# Patient Record
Sex: Female | Born: 1955
Health system: Southern US, Community
[De-identification: ages and names within clinical notes are randomized; demographics above are authoritative.]

## PROBLEM LIST (undated history)

## (undated) DIAGNOSIS — I10 Essential (primary) hypertension: Secondary | ICD-10-CM

## (undated) DIAGNOSIS — R112 Nausea with vomiting, unspecified: Secondary | ICD-10-CM

## (undated) DIAGNOSIS — E785 Hyperlipidemia, unspecified: Secondary | ICD-10-CM

## (undated) DIAGNOSIS — K635 Polyp of colon: Secondary | ICD-10-CM

## (undated) DIAGNOSIS — Z9889 Other specified postprocedural states: Secondary | ICD-10-CM

## (undated) DIAGNOSIS — M1990 Unspecified osteoarthritis, unspecified site: Secondary | ICD-10-CM

## (undated) HISTORY — DX: Essential (primary) hypertension: I10

## (undated) HISTORY — PX: LESION REMOVAL: SHX5196

## (undated) HISTORY — PX: CATARACT EXTRACTION, BILATERAL: SHX1313

## (undated) HISTORY — DX: Polyp of colon: K63.5

## (undated) HISTORY — DX: Hyperlipidemia, unspecified: E78.5

---

## 1993-12-08 HISTORY — PX: ABDOMINOPLASTY: SUR9

## 2006-06-16 ENCOUNTER — Ambulatory Visit: Payer: Self-pay | Admitting: Gastroenterology

## 2009-03-08 ENCOUNTER — Ambulatory Visit: Payer: Self-pay | Admitting: Gynecologic Oncology

## 2009-03-27 ENCOUNTER — Ambulatory Visit: Payer: Self-pay | Admitting: Gynecologic Oncology

## 2009-10-08 ENCOUNTER — Ambulatory Visit: Payer: Self-pay | Admitting: Gynecologic Oncology

## 2009-10-09 ENCOUNTER — Ambulatory Visit: Payer: Self-pay | Admitting: Gynecologic Oncology

## 2009-11-07 ENCOUNTER — Ambulatory Visit: Payer: Self-pay | Admitting: Gynecologic Oncology

## 2010-04-07 ENCOUNTER — Ambulatory Visit: Payer: Self-pay | Admitting: Gynecologic Oncology

## 2010-04-09 ENCOUNTER — Ambulatory Visit: Payer: Self-pay | Admitting: Gynecologic Oncology

## 2010-05-08 ENCOUNTER — Ambulatory Visit: Payer: Self-pay | Admitting: Gynecologic Oncology

## 2010-10-22 ENCOUNTER — Ambulatory Visit: Payer: Self-pay | Admitting: Gynecologic Oncology

## 2010-11-07 ENCOUNTER — Ambulatory Visit: Payer: Self-pay | Admitting: Gynecologic Oncology

## 2011-03-10 ENCOUNTER — Ambulatory Visit: Payer: Self-pay | Admitting: Surgery

## 2012-03-09 ENCOUNTER — Ambulatory Visit: Payer: Self-pay | Admitting: Gynecologic Oncology

## 2012-04-07 ENCOUNTER — Ambulatory Visit: Payer: Self-pay | Admitting: Gynecologic Oncology

## 2012-09-07 ENCOUNTER — Ambulatory Visit: Payer: Self-pay | Admitting: Gynecologic Oncology

## 2012-10-08 ENCOUNTER — Ambulatory Visit: Payer: Self-pay | Admitting: Gynecologic Oncology

## 2012-11-03 ENCOUNTER — Ambulatory Visit: Payer: Self-pay | Admitting: Unknown Physician Specialty

## 2012-11-03 LAB — HM COLONOSCOPY: HM Colonoscopy: NORMAL

## 2013-03-08 ENCOUNTER — Ambulatory Visit: Payer: Self-pay | Admitting: Gynecologic Oncology

## 2013-04-07 ENCOUNTER — Ambulatory Visit: Payer: Self-pay | Admitting: Gynecologic Oncology

## 2013-09-05 ENCOUNTER — Ambulatory Visit: Payer: Self-pay | Admitting: Surgery

## 2013-09-13 ENCOUNTER — Ambulatory Visit: Payer: Self-pay | Admitting: Gynecologic Oncology

## 2013-10-08 ENCOUNTER — Ambulatory Visit: Payer: Self-pay | Admitting: Gynecologic Oncology

## 2013-12-04 LAB — HM MAMMOGRAPHY: HM Mammogram: NORMAL

## 2014-01-12 ENCOUNTER — Ambulatory Visit: Payer: Self-pay | Admitting: Specialist

## 2014-04-04 LAB — HM PAP SMEAR

## 2014-04-24 ENCOUNTER — Ambulatory Visit: Payer: Self-pay | Admitting: Gynecologic Oncology

## 2014-05-08 ENCOUNTER — Ambulatory Visit: Payer: Self-pay | Admitting: Gynecologic Oncology

## 2014-09-04 ENCOUNTER — Ambulatory Visit (INDEPENDENT_AMBULATORY_CARE_PROVIDER_SITE_OTHER): Payer: BC Managed Care – PPO | Admitting: Internal Medicine

## 2014-09-04 ENCOUNTER — Encounter: Payer: Self-pay | Admitting: Internal Medicine

## 2014-09-04 VITALS — BP 140/94 | HR 65 | Temp 98.7°F | Resp 16 | Ht 62.75 in | Wt 139.5 lb

## 2014-09-04 DIAGNOSIS — R5381 Other malaise: Secondary | ICD-10-CM

## 2014-09-04 DIAGNOSIS — Z79899 Other long term (current) drug therapy: Secondary | ICD-10-CM

## 2014-09-04 DIAGNOSIS — Z83719 Family history of colon polyps, unspecified: Secondary | ICD-10-CM

## 2014-09-04 DIAGNOSIS — R5383 Other fatigue: Secondary | ICD-10-CM

## 2014-09-04 DIAGNOSIS — Z8371 Family history of colonic polyps: Secondary | ICD-10-CM

## 2014-09-04 DIAGNOSIS — Z111 Encounter for screening for respiratory tuberculosis: Secondary | ICD-10-CM

## 2014-09-04 DIAGNOSIS — L309 Dermatitis, unspecified: Secondary | ICD-10-CM

## 2014-09-04 DIAGNOSIS — E785 Hyperlipidemia, unspecified: Secondary | ICD-10-CM

## 2014-09-04 DIAGNOSIS — I10 Essential (primary) hypertension: Secondary | ICD-10-CM

## 2014-09-04 DIAGNOSIS — L259 Unspecified contact dermatitis, unspecified cause: Secondary | ICD-10-CM

## 2014-09-04 DIAGNOSIS — E559 Vitamin D deficiency, unspecified: Secondary | ICD-10-CM

## 2014-09-04 DIAGNOSIS — N870 Mild cervical dysplasia: Secondary | ICD-10-CM

## 2014-09-04 DIAGNOSIS — Z1159 Encounter for screening for other viral diseases: Secondary | ICD-10-CM

## 2014-09-04 DIAGNOSIS — Z8739 Personal history of other diseases of the musculoskeletal system and connective tissue: Secondary | ICD-10-CM

## 2014-09-04 DIAGNOSIS — L439 Lichen planus, unspecified: Secondary | ICD-10-CM | POA: Insufficient documentation

## 2014-09-04 MED ORDER — MONTELUKAST SODIUM 10 MG PO TABS: 10.0000 mg | ORAL_TABLET | Freq: Every day | ORAL | Status: DC

## 2014-09-04 NOTE — Progress Notes (Signed)
Patient ID: Maria Kemp, female   DOB: Dec 21, 1955, 58 y.o.   MRN: [id number] Patient Active Problem List   Diagnosis Date Noted  . Dermatitis 09/04/2014  . Essential hypertension, benign 09/04/2014  . Mild cervical dysplasia 09/04/2014  . Family history of colonic polyps 09/04/2014  . H/O rotator cuff tear 09/04/2014    Subjective:  CC:   Chief Complaint  Patient presents with  . Establish Care    HPI:   Maria Lemaster Crispis a 58 y.o. female who presents with Recurrent dermatitis of unknown etiology.    History:  Her symptoms have been present for years.   Initially thought her symptoms were due to medications, namely her BP medication. Thought the the Caduet generic caused itching , which failed to resolve with resuming of  name brand.  Currently taking two solo meds for hypertension and still itching.  Has had two prior dermatology evaluations by community based specialists,  First evaluation was at [facility] with Dr. Diona Browner, 2nd  opinion from Dr.  Tinnie Gens Scales who did the first  Biopsy of a papule on her abdominal wall . Path report was  hive.  She disagrees and thinks  she may have psoriasis,. Has been using topical  Halog in large amounts due to mutlipel paules covering arms legs and abdomen.     Has been noting hair loss on her legs and arms over the past year.  Scalp hair is also thinning   Was diagnosed with lichen planus in dentistry school , Has also had recurrent excisional surgeries to remove the calcified sebaceous cysts.,  5 total which have left considerable scarring .  The cysts do exude a cheesy exudate.  She itches constantly.  Welts become hyperpigmented and raised bu never resolve. .   Has tried taking allegra for the itching but doesn't want to do that daily .   No FH of skin cancers.  Maternal aunt had psoriasis ,  And asthma.       Past Medical History  Diagnosis Date  . Hypertension   . Hyperlipidemia   . Colon polyps    Allergies   Allergen Reactions  . Codeine Anaphylaxis  . Shrimp [Shellfish Allergy] Anaphylaxis    All shell fish     Past Surgical History  Procedure Laterality Date  . Abdominoplasty  1995  . Cesarean section  1989    History   Social History  . Marital Status: Single    Spouse Name: N/A    Number of Children: N/A  . Years of Education: N/A   Occupational History  . Not on file.   Social History Main Topics  . Smoking status: Never Smoker   . Smokeless tobacco: Never Used  . Alcohol Use: Yes     Comment: rarely  . Drug Use: No  . Sexual Activity: Not on file   Other Topics Concern  . Not on file   Social History Narrative  . No narrative on file   Outpatient Encounter Prescriptions as of 09/04/2014  Medication Sig  . atorvastatin (LIPITOR) 20 MG tablet Take 20 mg by mouth daily.  . Halcinonide (HALOG) 0.1 % CREA Apply 1 application topically 2 (two) times daily.  Marland Kitchen losartan (COZAAR) 25 MG tablet Take 25 mg by mouth daily.  . montelukast (SINGULAIR) 10 MG tablet Take 1 tablet (10 mg total) by mouth at bedtime.  . Triamcinolone Acetonide (NASACORT ALLERGY 24HR NA) Place 1 spray into the nose daily.  Review of Systems:   The rest of the review of systems was negative except those addressed in the HPI.      Objective:  BP 140/94  Pulse 65  Temp(Src) 98.7 F (37.1 C) (Oral)  Resp 16  Ht 5' 2.75" (1.594 m)  Wt 139 lb 8 oz (63.277 kg)  BMI 24.90 kg/m2  SpO2 97%  General appearance: alert, cooperative and appears stated age Ears: normal TM's and external ear canals both ears Throat: lips, mucosa, and tongue normal; teeth and gums normal Neck: no adenopathy, no carotid bruit, supple, symmetrical, trachea midline and thyroid not enlarged, symmetric, no tenderness/mass/nodules Back: symmetric, no curvature. ROM normal. No CVA tenderness. Lungs: clear to auscultation bilaterally Heart: regular rate and rhythm, S1, S2 normal, no murmur, click, rub or  gallop Abdomen: soft, non-tender; bowel sounds normal; no masses,  no organomegaly Pulses: 2+ and symmetric Skin: Skin color, texture, turgor normal. No rashes or lesions Lymph nodes: Cervical, supraclavicular, and axillary nodes normal.  Assessment and Plan:  Dermatitis Patient has had multiple diagnoses in the past including lichen planus, hives, and calcified sebaceous cysts which have  Become quite large and problematic requiring excisional surgeries and scarring.  Referral to academic center for dermatology recommended and accepted,  Given her history of hair loss,  Will check thyroid, CBC and Vitamin D along with screening for hyperlipidemia and diabetes.   Essential hypertension, benign Currently managed with amlodipine and losartan.  Patient wants to resume caduet and will call with the prior dose   Mild cervical dysplasia She has been receiving semi annual PAP smears by Wendy Poet and is due next month.  She may return here if she has trouble obtaining appt   Family history of colonic polyps She is up to date on colonoscopy.   H/O rotator cuff tear Managed with PT, no surgery required.     Updated Medication List Outpatient Encounter Prescriptions as of 09/04/2014  Medication Sig  . atorvastatin (LIPITOR) 20 MG tablet Take 20 mg by mouth daily.  . Halcinonide (HALOG) 0.1 % CREA Apply 1 application topically 2 (two) times daily.  Marland Kitchen losartan (COZAAR) 25 MG tablet Take 25 mg by mouth daily.  . montelukast (SINGULAIR) 10 MG tablet Take 1 tablet (10 mg total) by mouth at bedtime.  . Triamcinolone Acetonide (NASACORT ALLERGY 24HR NA) Place 1 spray into the nose daily.     Orders Placed This Encounter  Procedures  . HM MAMMOGRAPHY  . HM PAP SMEAR  . CBC with Differential  . Comprehensive metabolic panel  . TSH  . Lipid panel  . Vit D  25 hydroxy (rtn osteoporosis monitoring)  . Hepatitis C antibody  . B12  . Quantiferon tb gold assay  . Ambulatory referral to  Dermatology  . HM COLONOSCOPY    No Follow-up on file.

## 2014-09-05 ENCOUNTER — Encounter: Payer: Self-pay | Admitting: Internal Medicine

## 2014-09-05 NOTE — Assessment & Plan Note (Signed)
Patient has had multiple diagnoses in the past including lichen planus, hives, and calcified sebaceous cysts which have  Become quite large and problematic requiring excisional surgeries and scarring.  Referral to academic center for dermatology recommended and accepted,  Given her history of hair loss,  Will check thyroid, CBC and Vitamin D along with screening for hyperlipidemia and diabetes.

## 2014-09-05 NOTE — Assessment & Plan Note (Signed)
She has been receiving semi annual PAP smears by Wendy PoetBrigitte Miller and is due next month.  She may return here if she has trouble obtaining appt

## 2014-09-05 NOTE — Assessment & Plan Note (Signed)
Managed with PT, no surgery required.

## 2014-09-05 NOTE — Assessment & Plan Note (Signed)
Currently managed with amlodipine and losartan.  Patient wants to resume caduet and will call with the prior dose

## 2014-09-05 NOTE — Assessment & Plan Note (Signed)
She is up to date on colonoscopy.  

## 2014-09-07 ENCOUNTER — Other Ambulatory Visit (INDEPENDENT_AMBULATORY_CARE_PROVIDER_SITE_OTHER): Payer: BC Managed Care – PPO

## 2014-09-07 DIAGNOSIS — Z1159 Encounter for screening for other viral diseases: Secondary | ICD-10-CM

## 2014-09-07 DIAGNOSIS — Z111 Encounter for screening for respiratory tuberculosis: Secondary | ICD-10-CM

## 2014-09-07 DIAGNOSIS — Z79899 Other long term (current) drug therapy: Secondary | ICD-10-CM

## 2014-09-07 DIAGNOSIS — E559 Vitamin D deficiency, unspecified: Secondary | ICD-10-CM

## 2014-09-07 DIAGNOSIS — R5383 Other fatigue: Secondary | ICD-10-CM

## 2014-09-07 DIAGNOSIS — E785 Hyperlipidemia, unspecified: Secondary | ICD-10-CM

## 2014-09-07 DIAGNOSIS — R5381 Other malaise: Secondary | ICD-10-CM

## 2014-09-07 LAB — CBC WITH DIFFERENTIAL/PLATELET
Basophils Absolute: 0 10*3/uL (ref 0.0–0.1)
Basophils Relative: 0.4 % (ref 0.0–3.0)
EOS ABS: 0.1 10*3/uL (ref 0.0–0.7)
EOS PCT: 2 % (ref 0.0–5.0)
HCT: 39 % (ref 36.0–46.0)
Hemoglobin: 12.9 g/dL (ref 12.0–15.0)
Lymphocytes Relative: 36 % (ref 12.0–46.0)
Lymphs Abs: 1 10*3/uL (ref 0.7–4.0)
MCHC: 33.2 g/dL (ref 30.0–36.0)
MCV: 89 fl (ref 78.0–100.0)
MONO ABS: 0.4 10*3/uL (ref 0.1–1.0)
Monocytes Relative: 13.1 % — ABNORMAL HIGH (ref 3.0–12.0)
Neutro Abs: 1.3 10*3/uL — ABNORMAL LOW (ref 1.4–7.7)
Neutrophils Relative %: 48.5 % (ref 43.0–77.0)
Platelets: 263 10*3/uL (ref 150.0–400.0)
RBC: 4.39 Mil/uL (ref 3.87–5.11)
RDW: 15.2 % (ref 11.5–15.5)
WBC: 2.8 10*3/uL — ABNORMAL LOW (ref 4.0–10.5)

## 2014-09-07 LAB — COMPREHENSIVE METABOLIC PANEL
ALBUMIN: 4 g/dL (ref 3.5–5.2)
ALT: 14 U/L (ref 0–35)
AST: 20 U/L (ref 0–37)
Alkaline Phosphatase: 63 U/L (ref 39–117)
BUN: 12 mg/dL (ref 6–23)
CO2: 29 mEq/L (ref 19–32)
Calcium: 9.3 mg/dL (ref 8.4–10.5)
Chloride: 105 mEq/L (ref 96–112)
Creatinine, Ser: 0.8 mg/dL (ref 0.4–1.2)
GFR: 91.74 mL/min (ref >60.00)
GLUCOSE: 103 mg/dL — AB (ref 70–99)
Potassium: 4.2 mEq/L (ref 3.5–5.1)
SODIUM: 140 meq/L (ref 135–145)
TOTAL PROTEIN: 7.2 g/dL (ref 6.0–8.3)
Total Bilirubin: 0.7 mg/dL (ref 0.2–1.2)

## 2014-09-07 LAB — LIPID PANEL
Cholesterol: 157 mg/dL (ref 0–200)
HDL: 48.3 mg/dL (ref >39.00)
LDL CALC: 95 mg/dL (ref 0–99)
NONHDL: 108.7
Total CHOL/HDL Ratio: 3
Triglycerides: 68 mg/dL (ref 0.0–149.0)
VLDL: 13.6 mg/dL (ref 0.0–40.0)

## 2014-09-07 LAB — VITAMIN D 25 HYDROXY (VIT D DEFICIENCY, FRACTURES): VITD: 24.24 ng/mL — AB (ref 30.00–100.00)

## 2014-09-07 LAB — TSH: TSH: 0.69 u[IU]/mL (ref 0.35–4.50)

## 2014-09-07 LAB — VITAMIN B12: Vitamin B-12: 281 pg/mL (ref 211–911)

## 2014-09-08 ENCOUNTER — Telehealth: Payer: Self-pay | Admitting: Internal Medicine

## 2014-09-08 LAB — HEPATITIS C ANTIBODY: HCV Ab: NEGATIVE

## 2014-09-08 MED ORDER — AMLODIPINE-ATORVASTATIN 5-10 MG PO TABS: 1.0000 | ORAL_TABLET | Freq: Every day | ORAL | Status: DC

## 2014-09-08 NOTE — Telephone Encounter (Signed)
Ok to fill the caduet, I willupdate the chart and send it  Once the referral has been made to dermatology, she can call them directly to change the appt time

## 2014-09-08 NOTE — Telephone Encounter (Signed)
Patient stated her Caduet is 5 mg amlodipine and 10 mg lipitor ok to fill? Also need to reschedule patient dermatology appointment due to that being the one day she has no coverage.

## 2014-09-09 LAB — QUANTIFERON TB GOLD ASSAY (BLOOD)
INTERFERON GAMMA RELEASE ASSAY: NEGATIVE
Mitogen value: 9.12 IU/mL
QUANTIFERON NIL VALUE: 0.02 [IU]/mL
QUANTIFERON TB AG MINUS NIL: 0.01 [IU]/mL
TB AG VALUE: 0.03 [IU]/mL

## 2014-09-11 ENCOUNTER — Encounter: Payer: Self-pay | Admitting: *Deleted

## 2014-09-12 ENCOUNTER — Telehealth: Payer: Self-pay | Admitting: Internal Medicine

## 2014-09-12 NOTE — Telephone Encounter (Signed)
Left message for patient to return call to office. 

## 2014-09-12 NOTE — Telephone Encounter (Signed)
Patient stated that since labs were OK what other reason could be for itching patient has become worse and appointment for dermatology is week or two off please advise have any other advice for the itching.

## 2014-09-12 NOTE — Telephone Encounter (Signed)
I do not know , that's why I am sending her to dermatology..  If she would

## 2014-09-12 NOTE — Telephone Encounter (Signed)
If she would like to try hydroxyzine 25 mg every 8 hours you can call in. #90.  Try Eucerin skin cream as well

## 2014-09-13 MED ORDER — AMLODIPINE-ATORVASTATIN 5-10 MG PO TABS: 1.0000 | ORAL_TABLET | Freq: Every day | ORAL | Status: DC

## 2014-09-13 NOTE — Telephone Encounter (Signed)
Spoke with pt, she states she does not want to take Hydroxyzine.  She will use Eucerin Cream.  Pt also wanted to remind you that she mentioned before that the itching started in December when she started Caduet Generic.Marland Kitchen. She wonders if she should take brand medication instead.  Please advise

## 2014-09-13 NOTE — Addendum Note (Signed)
Addended by: Sherlene ShamsULLO, Raini Tiley L on: 09/13/2014 10:18 AM   Modules accepted: Orders

## 2014-09-13 NOTE — Telephone Encounter (Signed)
We can try the brand name , .i will send in

## 2014-09-13 NOTE — Telephone Encounter (Signed)
Spoke with pt, advised of Rx change.  Pt verbalized understanding 

## 2014-09-20 ENCOUNTER — Telehealth: Payer: Self-pay

## 2014-09-20 NOTE — Telephone Encounter (Signed)
Left message for patient to return call to office. 

## 2014-09-20 NOTE — Telephone Encounter (Signed)
The patient called hoping to get a non-generic rx called in for amlodipine   Callback [phone]

## 2014-09-21 NOTE — Telephone Encounter (Signed)
Patient only wanted to confirm that caduet was called to pharmacy.

## 2014-10-04 LAB — HM PAP SMEAR

## 2014-11-01 ENCOUNTER — Ambulatory Visit: Payer: Self-pay

## 2014-11-07 ENCOUNTER — Ambulatory Visit: Payer: Self-pay

## 2014-11-27 ENCOUNTER — Other Ambulatory Visit: Payer: Self-pay | Admitting: Internal Medicine

## 2015-01-15 LAB — HM MAMMOGRAPHY: HM Mammogram: NEGATIVE

## 2015-03-05 ENCOUNTER — Ambulatory Visit (INDEPENDENT_AMBULATORY_CARE_PROVIDER_SITE_OTHER): Payer: BLUE CROSS/BLUE SHIELD | Admitting: Internal Medicine

## 2015-03-05 ENCOUNTER — Encounter: Payer: Self-pay | Admitting: Internal Medicine

## 2015-03-05 VITALS — BP 128/84 | HR 76 | Temp 98.5°F | Resp 14 | Ht 63.0 in | Wt 137.5 lb

## 2015-03-05 DIAGNOSIS — E538 Deficiency of other specified B group vitamins: Secondary | ICD-10-CM | POA: Diagnosis not present

## 2015-03-05 DIAGNOSIS — L439 Lichen planus, unspecified: Secondary | ICD-10-CM

## 2015-03-05 DIAGNOSIS — L299 Pruritus, unspecified: Secondary | ICD-10-CM | POA: Diagnosis not present

## 2015-03-05 DIAGNOSIS — E785 Hyperlipidemia, unspecified: Secondary | ICD-10-CM | POA: Diagnosis not present

## 2015-03-05 DIAGNOSIS — I10 Essential (primary) hypertension: Secondary | ICD-10-CM

## 2015-03-05 DIAGNOSIS — F411 Generalized anxiety disorder: Secondary | ICD-10-CM | POA: Diagnosis not present

## 2015-03-05 DIAGNOSIS — E559 Vitamin D deficiency, unspecified: Secondary | ICD-10-CM | POA: Diagnosis not present

## 2015-03-05 MED ORDER — CADUET 5-10 MG PO TABS: 1.0000 | ORAL_TABLET | Freq: Every day | ORAL | Status: DC

## 2015-03-05 NOTE — Patient Instructions (Addendum)
Fasting labs today   meds refilled with name brand only caduet   Try [employer]'s  Pasta  For low carb (made from [place]ina so tastes great)   I Agree with prenatal vitamins  and probiotics.  Just don't get any with iron.    Call if you need an emergency travel package   We'll do your DEX scan after age 60

## 2015-03-05 NOTE — Assessment & Plan Note (Addendum)
Has been diagnosed with LP and folliculitis and [facility]  She was treated with clindamycin and folliculitis resolved,  Symptoms have all resolved.

## 2015-03-05 NOTE — Progress Notes (Signed)
Patient ID: Maria Kemp, female   DOB: 19-Aug-1956, 59 y.o.   MRN: [id number]   Patient Active Problem List   Diagnosis Date Noted  . Pruritus 03/05/2015  . Lichen planus 09/04/2014  . Essential hypertension, benign 09/04/2014  . Mild cervical dysplasia 09/04/2014  . Family history of colonic polyps 09/04/2014  . H/O rotator cuff tear 09/04/2014    Subjective:  CC:   Chief Complaint  Patient presents with  . Follow-up    general     HPI:   Maria Kemp is a 59 y.o. female who presents for   6 month follow up on chronci issues include persistent dermatitis.  With pruritis, and hypertension.  She was referred to [facility]  And LP was confirmed, complicated by folliculitis.  She was treated nad her pruritus ad dermatitis have resolved,  She continues to develop painful subcutaneous nodules that require surgical excision and has to on the extensor surface of right forearm    [facility] is managing her cervical dysplasia.  She is taking her BP medications without side effects or incident.    She is inquiring about vitamins to improve hair  quality and texture.   Past Medical History  Diagnosis Date  . Hypertension   . Hyperlipidemia   . Colon polyps     Past Surgical History  Procedure Laterality Date  . Abdominoplasty  1995  . Cesarean section  1989       The following portions of the patient's history were reviewed and updated as appropriate: Allergies, current medications, and problem list.    Review of Systems:   Patient denies headache, fevers, malaise, unintentional weight loss, skin rash, eye pain, sinus congestion and sinus pain, sore throat, dysphagia,  hemoptysis , cough, dyspnea, wheezing, chest pain, palpitations, orthopnea, edema, abdominal pain, nausea, melena, diarrhea, constipation, flank pain, dysuria, hematuria, urinary  Frequency, nocturia, numbness, tingling, seizures,  Focal weakness, Loss of consciousness,  Tremor,  insomnia, depression, anxiety, and suicidal ideation.     History   Social History  . Marital Status: Divorced    Spouse Name: N/A  . Number of Children: N/A  . Years of Education: N/A   Occupational History  . Not on file.   Social History Main Topics  . Smoking status: Never Smoker   . Smokeless tobacco: Never Used  . Alcohol Use: Yes     Comment: rarely  . Drug Use: No  . Sexual Activity: Not on file   Other Topics Concern  . Not on file   Social History Narrative    Objective:  Filed Vitals:   03/05/15 1606  BP: 128/84  Pulse: 76  Temp: 98.5 F (36.9 C)  Resp: 14     General appearance: alert, cooperative and appears stated age Ears: normal TM's and external ear canals both ears Throat: lips, mucosa, and tongue normal; teeth and gums normal Neck: no adenopathy, no carotid bruit, supple, symmetrical, trachea midline and thyroid not enlarged, symmetric, no tenderness/mass/nodules Back: symmetric, no curvature. ROM normal. No CVA tenderness. Lungs: clear to auscultation bilaterally Heart: regular rate and rhythm, S1, S2 normal, no murmur, click, rub or gallop Abdomen: soft, non-tender; bowel sounds normal; no masses,  no organomegaly Pulses: 2+ and symmetric Skin: Skin color, texture, turgor normal. No rashes or lesions Lymph nodes: Cervical, supraclavicular, and axillary nodes normal.  Assessment and Plan:  Lichen planus Has been diagnosed with LP and folliculitis and treated by [facility].  She was treated  with clindamycin and folliculitis resolved,  Symptoms have all resolved.    Essential hypertension, benign Well controlled on current regimen. Renal function stable, no changes today.  Lab Results  Component Value Date   CREATININE 0.85 03/05/2015   Lab Results  Component Value Date   NA 139 03/05/2015   K 4.1 03/05/2015   CL 102 03/05/2015   CO2 28 03/05/2015      A total of 25 minutes of face to face time was spent with patient  more than half of which was spent in counselling about the above mentioned conditions  and coordination of care   Updated Medication List Outpatient Encounter Prescriptions as of 03/05/2015  Medication Sig  . CADUET 5-10 MG per tablet Take 1 tablet by mouth daily.  . fexofenadine (ALLEGRA) 180 MG tablet Take 180 mg by mouth daily.  . Triamcinolone Acetonide (NASACORT ALLERGY 24HR NA) Place 1 spray into the nose daily.  . [DISCONTINUED] CADUET 5-10 MG per tablet TAKE 1 TABLET BY MOUTH DAILY.  . [DISCONTINUED] atorvastatin (LIPITOR) 20 MG tablet Take 20 mg by mouth daily.  . [DISCONTINUED] Halcinonide (HALOG) 0.1 % CREA Apply 1 application topically 2 (two) times daily.  . [DISCONTINUED] losartan (COZAAR) 25 MG tablet Take 25 mg by mouth daily.  . [DISCONTINUED] montelukast (SINGULAIR) 10 MG tablet Take 1 tablet (10 mg total) by mouth at bedtime. (Patient not taking: Reported on 03/05/2015)     Orders Placed This Encounter  Procedures  . HM PAP SMEAR  . Comprehensive metabolic panel  . Lipid panel  . B12  . Methylmalonic Acid  . Vit D  25 hydroxy (rtn osteoporosis monitoring)  . Ambulatory referral to Psychology    No Follow-up on file.

## 2015-03-05 NOTE — Progress Notes (Signed)
Pre-visit discussion using our clinic review tool. No additional management support is needed unless otherwise documented below in the visit note.  

## 2015-03-06 LAB — COMPREHENSIVE METABOLIC PANEL
ALT: 20 U/L (ref 0–35)
AST: 22 U/L (ref 0–37)
Albumin: 4.2 g/dL (ref 3.5–5.2)
Alkaline Phosphatase: 67 U/L (ref 39–117)
BILIRUBIN TOTAL: 0.3 mg/dL (ref 0.2–1.2)
BUN: 13 mg/dL (ref 6–23)
CO2: 28 mEq/L (ref 19–32)
CREATININE: 0.85 mg/dL (ref 0.40–1.20)
Calcium: 9.7 mg/dL (ref 8.4–10.5)
Chloride: 102 mEq/L (ref 96–112)
GFR: 87.86 mL/min (ref >60.00)
GLUCOSE: 87 mg/dL (ref 70–99)
Potassium: 4.1 mEq/L (ref 3.5–5.1)
SODIUM: 139 meq/L (ref 135–145)
TOTAL PROTEIN: 7.5 g/dL (ref 6.0–8.3)

## 2015-03-06 LAB — LIPID PANEL
CHOL/HDL RATIO: 3
Cholesterol: 179 mg/dL (ref 0–200)
HDL: 62 mg/dL (ref >39.00)
LDL Cholesterol: 102 mg/dL — ABNORMAL HIGH (ref 0–99)
NonHDL: 117
Triglycerides: 75 mg/dL (ref 0.0–149.0)
VLDL: 15 mg/dL (ref 0.0–40.0)

## 2015-03-06 LAB — VITAMIN B12: Vitamin B-12: 267 pg/mL (ref 211–911)

## 2015-03-06 LAB — VITAMIN D 25 HYDROXY (VIT D DEFICIENCY, FRACTURES): VITD: 25.88 ng/mL — ABNORMAL LOW (ref 30.00–100.00)

## 2015-03-07 NOTE — Assessment & Plan Note (Signed)
Well controlled on current regimen. Renal function stable, no changes today.  Lab Results  Component Value Date   CREATININE 0.85 03/05/2015   Lab Results  Component Value Date   NA 139 03/05/2015   K 4.1 03/05/2015   CL 102 03/05/2015   CO2 28 03/05/2015

## 2015-03-08 LAB — METHYLMALONIC ACID, SERUM: Methylmalonic Acid, Quant: 126 nmol/L (ref 87–318)

## 2015-03-12 ENCOUNTER — Encounter: Payer: Self-pay | Admitting: *Deleted

## 2015-06-25 ENCOUNTER — Telehealth: Payer: Self-pay | Admitting: Internal Medicine

## 2015-06-25 NOTE — Telephone Encounter (Signed)
Pt dropped off letter. Letter in Dr. Tullo's box/msn °

## 2015-06-26 NOTE — Telephone Encounter (Signed)
IN today's red folder.

## 2015-06-27 ENCOUNTER — Telehealth: Payer: Self-pay | Admitting: Internal Medicine

## 2015-06-27 NOTE — Telephone Encounter (Signed)
Patient notified and voiced understanding.

## 2015-06-27 NOTE — Telephone Encounter (Signed)
Her re accrediation form has been signed and is on its way back to her in the envelope she provided

## 2015-09-05 ENCOUNTER — Encounter: Payer: Self-pay | Admitting: Internal Medicine

## 2015-09-05 ENCOUNTER — Ambulatory Visit (INDEPENDENT_AMBULATORY_CARE_PROVIDER_SITE_OTHER): Payer: BLUE CROSS/BLUE SHIELD | Admitting: Internal Medicine

## 2015-09-05 VITALS — BP 120/78 | HR 76 | Temp 98.4°F | Resp 12 | Ht 63.0 in | Wt 141.0 lb

## 2015-09-05 DIAGNOSIS — I10 Essential (primary) hypertension: Secondary | ICD-10-CM | POA: Diagnosis not present

## 2015-09-05 DIAGNOSIS — Z79899 Other long term (current) drug therapy: Secondary | ICD-10-CM

## 2015-09-05 DIAGNOSIS — Z872 Personal history of diseases of the skin and subcutaneous tissue: Secondary | ICD-10-CM

## 2015-09-05 DIAGNOSIS — E785 Hyperlipidemia, unspecified: Secondary | ICD-10-CM

## 2015-09-05 DIAGNOSIS — Z23 Encounter for immunization: Secondary | ICD-10-CM | POA: Diagnosis not present

## 2015-09-05 DIAGNOSIS — E876 Hypokalemia: Secondary | ICD-10-CM

## 2015-09-05 NOTE — Progress Notes (Signed)
Subjective:  Patient ID: Maria Kemp, female    DOB: 03/28/1956  Age: 59 y.o. MRN: [id number]  CC: The primary encounter diagnosis was Encounter for immunization. Diagnoses of Long-term use of high-risk medication, H/O sebaceous cyst, Essential hypertension, benign, and Hyperlipidemia LDL goal <130 were also pertinent to this visit.  HPI Maria Kemp presents for follow up on hypertension, hyperlipidemia .  Patient is taking her medications as prescribed and notes no adverse effects.  Home BP readings have been done about once per week and are  generally < 130/80 .  She is avoiding added salt in her diet and working out 5 days per week at a [employer]. Her diet is excellent and includes almond milk , greens and keffir milk, She eats red meat rarely,  chicken and fish 2-3 times per week.     Outpatient Prescriptions Prior to Visit  Medication Sig Dispense Refill  . CADUET 5-10 MG per tablet Take 1 tablet by mouth daily. 30 tablet 5  . fexofenadine (ALLEGRA) 180 MG tablet Take 180 mg by mouth daily.    . Triamcinolone Acetonide (NASACORT ALLERGY 24HR NA) Place 1 spray into the nose daily.     No facility-administered medications prior to visit.    Review of Systems;  Patient denies headache, fevers, malaise, unintentional weight loss, skin rash, eye pain, sinus congestion and sinus pain, sore throat, dysphagia,  hemoptysis , cough, dyspnea, wheezing, chest pain, palpitations, orthopnea, edema, abdominal pain, nausea, melena, diarrhea, constipation, flank pain, dysuria, hematuria, urinary  Frequency, nocturia, numbness, tingling, seizures,  Focal weakness, Loss of consciousness,  Tremor, insomnia, depression, anxiety, and suicidal ideation.      Objective:  BP 120/78 mmHg  Pulse 76  Temp(Src) 98.4 F (36.9 C) (Oral)  Resp 12  Ht '5\' 3"'  (1.6 m)  Wt 141 lb (63.957 kg)  BMI 24.98 kg/m2  SpO2 98%  BP Readings from Last 3 Encounters:  09/05/15 120/78  03/05/15 128/84  09/04/14  140/94    Wt Readings from Last 3 Encounters:  09/05/15 141 lb (63.957 kg)  03/05/15 137 lb 8 oz (62.37 kg)  09/04/14 139 lb 8 oz (63.277 kg)    General appearance: alert, cooperative and appears stated age Ears: normal TM's and external ear canals both ears Throat: lips, mucosa, and tongue normal; teeth and gums normal Neck: no adenopathy, no carotid bruit, supple, symmetrical, trachea midline and thyroid not enlarged, symmetric, no tenderness/mass/nodules Back: symmetric, no curvature. ROM normal. No CVA tenderness. Lungs: clear to auscultation bilaterally Heart: regular rate and rhythm, S1, S2 normal, no murmur, click, rub or gallop Abdomen: soft, non-tender; bowel sounds normal; no masses,  no organomegaly Pulses: 2+ and symmetric Skin: Skin color, texture, turgor normal. No rashes or lesions Lymph nodes: Cervical, supraclavicular, and axillary nodes normal.  No results found for: HGBA1C  Lab Results  Component Value Date   CREATININE 0.85 03/05/2015   CREATININE 0.8 09/07/2014    Lab Results  Component Value Date   WBC 2.8* 09/07/2014   HGB 12.9 09/07/2014   HCT 39.0 09/07/2014   PLT 263.0 09/07/2014   GLUCOSE 87 03/05/2015   CHOL 179 03/05/2015   TRIG 75.0 03/05/2015   HDL 62.00 03/05/2015   LDLCALC 102* 03/05/2015   ALT 20 03/05/2015   AST 22 03/05/2015   NA 139 03/05/2015   K 4.1 03/05/2015   CL 102 03/05/2015   CREATININE 0.85 03/05/2015   BUN 13 03/05/2015   CO2 28 03/05/2015  TSH 0.69 09/07/2014    No results found.  Assessment & Plan:   Problem List Items Addressed This Visit    Essential hypertension, benign    Well controlled on current regimen. Renal function stable, no changes today.   Lab Results  Component Value Date   CREATININE 0.85 03/05/2015   Lab Results  Component Value Date   NA 139 03/05/2015   K 4.1 03/05/2015   CL 102 03/05/2015   CO2 28 03/05/2015         H/O sebaceous cyst    Recurrent, calcified ,  Removed  surgically from extensor surface of right forearm resulting in significant scarring       Hyperlipidemia LDL goal <130    LDL and triglycerides are at goal on 10 mg atorvastatin  (Caduet) . She has no side effects and will return for fasting labs and liver enzymes . No changes today.  Lab Results  Component Value Date   CHOL 179 03/05/2015   HDL 62.00 03/05/2015   LDLCALC 102* 03/05/2015   TRIG 75.0 03/05/2015   CHOLHDL 3 03/05/2015   Lab Results  Component Value Date   ALT 20 03/05/2015   AST 22 03/05/2015   ALKPHOS 67 03/05/2015   BILITOT 0.3 03/05/2015           Other Visit Diagnoses    Encounter for immunization    -  Primary    Long-term use of high-risk medication        Relevant Orders    Comp Met (CMET)       I am having Ms. Duggan maintain her Triamcinolone Acetonide (NASACORT ALLERGY 24HR NA), fexofenadine, CADUET, calcium citrate, calcium-vitamin D, vitamin C, Cholecalciferol, and multivitamin.  Meds ordered this encounter  Medications  . calcium citrate (CALCITRATE - DOSED IN MG ELEMENTAL CALCIUM) 950 MG tablet    Sig: Take 200 mg of elemental calcium by mouth daily.  . calcium-vitamin D (OSCAL WITH D) 500-200 MG-UNIT tablet    Sig: Take 1 tablet by mouth daily with breakfast.  . vitamin C (ASCORBIC ACID) 500 MG tablet    Sig: Take 500 mg by mouth daily.  . Cholecalciferol (D3-1000) 1000 UNITS tablet    Sig: Take 1,000 Units by mouth daily.  . Multiple Vitamin (MULTIVITAMIN) tablet    Sig: Take 1 tablet by mouth daily. Hair skin and nails    There are no discontinued medications.  Follow-up: Return in about 6 months (around 03/04/2016).   Crecencio Mc, MD

## 2015-09-05 NOTE — Progress Notes (Signed)
Pre-visit discussion using our clinic review tool. No additional management support is needed unless otherwise documented below in the visit note.  

## 2015-09-06 ENCOUNTER — Other Ambulatory Visit (INDEPENDENT_AMBULATORY_CARE_PROVIDER_SITE_OTHER): Payer: BLUE CROSS/BLUE SHIELD

## 2015-09-06 ENCOUNTER — Encounter: Payer: Self-pay | Admitting: Internal Medicine

## 2015-09-06 DIAGNOSIS — Z872 Personal history of diseases of the skin and subcutaneous tissue: Secondary | ICD-10-CM | POA: Insufficient documentation

## 2015-09-06 DIAGNOSIS — E785 Hyperlipidemia, unspecified: Secondary | ICD-10-CM | POA: Insufficient documentation

## 2015-09-06 DIAGNOSIS — Z79899 Other long term (current) drug therapy: Secondary | ICD-10-CM | POA: Diagnosis not present

## 2015-09-06 LAB — COMPREHENSIVE METABOLIC PANEL
ALBUMIN: 4.4 g/dL (ref 3.5–5.2)
ALK PHOS: 79 U/L (ref 39–117)
ALT: 19 U/L (ref 0–35)
AST: 21 U/L (ref 0–37)
BILIRUBIN TOTAL: 0.4 mg/dL (ref 0.2–1.2)
BUN: 11 mg/dL (ref 6–23)
CALCIUM: 9.7 mg/dL (ref 8.4–10.5)
CO2: 32 mEq/L (ref 19–32)
CREATININE: 0.8 mg/dL (ref 0.40–1.20)
Chloride: 102 mEq/L (ref 96–112)
GFR: 94.07 mL/min (ref >60.00)
Glucose, Bld: 87 mg/dL (ref 70–99)
Potassium: 3.1 mEq/L — ABNORMAL LOW (ref 3.5–5.1)
Sodium: 141 mEq/L (ref 135–145)
TOTAL PROTEIN: 7.5 g/dL (ref 6.0–8.3)

## 2015-09-06 NOTE — Assessment & Plan Note (Signed)
Recurrent, calcified ,  Removed surgically from extensor surface of right forearm resulting in significant scarring

## 2015-09-06 NOTE — Assessment & Plan Note (Signed)
LDL and triglycerides are at goal on 10 mg atorvastatin  (Caduet) . She has no side effects and will return for fasting labs and liver enzymes . No changes today.  Lab Results  Component Value Date   CHOL 179 03/05/2015   HDL 62.00 03/05/2015   LDLCALC 102* 03/05/2015   TRIG 75.0 03/05/2015   CHOLHDL 3 03/05/2015   Lab Results  Component Value Date   ALT 20 03/05/2015   AST 22 03/05/2015   ALKPHOS 67 03/05/2015   BILITOT 0.3 03/05/2015

## 2015-09-06 NOTE — Assessment & Plan Note (Signed)
Well controlled on current regimen. Renal function stable, no changes today.   Lab Results  Component Value Date   CREATININE 0.85 03/05/2015   Lab Results  Component Value Date   NA 139 03/05/2015   K 4.1 03/05/2015   CL 102 03/05/2015   CO2 28 03/05/2015

## 2015-09-09 MED ORDER — POTASSIUM CHLORIDE CRYS ER 20 MEQ PO TBCR: 20.0000 meq | EXTENDED_RELEASE_TABLET | Freq: Every day | ORAL | Status: DC

## 2015-09-09 NOTE — Addendum Note (Signed)
Addended by: Sherlene Shams on: 09/09/2015 06:13 PM   Modules accepted: Orders

## 2015-09-13 ENCOUNTER — Telehealth: Payer: Self-pay | Admitting: Internal Medicine

## 2015-09-13 NOTE — Telephone Encounter (Signed)
Patient ask if she can try raising potassium by diet and come back in a month to check levels. Patient stated she has a choking risk in swallowing the potassium tablets and she believes she can raise potassium by changing diet because she has not been eating potassium rich foods. Please advise.

## 2015-09-13 NOTE — Telephone Encounter (Signed)
Absolutely,  Dr Metta Clines is well informed and can try diet .  Labs ordered

## 2015-09-24 ENCOUNTER — Other Ambulatory Visit: Payer: BLUE CROSS/BLUE SHIELD

## 2015-10-07 ENCOUNTER — Other Ambulatory Visit: Payer: Self-pay | Admitting: Internal Medicine

## 2015-10-31 ENCOUNTER — Encounter (INDEPENDENT_AMBULATORY_CARE_PROVIDER_SITE_OTHER): Payer: Self-pay

## 2015-10-31 ENCOUNTER — Encounter: Payer: Self-pay | Admitting: Obstetrics and Gynecology

## 2015-10-31 ENCOUNTER — Inpatient Hospital Stay: Payer: BLUE CROSS/BLUE SHIELD | Attending: Obstetrics and Gynecology | Admitting: Obstetrics and Gynecology

## 2015-10-31 ENCOUNTER — Other Ambulatory Visit: Payer: Self-pay | Admitting: Obstetrics and Gynecology

## 2015-10-31 VITALS — BP 108/73 | HR 69 | Temp 97.9°F | Resp 20 | Wt 138.0 lb

## 2015-10-31 DIAGNOSIS — L439 Lichen planus, unspecified: Secondary | ICD-10-CM | POA: Insufficient documentation

## 2015-10-31 DIAGNOSIS — R87612 Low grade squamous intraepithelial lesion on cytologic smear of cervix (LGSIL): Secondary | ICD-10-CM

## 2015-10-31 DIAGNOSIS — L299 Pruritus, unspecified: Secondary | ICD-10-CM | POA: Insufficient documentation

## 2015-10-31 DIAGNOSIS — N870 Mild cervical dysplasia: Secondary | ICD-10-CM | POA: Diagnosis not present

## 2015-10-31 DIAGNOSIS — N898 Other specified noninflammatory disorders of vagina: Secondary | ICD-10-CM

## 2015-10-31 DIAGNOSIS — Z79899 Other long term (current) drug therapy: Secondary | ICD-10-CM | POA: Diagnosis not present

## 2015-10-31 DIAGNOSIS — I10 Essential (primary) hypertension: Secondary | ICD-10-CM | POA: Diagnosis not present

## 2015-10-31 DIAGNOSIS — E785 Hyperlipidemia, unspecified: Secondary | ICD-10-CM | POA: Insufficient documentation

## 2015-10-31 DIAGNOSIS — L723 Sebaceous cyst: Secondary | ICD-10-CM | POA: Diagnosis not present

## 2015-10-31 NOTE — Progress Notes (Addendum)
Gynecologic Oncology Visit   Referring Provider: Dr Arvil Chaco  Chief Concern: abnormal Pap smears  Subjective:  Maria Kemp is a 59 y.o. female who has a long history of slightly abnormal Pap smears, mainly LGSIL, often negative biopsies after colposcopy. Dr Arvil Chaco performed LEEP in 1999 and CKC in 2006 for cervical dysplasia.  Subsequent Pap smears vary between normal and LGSIL with high risk HPV.  Since 2013 her q6 month PAPs have shown ASCUS, Normal, LSIL, LSIL.  No HPV testing or colposcopy was done during that time period.  10/2014 she saw Dr. Johnnette Litter. Colposcopy was performed. The transformation zone was not visible, but there were no lesions. Pap NILM and HRHPV negative.   She presents for pelvic exam and Pap today. She has no complaints.    Problem List: Patient Active Problem List   Diagnosis Date Noted  . H/O sebaceous cyst 09/06/2015  . Hyperlipidemia LDL goal <130 09/06/2015  . Pruritus 03/05/2015  . Lichen planus 09/04/2014  . Essential hypertension, benign 09/04/2014  . Mild cervical dysplasia 09/04/2014  . Family history of colonic polyps 09/04/2014  . H/O rotator cuff tear 09/04/2014    Past Medical History: Past Medical History  Diagnosis Date  . Hypertension   . Hyperlipidemia   . Colon polyps     Past Surgical History: Past Surgical History  Procedure Laterality Date  . Abdominoplasty  1995  . Cesarean section  1989    Past Gynecologic History:  Menarche: 11 Menstrual details: postmenopausal  OB History: G3P2  Family History: Family History  Problem Relation Age of Onset  . Arthritis Mother   . Hyperlipidemia Mother   . Hypertension Mother   . Heart disease Mother   . Arthritis Father   . Cancer Father   . Hyperlipidemia Father   . Hypertension Father   . Heart disease Father   . Diabetes Father   . Hyperlipidemia Brother   . Hypertension Brother     Social History: Social History   Social History  . Marital Status:  Divorced    Spouse Name: N/A  . Number of Children: N/A  . Years of Education: N/A   Occupational History  . Not on file.   Social History Main Topics  . Smoking status: Never Smoker   . Smokeless tobacco: Never Used  . Alcohol Use: Yes     Comment: rarely  . Drug Use: No  . Sexual Activity: Not on file   Other Topics Concern  . Not on file   Social History Narrative    Allergies: Allergies  Allergen Reactions  . Codeine Anaphylaxis  . Shrimp [Shellfish Allergy] Anaphylaxis    All shell fish    Current Medications: Current Outpatient Prescriptions  Medication Sig Dispense Refill  . CADUET 5-10 MG per tablet Take 1 tablet by mouth daily. 30 tablet 5  . calcium citrate (CALCITRATE - DOSED IN MG ELEMENTAL CALCIUM) 950 MG tablet Take 200 mg of elemental calcium by mouth daily.    . calcium-vitamin D (OSCAL WITH D) 500-200 MG-UNIT tablet Take 1 tablet by mouth daily with breakfast.    . Cholecalciferol (D3-1000) 1000 UNITS tablet Take 1,000 Units by mouth daily.    . fexofenadine (ALLEGRA) 180 MG tablet Take 180 mg by mouth daily.    Marland Kitchen KLOR-CON M20 20 MEQ tablet TAKE 1 TABLET BY MOUTH EVERY DAY AS NEEDED 30 tablet 11  . Multiple Vitamin (MULTIVITAMIN) tablet Take 1 tablet by mouth daily. Hair skin and nails    .  Triamcinolone Acetonide (NASACORT ALLERGY 24HR NA) Place 1 spray into the nose daily.    . vitamin C (ASCORBIC ACID) 500 MG tablet Take 500 mg by mouth daily.     No current facility-administered medications for this visit.    Review of Systems General: no complaints  HEENT: no complaints  Lungs: no complaints  Cardiac: no complaints  GI: no complaints  GU: no complaints  Musculoskeletal: no complaints  Extremities: no complaints  Skin: no complaints  Neuro: no complaints  Endocrine: no complaints  Psych: no complaints       Objective:  Physical Examination:  BP 108/73 mmHg  Pulse 69  Temp(Src) 97.9 F (36.6 C) (Oral)  Resp 20  Wt 138 lb 0.1 oz  (62.6 kg)   ECOG Performance Status: 0 - Asymptomatic  General appearance: alert, cooperative and appears stated age HEENT:PERRLA, extra ocular movement intact and sclera clear, anicteric Lymph node survey: non-palpable, inguinal Extremities: extremities normal, atraumatic, no cyanosis or edema Neurological exam reveals alert, oriented, normal speech, no focal findings or movement disorder noted.  Pelvic: exam chaperoned by nurse;  Vulva: normal appearing vulva with no masses, tenderness or lesions; Vagina: normal vagina on initial speculum exam, but on BME nodularity was present posterior to the cervix approximately 2 small lesions one measuring approximately 9 mm and the other was smaller. Repeat speculum exam revealed a vaginal cyst approximately 9-10 mm on the posterior superior vaginal wall ; Adnexa: normal adnexa in size, nontender and no masses; Uterus: uterus is normal size, shape, consistency and nontender; Cervix: no lesions, stenotic os; Rectal: confirmatory and revealed lesions in the rectovaginal septum.   Vaginal and cervical Pap smears obtained. Cervical smear included HRHPV testing.     Lab Review Labs on site today: none  Radiologic Imaging: none    Assessment:  Maria Kemp is a 59 y.o. female diagnosed with history of abnormal Pap smears s/p LEEP and CKC with most recent normal Pap and HRHPV negative. Vaginal nodule of uncertain etiology, possible sebaceous/epidermal cyst or endometriosis of the rectovaginal septum.  Plan:   Problem List Items Addressed This Visit    None    Visit Diagnoses    Low grade squamous intraepithelial lesion on cytologic smear of cervix (lgsil)    -  Primary    Relevant Orders    Pap liquid-based and HPV (high risk)    Vaginal lesion        Relevant Orders    Pap LB (liquid-based)       We will follow up on vaginal and cervical Pap smears obtained. Cervical smear included HRHPV testing. If negative then repeat Pap/HPV in 3 years  but repeat pelvic exam annually.  Repeat vaginal exam in approximately 4 weeks to assess vaginal nodule. If stable and her history of multifocal sebaceous/epidermal cysts I don't think she needs a biopsy. If increased in size plan biopsy.   Artelia LarocheSECORD,ANGELES ALVAREZ, MD   ADDENDUM: 11/14/2015  Cervical PAP: LGSIL with HPV effect. HRHPV negative Vaginal Pap: ASCUS  I have recommended colposcopy with cervical biopsies and ECC based on findings. The patient is scheduled for an appointment in January to follow up the vaginal nodule and this assessment can be performed then.  Artelia LarocheSECORD,ANGELES ALVAREZ, MD    CC:  PCP Sherlene Shamseresa L Tullo, MD [address] [place], [place] [address] [phone]

## 2015-10-31 NOTE — Progress Notes (Signed)
Patient here today for ongoing follow up regarding abnormal pap smears in the past. Patient denies any problems or concerns today.

## 2015-10-31 NOTE — Patient Instructions (Addendum)
Epidermal Cyst An epidermal cyst is sometimes called a sebaceous cyst, epidermal inclusion cyst, or infundibular cyst. These cysts usually contain a substance that looks "pasty" or "cheesy" and may have a bad smell. This substance is a protein called keratin. Epidermal cysts are usually found on the face, neck, or trunk. They may also occur in the vaginal area or other parts of the genitalia of both men and women. Epidermal cysts are usually small, painless, slow-growing bumps or lumps that move freely under the skin. It is important not to try to pop them. This may cause an infection and lead to tenderness and swelling. CAUSES  Epidermal cysts may be caused by a deep penetrating injury to the skin or a plugged hair follicle, often associated with acne. SYMPTOMS  Epidermal cysts can become inflamed and cause:  Redness.  Tenderness.  Increased temperature of the skin over the bumps or lumps.  Grayish-white, bad smelling material that drains from the bump or lump. DIAGNOSIS  Epidermal cysts are easily diagnosed by your caregiver during an exam. Rarely, a tissue sample (biopsy) may be taken to rule out other conditions that may resemble epidermal cysts. TREATMENT   Epidermal cysts often get better and disappear on their own. They are rarely ever cancerous.  If a cyst becomes infected, it may become inflamed and tender. This may require opening and draining the cyst. Treatment with antibiotics may be necessary. When the infection is gone, the cyst may be removed with minor surgery.  Small, inflamed cysts can often be treated with antibiotics or by injecting steroid medicines.  Sometimes, epidermal cysts become large and bothersome. If this happens, surgical removal in your caregiver's office may be necessary. HOME CARE INSTRUCTIONS  Only take over-the-counter or prescription medicines as directed by your caregiver.  Take your antibiotics as directed. Finish them even if you start to feel  better. SEEK MEDICAL CARE IF:   Your cyst becomes tender, red, or swollen.  Your condition is not improving or is getting worse.  You have any other questions or concerns. MAKE SURE YOU:  Understand these instructions.  Will watch your condition.  Will get help right away if you are not doing well or get worse.   This information is not intended to replace advice given to you by your health care provider. Make sure you discuss any questions you have with your health care provider.   Document Released: 10/25/2004 Document Revised: 02/16/2012 Document Reviewed: 06/02/2011 Elsevier Interactive Patient Education 2016 Elsevier Inc.  

## 2015-11-07 ENCOUNTER — Telehealth: Payer: Self-pay

## 2015-11-07 NOTE — Telephone Encounter (Signed)
  Oncology Nurse Navigator Documentation    Navigator Encounter Type: Telephone (11/07/15 1000) Patient Visit Type: Follow-up (pap results) (11/07/15 1000)                    Time Spent with Patient: 15 (11/07/15 1000)   [facility] called to get results of 10/31/15 pap smears. They stated they went to pathology this am and we should have the results back today.

## 2015-11-08 LAB — PAP LB (LIQUID-BASED): PAP SMEAR COMMENT: 0

## 2015-11-09 LAB — PAP LB AND HPV HIGH-RISK
HPV, HIGH-RISK: NEGATIVE
PAP Smear Comment: 0

## 2015-11-14 ENCOUNTER — Telehealth: Payer: Self-pay

## 2015-11-14 NOTE — Telephone Encounter (Signed)
  Oncology Nurse Navigator Documentation    Navigator Encounter Type: Telephone (11/14/15 1600)                      Time Spent with Patient: 15 (11/14/15 1600)   Spoke with Dr Metta Clinesrisp on the phone. She does want to accept appt on 11/21/15 at 1515 with Dr Johnnette LitterBerchuck for her colposcopy.

## 2015-11-14 NOTE — Telephone Encounter (Signed)
  Oncology Nurse Navigator Documentation    Navigator Encounter Type: Telephone (11/14/15 1500) Patient Visit Type: Follow-up (pap) (11/14/15 1500)                    Time Spent with Patient: 30 (11/14/15 1500)   Called and notified of pap smear/HPV results.Instructed that she will nee a colposcopy with biopsy at next appt 12-19-15. She would like to have that appt sooner. Attempted x2 to call with appt. 1426 and 1517. Voicemail full and unable to leave my call back number. Will continue to attempt to reach.

## 2015-11-21 ENCOUNTER — Inpatient Hospital Stay: Payer: BLUE CROSS/BLUE SHIELD | Attending: Obstetrics and Gynecology | Admitting: Obstetrics and Gynecology

## 2015-11-21 ENCOUNTER — Other Ambulatory Visit: Payer: Self-pay | Admitting: *Deleted

## 2015-11-21 VITALS — BP 127/76 | HR 76 | Temp 98.0°F | Wt 133.0 lb

## 2015-11-21 DIAGNOSIS — Z8371 Family history of colonic polyps: Secondary | ICD-10-CM | POA: Insufficient documentation

## 2015-11-21 DIAGNOSIS — I10 Essential (primary) hypertension: Secondary | ICD-10-CM | POA: Insufficient documentation

## 2015-11-21 DIAGNOSIS — E785 Hyperlipidemia, unspecified: Secondary | ICD-10-CM | POA: Diagnosis not present

## 2015-11-21 DIAGNOSIS — Z79899 Other long term (current) drug therapy: Secondary | ICD-10-CM | POA: Diagnosis not present

## 2015-11-21 DIAGNOSIS — N870 Mild cervical dysplasia: Secondary | ICD-10-CM | POA: Diagnosis not present

## 2015-11-21 NOTE — Progress Notes (Signed)
Gynecologic Oncology Visit   Referring Provider: Dr. Arvil Chaco  Chief Concern: abnormal Pap smears  Subjective:  Maria Kemp is a 59 y.o. female who has a long history of slightly abnormal Pap smears, mainly LGSIL, often negative biopsies after colposcopy. 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   She was seen by Dr Sonia Side 11/16 and had LSIL cervical PAP, Vaginal Pap: ASCUS, negative HR HPV. She also saw a small cyst in the vagina just below the cervix.  Patient denies vaginal bleeding, discharge, pain.  Gynecologic History Dr Arvil Chaco performed LEEP in 1999 and CKC in 2006 for cervical dysplasia.  Subsequent Pap smears vary between normal and LGSIL with high risk HPV.  Since 2013 her q6 month PAPs have shown ASCUS, Normal, LSIL, LSIL.  No HPV testing or colposcopy was done during that time period.  10/2014 she saw Dr. Johnnette Litter. Colposcopy was performed. The transformation zone was not visible, but there were no lesions. Pap NILM and HRHPV negative.   Problem List: Patient Active Problem List   Diagnosis Date Noted  . H/O sebaceous cyst 09/06/2015  . Hyperlipidemia LDL goal <130 09/06/2015  . Pruritus 03/05/2015  . Lichen planus 09/04/2014  . Essential hypertension, benign 09/04/2014  . Mild cervical dysplasia 09/04/2014  . Family history of colonic polyps 09/04/2014  . H/O rotator cuff tear 09/04/2014    Past Medical History: Past Medical History  Diagnosis Date  . Hypertension   . Hyperlipidemia   . Colon polyps     Past Surgical History: Past Surgical History  Procedure Laterality Date  . Abdominoplasty  1995  . Cesarean section  1989    Past Gynecologic History:  Menarche: 11 Menstrual details: postmenopausal  OB History: G3P2  Family History: Family History  Problem Relation Age of Onset  . Arthritis Mother   . Hyperlipidemia Mother   . Hypertension Mother   . Heart disease Mother   . Arthritis Father   . Cancer Father   . Hyperlipidemia Father   .  Hypertension Father   . Heart disease Father   . Diabetes Father   . Hyperlipidemia Brother   . Hypertension Brother    Social History: Social History   Social History  . Marital Status: Divorced    Spouse Name: N/A  . Number of Children: N/A  . Years of Education: N/A   Occupational History  . Not on file.   Social History Main Topics  . Smoking status: Never Smoker   . Smokeless tobacco: Never Used  . Alcohol Use: Yes     Comment: rarely  . Drug Use: No  . Sexual Activity: Not on file   Other Topics Concern  . Not on file   Social History Narrative    Allergies: Allergies  Allergen Reactions  . Codeine Anaphylaxis  . Shrimp [Shellfish Allergy] Anaphylaxis    All shell fish    Current Medications: Current Outpatient Prescriptions  Medication Sig Dispense Refill  . CADUET 5-10 MG per tablet Take 1 tablet by mouth daily. 30 tablet 5  . calcium citrate (CALCITRATE - DOSED IN MG ELEMENTAL CALCIUM) 950 MG tablet Take 200 mg of elemental calcium by mouth daily.    . calcium-vitamin D (OSCAL WITH D) 500-200 MG-UNIT tablet Take 1 tablet by mouth daily with breakfast.    . Cholecalciferol (D3-1000) 1000 UNITS tablet Take 1,000 Units by mouth daily.    . fexofenadine (ALLEGRA) 180 MG tablet Take 180 mg by mouth daily.    Marland Kitchen  KLOR-CON M20 20 MEQ tablet TAKE 1 TABLET BY MOUTH EVERY DAY AS NEEDED 30 tablet 11  . Multiple Vitamin (MULTIVITAMIN) tablet Take 1 tablet by mouth daily. Hair skin and nails    . Triamcinolone Acetonide (NASACORT ALLERGY 24HR NA) Place 1 spray into the nose daily.    . vitamin C (ASCORBIC ACID) 500 MG tablet Take 500 mg by mouth daily.     No current facility-administered medications for this visit.    Review of Systems General: no complaints  HEENT: no complaints  Lungs: no complaints  Cardiac: no complaints  GI: no complaints  GU: no complaints  Musculoskeletal: no complaints  Extremities: no complaints  Skin: no complaints  Neuro: no  complaints  Endocrine: no complaints  Psych: no complaints     Objective:  Physical Examination:  BP 127/76 mmHg  Pulse 76  Temp(Src) 98 F (36.7 C)  Wt 133 lb 0.8 oz (60.35 kg)   ECOG Performance Status: 0 - Asymptomatic  General appearance: alert, cooperative and appears stated age HEENT:PERRLA, extra ocular movement intact and sclera clear, anicteric Lymph node survey: non-palpable, inguinal Extremities: extremities normal, atraumatic, no cyanosis or edema Neurological exam reveals alert, oriented, normal speech, no focal findings or movement disorder noted.  Pelvic: exam chaperoned by nurse;  Vulva: normal appearing vulva with no masses, tenderness or lesions; Vagina: normal vagina with small cyst just below cervix posteriorly. Adnexa: normal adnexa in size, nontender and no masses; Uterus: uterus is normal size, shape, consistency and nontender; Cervix: no lesions, stenotic os; Rectal: confirmatory and revealed lesions in the rectovaginal septum.   Procedure: Colposcopy was performed today with application of dilute acetic acid. The transformation zone was not visible, but there were no acetowhite lesions seen.  Cervical os is too stenotic for ECC in clinic.  The small cyst below the cervix was biopsied with a forceps. Silver nitrate was applied.    Assessment:  Maria Kemp is a 59 y.o. female with LSIL PAP smear with negative HR HPV s/p LEEP and CKC. Vaginal nodule, possible sebaceous/epidermal cyst or endometriosis of the rectovaginal septum.   Plan:   Problem List Items Addressed This Visit      Genitourinary   Mild cervical dysplasia - Primary   Relevant Orders   Biopsy vaginal     Cervical os too stenotic for ECC in clinic, but PAP only mildly abnormal (LSIL) and HR HPV negative now.    We will follow up on vaginal biopsy obtained today.  This is probably an inclusion cyst.  She has a history of multifocal sebaceous/epidermal cysts.   She will return in 6 months  for follow up.    Leida LauthBerchuck, Evetta Renner, MD  CC:  PCP Sherlene Shamseresa L Tullo, MD [address] [place], [place] [address] [phone]

## 2015-11-21 NOTE — Addendum Note (Signed)
Addended by: Leida LauthBERCHUCK, Daymian Lill on: 11/21/2015 04:03 PM   Modules accepted: Orders

## 2015-11-23 LAB — SURGICAL PATHOLOGY

## 2015-11-27 ENCOUNTER — Telehealth: Payer: Self-pay

## 2015-11-27 NOTE — Telephone Encounter (Signed)
  Oncology Nurse Navigator Documentation    Navigator Encounter Type: Telephone (11/27/15 1400)                      Time Spent with Patient: 15 (11/27/15 1400)   Results of biopsy called to patient. Has 6 month appt already scheduled. Dr Johnnette LitterBerchuck also sent copy of pathology. If any further follow up is needed we will notify patient otherwise she will keep her appt in June.

## 2015-12-07 ENCOUNTER — Other Ambulatory Visit: Payer: Self-pay | Admitting: *Deleted

## 2015-12-19 ENCOUNTER — Ambulatory Visit: Payer: BLUE CROSS/BLUE SHIELD

## 2015-12-25 ENCOUNTER — Telehealth: Payer: Self-pay | Admitting: *Deleted

## 2015-12-25 DIAGNOSIS — I10 Essential (primary) hypertension: Secondary | ICD-10-CM

## 2015-12-25 MED ORDER — CADUET 5-10 MG PO TABS: 1.0000 | ORAL_TABLET | Freq: Every day | ORAL | Status: DC

## 2015-12-25 NOTE — Telephone Encounter (Signed)
Patient has requested a medication refill for Caduet.  She stated ht she would like to [facility] on [address]

## 2016-01-02 LAB — HM MAMMOGRAPHY

## 2016-01-04 ENCOUNTER — Encounter: Payer: Self-pay | Admitting: Internal Medicine

## 2016-01-11 ENCOUNTER — Other Ambulatory Visit: Payer: Self-pay | Admitting: Internal Medicine

## 2016-01-11 NOTE — Telephone Encounter (Signed)
Caudet already filled 12/25/15

## 2016-01-23 ENCOUNTER — Other Ambulatory Visit: Payer: Self-pay | Admitting: Internal Medicine

## 2016-02-26 ENCOUNTER — Other Ambulatory Visit: Payer: Self-pay

## 2016-02-26 MED ORDER — OSELTAMIVIR PHOSPHATE 75 MG PO CAPS: 75.0000 mg | ORAL_CAPSULE | Freq: Every day | ORAL | Status: DC

## 2016-02-26 NOTE — Telephone Encounter (Signed)
Pt states that she has two family members that tested positive as of today for the flu. 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 Please advise, thanks

## 2016-02-26 NOTE — Telephone Encounter (Signed)
rx sent for prophylaxis

## 2016-03-05 ENCOUNTER — Ambulatory Visit (INDEPENDENT_AMBULATORY_CARE_PROVIDER_SITE_OTHER): Payer: BLUE CROSS/BLUE SHIELD | Admitting: Internal Medicine

## 2016-03-05 ENCOUNTER — Encounter: Payer: Self-pay | Admitting: Internal Medicine

## 2016-03-05 VITALS — BP 116/78 | HR 79 | Temp 98.3°F | Resp 12 | Ht 63.0 in | Wt 140.5 lb

## 2016-03-05 DIAGNOSIS — E876 Hypokalemia: Secondary | ICD-10-CM | POA: Diagnosis not present

## 2016-03-05 DIAGNOSIS — Z86010 Personal history of colonic polyps: Secondary | ICD-10-CM | POA: Diagnosis not present

## 2016-03-05 DIAGNOSIS — E785 Hyperlipidemia, unspecified: Secondary | ICD-10-CM | POA: Diagnosis not present

## 2016-03-05 DIAGNOSIS — D708 Other neutropenia: Secondary | ICD-10-CM | POA: Diagnosis not present

## 2016-03-05 MED ORDER — ZOSTER VACCINE LIVE 19400 UNT/0.65ML ~~LOC~~ SOLR: 0.6500 mL | Freq: Once | SUBCUTANEOUS | Status: DC

## 2016-03-05 NOTE — Progress Notes (Signed)
Subjective:  Patient ID: Maria Kemp, female    DOB: [birth year]  Age: 60 y.o. MRN: [id number]  CC: The primary encounter diagnosis was History of colonic polyps. Diagnoses of Hypokalemia, Hyperlipidemia, Other neutropenia ([place]), and Hyperlipidemia LDL goal <130 were also pertinent to this visit.  HPI Maria Kemp presents for follow up on hypertension, hyperlipidemia. Hypertension: patient checks blood pressure twice weekly at home.  Readings have been for the most part < 140/80 at rest . Patient is following a reduced salt diet most days and is taking medications as prescribed.  She is exercising 5 days per work and has a very health Mediterranean style diet.     Outpatient Prescriptions Prior to Visit  Medication Sig Dispense Refill  . CADUET 5-10 MG tablet TAKE ONE TABLET BY MOUTH EVERY DAY 30 tablet 2  . calcium citrate (CALCITRATE - DOSED IN MG ELEMENTAL CALCIUM) 950 MG tablet Take 200 mg of elemental calcium by mouth daily.    . calcium-vitamin D (OSCAL WITH D) 500-200 MG-UNIT tablet Take 1 tablet by mouth daily with breakfast.    . Cholecalciferol (D3-1000) 1000 UNITS tablet Take 1,000 Units by mouth daily.    . fexofenadine (ALLEGRA) 180 MG tablet Take 180 mg by mouth daily.    . Multiple Vitamin (MULTIVITAMIN) tablet Take 1 tablet by mouth daily. Hair skin and nails    . Triamcinolone Acetonide (NASACORT ALLERGY 24HR NA) Place 1 spray into the nose daily.    Marland Kitchen KLOR-CON M20 20 MEQ tablet TAKE 1 TABLET BY MOUTH EVERY DAY AS NEEDED (Patient not taking: Reported on 03/05/2016) 30 tablet 11  . oseltamivir (TAMIFLU) 75 MG capsule Take 1 capsule (75 mg total) by mouth daily. (Patient not taking: Reported on 03/05/2016) 10 capsule 0  . vitamin C (ASCORBIC ACID) 500 MG tablet Take 500 mg by mouth daily. Reported on 03/05/2016     No facility-administered medications prior to visit.    Review of Systems;  Patient denies headache, fevers, malaise, unintentional weight loss, skin rash,  eye pain, sinus congestion and sinus pain, sore throat, dysphagia,  hemoptysis , cough, dyspnea, wheezing, chest pain, palpitations, orthopnea, edema, abdominal pain, nausea, melena, diarrhea, constipation, flank pain, dysuria, hematuria, urinary  Frequency, nocturia, numbness, tingling, seizures,  Focal weakness, Loss of consciousness,  Tremor, insomnia, depression, anxiety, and suicidal ideation.      Objective:  BP 116/78 mmHg  Pulse 79  Temp(Src) 98.3 F (36.8 C) (Oral)  Resp 12  Ht '5\' 3"'  (1.6 m)  Wt 140 lb 8 oz (63.73 kg)  BMI 24.89 kg/m2  SpO2 97%  BP Readings from Last 3 Encounters:  03/05/16 116/78  11/21/15 127/76  10/31/15 108/73    Wt Readings from Last 3 Encounters:  03/05/16 140 lb 8 oz (63.73 kg)  11/21/15 133 lb 0.8 oz (60.35 kg)  10/31/15 138 lb 0.1 oz (62.6 kg)    General appearance: alert, cooperative and appears stated age Ears: normal TM's and external ear canals both ears Throat: lips, mucosa, and tongue normal; teeth and gums normal Neck: no adenopathy, no carotid bruit, supple, symmetrical, trachea midline and thyroid not enlarged, symmetric, no tenderness/mass/nodules Back: symmetric, no curvature. ROM normal. No CVA tenderness. Lungs: clear to auscultation bilaterally Heart: regular rate and rhythm, S1, S2 normal, no murmur, click, rub or gallop Abdomen: soft, non-tender; bowel sounds normal; no masses,  no organomegaly Pulses: 2+ and symmetric Skin: Skin color, texture, turgor normal. No rashes or lesions Lymph nodes: Cervical, supraclavicular, and  axillary nodes normal.  No results found for: HGBA1C  Lab Results  Component Value Date   CREATININE 0.83 03/05/2016   CREATININE 0.80 09/06/2015   CREATININE 0.85 03/05/2015    Lab Results  Component Value Date   WBC 3.9* 03/05/2016   HGB 12.7 03/05/2016   HCT 38.2 03/05/2016   PLT 320.0 03/05/2016   GLUCOSE 73 03/05/2016   CHOL 179 03/05/2015   TRIG 75.0 03/05/2015   HDL 62.00 03/05/2015     LDLDIRECT 90.0 03/05/2016   LDLCALC 102* 03/05/2015   ALT 27 03/05/2016   AST 25 03/05/2016   NA 139 03/05/2016   K 3.8 03/05/2016   CL 102 03/05/2016   CREATININE 0.83 03/05/2016   BUN 15 03/05/2016   CO2 30 03/05/2016   TSH 0.69 09/07/2014    No results found.  Assessment & Plan:   Problem List Items Addressed This Visit    Hyperlipidemia LDL goal <130    Managed with 10 mg atorvastatin  (Caduet) . She has no side effects and will return for fasting labs and liver enzymes . No changes today. Nonfasting LDl is 90  Lab Results  Component Value Date   CHOL 179 03/05/2015   HDL 62.00 03/05/2015   LDLCALC 102* 03/05/2015   LDLDIRECT 90.0 03/05/2016   TRIG 75.0 03/05/2015   CHOLHDL 3 03/05/2015   Lab Results  Component Value Date   ALT 27 03/05/2016   AST 25 03/05/2016   ALKPHOS 61 03/05/2016   BILITOT 0.3 03/05/2016             Other Visit Diagnoses    History of colonic polyps    -  Primary    Relevant Orders    Ambulatory referral to Gastroenterology    Hypokalemia        Relevant Orders    Comp Met (CMET) (Completed)    Magnesium (Completed)    Hyperlipidemia        Relevant Orders    Direct LDL (Completed)    Other neutropenia ([place])        Relevant Orders    CBC with Differential/Platelet (Completed)       I am having [name] maintain her Triamcinolone Acetonide (NASACORT ALLERGY 24HR NA), fexofenadine, calcium citrate, calcium-vitamin D, vitamin C, Cholecalciferol, multivitamin, KLOR-CON M20, CADUET, oseltamivir, and zoster vaccine live (PF).  Meds ordered this encounter  Medications  . DISCONTD: zoster vaccine live, PF, (ZOSTAVAX) 18563 UNT/0.65ML injection    Sig: Inject 19,400 Units into the skin once.    Dispense:  1 each    Refill:  0  . zoster vaccine live, PF, (ZOSTAVAX) 14970 UNT/0.65ML injection    Sig: Inject 19,400 Units into the skin once.    Dispense:  1 each    Refill:  0    Medications Discontinued During This Encounter   Medication Reason  . zoster vaccine live, PF, (ZOSTAVAX) [id number] UNT/0.65ML injection Reorder    Follow-up: No Follow-up on file.   Crecencio Mc, MD

## 2016-03-05 NOTE — Progress Notes (Signed)
Pre-visit discussion using our clinic review tool. No additional management support is needed unless otherwise documented below in the visit note.  

## 2016-03-05 NOTE — Patient Instructions (Addendum)
Try the [employer] Flakes cereal available  From [employer] ([employer], bottom aisle)   Kevita  Sparkling probiotic  Is excellent tasting!! Available at [facility] and [employer]   Premier Protein shakes  30 g protein 160 cal  1 g sugars taste GREAT!! [employer] SELLS 12 PACKS OF THE VANILLA AND CHOCOLATE   To make a low carb chip :  Take the Joseph's Lavash or Pita bread,  Or the Mission Low carb whole wheat tortilla   Place on metal cookie sheet  Brush with olive oil  Sprinkle garlic powder (NOT garlic salt), grated parmesan cheese, mediterranean seasoning , or all of them?  Bake at 225 or 250 for 90 minutes   We have substitutions for your potatoes!!  Try the mashed cauliflower and riced cauliflower dishes instead of rice and mashed potatoes  Mashed turnips are also very low carb!   For dessert:  Try the Dannon Lt n Fit greek yogurt dessert flavors and top with reddi Whip .  8 carbs,  80 calories     Try Oikos Triple Zero AustriaGreek Yogurt in the salted caramel, and the coffee flavors  With Whipped Cream for dessert

## 2016-03-06 LAB — CBC WITH DIFFERENTIAL/PLATELET
Basophils Absolute: 0 10*3/uL (ref 0.0–0.1)
Basophils Relative: 1 % (ref 0.0–3.0)
EOS PCT: 0.8 % (ref 0.0–5.0)
Eosinophils Absolute: 0 10*3/uL (ref 0.0–0.7)
HCT: 38.2 % (ref 36.0–46.0)
Hemoglobin: 12.7 g/dL (ref 12.0–15.0)
LYMPHS ABS: 1.5 10*3/uL (ref 0.7–4.0)
LYMPHS PCT: 37.7 % (ref 12.0–46.0)
MCHC: 33.2 g/dL (ref 30.0–36.0)
MCV: 89.4 fl (ref 78.0–100.0)
MONO ABS: 0.4 10*3/uL (ref 0.1–1.0)
MONOS PCT: 11.4 % (ref 3.0–12.0)
NEUTROS ABS: 1.9 10*3/uL (ref 1.4–7.7)
NEUTROS PCT: 49.1 % (ref 43.0–77.0)
PLATELETS: 320 10*3/uL (ref 150.0–400.0)
RBC: 4.28 Mil/uL (ref 3.87–5.11)
RDW: 14.5 % (ref 11.5–15.5)
WBC: 3.9 10*3/uL — ABNORMAL LOW (ref 4.0–10.5)

## 2016-03-06 LAB — COMPREHENSIVE METABOLIC PANEL
ALK PHOS: 61 U/L (ref 39–117)
ALT: 27 U/L (ref 0–35)
AST: 25 U/L (ref 0–37)
Albumin: 4.4 g/dL (ref 3.5–5.2)
BUN: 15 mg/dL (ref 6–23)
CHLORIDE: 102 meq/L (ref 96–112)
CO2: 30 mEq/L (ref 19–32)
Calcium: 9.7 mg/dL (ref 8.4–10.5)
Creatinine, Ser: 0.83 mg/dL (ref 0.40–1.20)
GFR: 90.01 mL/min (ref >60.00)
GLUCOSE: 73 mg/dL (ref 70–99)
POTASSIUM: 3.8 meq/L (ref 3.5–5.1)
SODIUM: 139 meq/L (ref 135–145)
TOTAL PROTEIN: 7.3 g/dL (ref 6.0–8.3)
Total Bilirubin: 0.3 mg/dL (ref 0.2–1.2)

## 2016-03-06 LAB — LDL CHOLESTEROL, DIRECT: Direct LDL: 90 mg/dL

## 2016-03-06 LAB — MAGNESIUM: Magnesium: 2.4 mg/dL (ref 1.5–2.5)

## 2016-03-07 ENCOUNTER — Encounter: Payer: Self-pay | Admitting: *Deleted

## 2016-03-08 NOTE — Assessment & Plan Note (Signed)
Managed with 10 mg atorvastatin  (Caduet) . She has no side effects and will return for fasting labs and liver enzymes . No changes today. Nonfasting LDl is 90  Lab Results  Component Value Date   CHOL 179 03/05/2015   HDL 62.00 03/05/2015   LDLCALC 102* 03/05/2015   LDLDIRECT 90.0 03/05/2016   TRIG 75.0 03/05/2015   CHOLHDL 3 03/05/2015   Lab Results  Component Value Date   ALT 27 03/05/2016   AST 25 03/05/2016   ALKPHOS 61 03/05/2016   BILITOT 0.3 03/05/2016

## 2016-03-17 ENCOUNTER — Telehealth: Payer: Self-pay | Admitting: Internal Medicine

## 2016-03-17 DIAGNOSIS — Z86010 Personal history of colonic polyps: Secondary | ICD-10-CM

## 2016-04-29 ENCOUNTER — Other Ambulatory Visit: Payer: Self-pay | Admitting: Internal Medicine

## 2016-05-21 ENCOUNTER — Encounter: Payer: Self-pay | Admitting: Obstetrics and Gynecology

## 2016-05-21 ENCOUNTER — Inpatient Hospital Stay: Payer: BLUE CROSS/BLUE SHIELD | Attending: Obstetrics and Gynecology | Admitting: Obstetrics and Gynecology

## 2016-05-21 VITALS — BP 106/73 | HR 85 | Temp 98.3°F | Wt 140.4 lb

## 2016-05-21 DIAGNOSIS — N893 Dysplasia of vagina, unspecified: Secondary | ICD-10-CM

## 2016-05-21 DIAGNOSIS — R87620 Atypical squamous cells of undetermined significance on cytologic smear of vagina (ASC-US): Secondary | ICD-10-CM

## 2016-05-21 DIAGNOSIS — R87612 Low grade squamous intraepithelial lesion on cytologic smear of cervix (LGSIL): Secondary | ICD-10-CM | POA: Insufficient documentation

## 2016-05-21 NOTE — Progress Notes (Signed)
Gynecologic Oncology Visit   Referring Provider: Dr. Arvil ChacoVan Dalen  Chief Concern: abnormal Pap smears and VAIN1  Subjective:  Maria Kemp is a 60 y.o. female who has a long history of slightly abnormal Pap smears, mainly LGSIL, often negative biopsies after colposcopy. 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   She was seen by Dr Johnnette LitterBerchuck 12/16 for colposcopy given LSIL cervical PAP, Vaginal Pap: ASCUS, negative HR HPV on 11/16. Biopsy of small cyst in the vagina just below the cervix was performed.  DIAGNOSIS:  A. VAGINA; BIOPSY:  - INFLAMED VAGINAL MUCOSA WITH A LOW GRADE SQUAMOUS INTRAEPITHELIAL  LESION (VAIN 1) AND PARTIAL BENIGN CYST.   She has no complaints specifically no vaginal bleeding, discharge, pain.  Gynecologic History Dr Arvil ChacoVan Dalen performed LEEP in 1999 and CKC in 2006 for cervical dysplasia.  Subsequent Pap smears vary between normal and LGSIL with high risk HPV.  Since 2013 her q6 month PAPs have shown ASCUS, Normal, LSIL, LSIL.  No HPV testing or colposcopy was done during that time period.  10/2014 she saw Dr. Johnnette LitterBerchuck. Colposcopy was performed. The transformation zone was not visible, but there were no lesions. Pap NILM and HRHPV negative.   Problem List: Patient Active Problem List   Diagnosis Date Noted  . Vaginal dysplasia 05/21/2016  . Low grade squamous intraepithelial lesion on cytologic smear of cervix (lgsil) 05/21/2016  . History of colonic polyps 03/17/2016  . H/O sebaceous cyst 09/06/2015  . Hyperlipidemia LDL goal <130 09/06/2015  . Pruritus 03/05/2015  . Lichen planus 09/04/2014  . Essential hypertension, benign 09/04/2014  . Mild cervical dysplasia 09/04/2014  . Family history of colonic polyps 09/04/2014  . H/O rotator cuff tear 09/04/2014    Past Medical History: Past Medical History  Diagnosis Date  . Hypertension   . Hyperlipidemia   . Colon polyps     Past Surgical History: Past Surgical History  Procedure Laterality Date  .  Abdominoplasty  1995  . Cesarean section  1989    Past Gynecologic History:  Menarche: 11 Menstrual details: postmenopausal  OB History: G3P2  Family History: Family History  Problem Relation Age of Onset  . Arthritis Mother   . Hyperlipidemia Mother   . Hypertension Mother   . Heart disease Mother   . Arthritis Father   . Cancer Father   . Hyperlipidemia Father   . Hypertension Father   . Heart disease Father   . Diabetes Father   . Hyperlipidemia Brother   . Hypertension Brother    Social History: Social History   Social History  . Marital Status: Divorced    Spouse Name: N/A  . Number of Children: N/A  . Years of Education: N/A   Occupational History  . Not on file.   Social History Main Topics  . Smoking status: Never Smoker   . Smokeless tobacco: Never Used  . Alcohol Use: Yes     Comment: rarely  . Drug Use: No  . Sexual Activity: Not on file   Other Topics Concern  . Not on file   Social History Narrative    Allergies: Allergies  Allergen Reactions  . Codeine Anaphylaxis  . Shrimp [Shellfish Allergy] Anaphylaxis    All shell fish    Current Medications: Current Outpatient Prescriptions  Medication Sig Dispense Refill  . amLODipine-atorvastatin (CADUET) 5-10 MG tablet Take by mouth.    Marland Kitchen. CADUET 5-10 MG tablet TAKE ONE TABLET EVERY DAY 30 tablet 6  . Calcium Carbonate-Vitamin  D (CALCIUM-VITAMIN D) 500-200 MG-UNIT tablet Take by mouth.    . Calcium Citrate 200 MG TABS Take by mouth.    . Cholecalciferol (D3-1000) 1000 UNITS tablet Take 1,000 Units by mouth daily.    . fexofenadine (ALLEGRA) 180 MG tablet Take 180 mg by mouth daily.    . Multiple Vitamin (MULTIVITAMIN) tablet Take by mouth.    . Triamcinolone Acetonide (NASACORT ALLERGY 24HR NA) Place 1 spray into the nose daily.     No current facility-administered medications for this visit.    Review of Systems General: no complaints  HEENT: no complaints  Lungs: no complaints   Cardiac: no complaints  GI: no complaints  GU: no complaints  Musculoskeletal: no complaints  Extremities: no complaints  Skin: no complaints  Neuro: no complaints  Endocrine: no complaints  Psych: no complaints     Objective:  Physical Examination:  BP 106/73 mmHg  Pulse 85  Temp(Src) 98.3 F (36.8 C) (Oral)  Wt 140 lb 6.9 oz (63.7 kg)   ECOG Performance Status: 0 - Asymptomatic  General appearance: alert, cooperative and appears stated age HEENT:PERRLA, extra ocular movement intact and sclera clear, anicteric Lymph node survey: non-palpable, inguinal Extremities: extremities normal, atraumatic, no cyanosis or edema Neurological exam reveals alert, oriented, normal speech, no focal findings or movement disorder noted.  Pelvic: exam chaperoned by nurse;  Vulva: normal appearing vulva with no masses, tenderness or lesions; Vagina: normal vagina and the previously seen small cyst just below cervix posteriorly is no longer visible. The rest of the exam was deferred from 11/2015 Adnexa: normal adnexa in size, nontender and no masses; Uterus: uterus is normal size, shape, consistency and nontender; Cervix: no lesions, stenotic os; Rectal: confirmatory and revealed lesions in the rectovaginal septum.     Assessment:  Maria Kemp is a 60 y.o. 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 Cervical os stenosis. Benign vaginal cyst.   Plan:   Problem List Items Addressed This Visit      Genitourinary   Vaginal dysplasia - Primary     Other   Low grade squamous intraepithelial lesion on cytologic smear of cervix (lgsil)    Other Visit Diagnoses    Atypical squamous cell changes of undetermined significance (ASCUS) on vaginal cytology          Reassurance was provided. She now has documented VAIN1. We discussed conservative vs surgical management. I reviewed ASCCP guidelines for cervical dysplasia that we can use for  vaginal dysplasia. Surgical treatment if considered if low grade dysplasia persistent for 2 years. I recommended continued surveillance with close follow up.  She will return in 6 months for follow up and repeat PAP/HPV.    Artelia Laroche, MD  CC:  PCP Sherlene Shams, MD [address] [place], [place] [address] [phone]

## 2016-05-21 NOTE — Patient Instructions (Signed)
Vaginal biopsy  Surgical Pathology  CASE: [id number]  PATIENT: Maria Kemp  Surgical Pathology Report      SPECIMEN SUBMITTED:  A. Vagina, biopsy   CLINICAL HISTORY:  None provided   PRE-OPERATIVE DIAGNOSIS:  Possible inclusion cyst   POST-OPERATIVE DIAGNOSIS:  None provided      DIAGNOSIS:  A. VAGINA; BIOPSY:  - INFLAMED VAGINAL MUCOSA WITH A LOW GRADE SQUAMOUS INTRAEPITHELIAL  LESION (VAIN 1) AND PARTIAL BENIGN CYST.            DIAGNOSIS:  Comment (A) Comment (A)CM    Comments: EPITHELIAL CELL ABNORMALITY.  LOW-GRADE SQUAMOUS INTRAEPITHELIAL LESION (LGSIL); HUMAN PAPILLOMAVIRUS  EFFECT IS PRESENT.      Specimen adequacy:  Comment CommentCM   Comments: Satisfactory for evaluation. Endocervical and/or squamous metaplastic  cells (endocervical component) are present.      CLINICIAN PROVIDED ICD10:  Comment CommentCM   Comments: R87.612    Performed by:  Comment CommentCM   Comments: Neil CrouchAngela Robertson, Cytotechnologist (ASCP)    Electronically signed by:  Comment CommentCM   Comments: Kermit Baloebecca J Varley, MD, Pathologist    PAP SMEAR COMMENT  . Marland Kitchen.    PATHOLOGIST PROVIDED ICD10:  Comment CommentCM   Comments: R87.612    Note:  Comment CommentCM   Comments: The Pap smear is a screening test designed to aid in the detection of  premalignant and malignant conditions of the uterine cervix. It is not a  diagnostic procedure and should not be used as the sole means of detecting  cervical cancer. Both false-positive and false-negative reports do occur.      HPV, high-risk Negative  Negative    Comments: This high-risk HPV test detects thirteen high-risk types  (16/18/31/33/35/39/45/51/52/56/58/59/68) without differentiation

## 2016-08-01 ENCOUNTER — Telehealth: Payer: Self-pay | Admitting: Internal Medicine

## 2016-08-01 NOTE — Telephone Encounter (Signed)
Ms. Maria Kemp called saying she thought Dr. Darrick Huntsmanullo wanted to see her in September or shortly after. I didn't see an upcoming appointment for her. She scheduled herself for October 11th, 2017 at 4:00pm but not having any issues. In addition, she's questioning if she'll have enough med refills to last her until next year. Please give her a call to let her know if she really needs the October appointment.   Pt's ph# [phone] Thank you.

## 2016-08-01 NOTE — Telephone Encounter (Signed)
Patient will be keeping appointment so she can receive medication refills.

## 2016-09-16 ENCOUNTER — Telehealth: Payer: Self-pay | Admitting: Internal Medicine

## 2016-09-16 NOTE — Telephone Encounter (Signed)
Pt called and had to reschedule her appt that was for 10/11 because of a schedule conflict. Pt has rescheduled for 11/22. Pt needs a refill on her CADUET 5-10 MG tablet.  Pharm[place] - [facility] [place] [id number]  Call pt @ [phone]

## 2016-09-17 ENCOUNTER — Ambulatory Visit: Payer: BLUE CROSS/BLUE SHIELD | Admitting: Internal Medicine

## 2016-09-17 MED ORDER — CADUET 5-10 MG PO TABS: 1.0000 | ORAL_TABLET | Freq: Every day | ORAL | 6 refills | Status: DC

## 2016-09-17 NOTE — Telephone Encounter (Signed)
Script sent  

## 2016-10-29 ENCOUNTER — Ambulatory Visit (INDEPENDENT_AMBULATORY_CARE_PROVIDER_SITE_OTHER): Payer: BLUE CROSS/BLUE SHIELD | Admitting: Internal Medicine

## 2016-10-29 ENCOUNTER — Encounter: Payer: Self-pay | Admitting: Internal Medicine

## 2016-10-29 VITALS — BP 132/86 | HR 68 | Temp 97.8°F | Resp 12 | Ht 63.0 in | Wt 134.5 lb

## 2016-10-29 DIAGNOSIS — Z8371 Family history of colonic polyps: Secondary | ICD-10-CM

## 2016-10-29 DIAGNOSIS — R944 Abnormal results of kidney function studies: Secondary | ICD-10-CM | POA: Diagnosis not present

## 2016-10-29 DIAGNOSIS — I10 Essential (primary) hypertension: Secondary | ICD-10-CM

## 2016-10-29 MED ORDER — LEVOFLOXACIN 500 MG PO TABS: 500.0000 mg | ORAL_TABLET | Freq: Every day | ORAL | 0 refills | Status: DC

## 2016-10-29 NOTE — Progress Notes (Signed)
Subjective:  Patient ID: Maria Kemp, female    DOB: 1956/01/27  Age: 60 y.o. MRN: [id number]  CC: The primary encounter diagnosis was Essential hypertension, benign. Diagnoses of Decreased GFR and Family history of colonic polyps were also pertinent to this visit.  Maria Kemp presents for follow up on hypertension and hyperlipidemia.. Hypertension: patient checks blood pressure twice weekly at home.  Readings have been for the most part <130/80 at rest . Patient is following a reduced salt diet most days and is taking medications as prescribed.  She is exercising regularly and has lost weight   LGSIL last PAP 6 months ago.   Mammogram Jan 2018 due  Due for 5 yr colonoscopy in  2018  Maria Kemp . Needs referral      Lab Results  Component Value Date   CREATININE 1.03 (H) 10/29/2016   Lab Results  Component Value Date   NA 141 10/29/2016   K 4.4 10/29/2016   CL 103 10/29/2016   CO2 31 10/29/2016   Lab Results  Component Value Date   CHOL 179 03/05/2015   HDL 62.00 03/05/2015   LDLCALC 102 (H) 03/05/2015   LDLDIRECT 90.0 03/05/2016   TRIG 75.0 03/05/2015   CHOLHDL 3 03/05/2015   ' Lab Results  Component Value Date   ALT 21 10/29/2016   AST 24 10/29/2016   ALKPHOS 64 10/29/2016   BILITOT 0.3 10/29/2016     Outpatient Medications Prior to Visit  Medication Sig Dispense Refill  . amLODipine-atorvastatin (CADUET) 5-10 MG tablet Take by mouth.    Marland Kitchen. CADUET 5-10 MG tablet Take 1 tablet by mouth daily. 30 tablet 6  . Calcium Carbonate-Vitamin D (CALCIUM-VITAMIN D) 500-200 MG-UNIT tablet Take by mouth.    . Calcium Citrate 200 MG TABS Take by mouth.    . Cholecalciferol (D3-1000) 1000 UNITS tablet Take 1,000 Units by mouth daily.    . fexofenadine (ALLEGRA) 180 MG tablet Take 180 mg by mouth daily.    . Multiple Vitamin (MULTIVITAMIN) tablet Take by mouth.    . Triamcinolone Acetonide (NASACORT ALLERGY 24HR NA) Place 1 spray into the nose daily.     No  facility-administered medications prior to visit.     Review of Systems;  Patient denies headache, fevers, malaise, unintentional weight loss, skin rash, eye pain, sinus congestion and sinus pain, sore throat, dysphagia,  hemoptysis , cough, dyspnea, wheezing, chest pain, palpitations, orthopnea, edema, abdominal pain, nausea, melena, diarrhea, constipation, flank pain, dysuria, hematuria, urinary  Frequency, nocturia, numbness, tingling, seizures,  Focal weakness, Loss of consciousness,  Tremor, insomnia, depression, anxiety, and suicidal ideation.      Objective:  BP 132/86   Pulse 68   Temp 97.8 F (36.6 C) (Oral)   Resp 12   Ht 5\' 3"  (1.6 m)   Wt 134 lb 8 oz (61 kg)   SpO2 100%   BMI 23.83 kg/m   BP Readings from Last 3 Encounters:  10/29/16 132/86  05/21/16 106/73  03/05/16 116/78    Wt Readings from Last 3 Encounters:  10/29/16 134 lb 8 oz (61 kg)  05/21/16 140 lb 6.9 oz (63.7 kg)  03/05/16 140 lb 8 oz (63.7 kg)    General appearance: alert, cooperative and appears stated age Ears: normal TM's and external ear canals both ears Throat: lips, mucosa, and tongue normal; teeth and gums normal Neck: no adenopathy, no carotid bruit, supple, symmetrical, trachea midline and thyroid not enlarged, symmetric, no tenderness/mass/nodules Back: symmetric, no  curvature. ROM normal. No CVA tenderness. Lungs: clear to auscultation bilaterally Heart: regular rate and rhythm, S1, S2 normal, no murmur, click, rub or gallop Abdomen: soft, non-tender; bowel sounds normal; no masses,  no organomegaly Pulses: 2+ and symmetric Skin: Skin color, texture, turgor normal. No rashes or lesions Lymph nodes: Cervical, supraclavicular, and axillary nodes normal.  No results found for: HGBA1C  Lab Results  Component Value Date   CREATININE 1.03 (H) 10/29/2016   CREATININE 0.83 03/05/2016   CREATININE 0.80 09/06/2015    Lab Results  Component Value Date   WBC 3.9 (L) 03/05/2016   HGB  12.7 03/05/2016   HCT 38.2 03/05/2016   PLT 320.0 03/05/2016   GLUCOSE 89 10/29/2016   CHOL 179 03/05/2015   TRIG 75.0 03/05/2015   HDL 62.00 03/05/2015   LDLDIRECT 90.0 03/05/2016   LDLCALC 102 (H) 03/05/2015   ALT 21 10/29/2016   AST 24 10/29/2016   NA 141 10/29/2016   K 4.4 10/29/2016   CL 103 10/29/2016   CREATININE 1.03 (H) 10/29/2016   BUN 16 10/29/2016   CO2 31 10/29/2016   TSH 0.69 09/07/2014    No results found.  Assessment & Plan:   Problem List Items Addressed This Visit    Essential hypertension, benign - Primary    Well controlled on current regimen. Howevere there has been a subtle change in her GFR,  Will need to repeat with urine check for protein in one week.       Relevant Orders   Comprehensive metabolic panel (Completed)   Family history of colonic polyps    She is due for 5 yr follow up colonoscopy with Dr Mechele CollinElliott      Relevant Orders   Ambulatory referral to Gastroenterology    Other Visit Diagnoses    Decreased GFR       Relevant Orders   Basic metabolic panel   Microalbumin / creatinine urine ratio    A total of 25 minutes of face to face time was spent with patient more than half of which was spent in counselling about the above mentioned conditions  and coordination of care   I am having Maria Kemp start on levofloxacin. I am also having her maintain her Triamcinolone Acetonide (NASACORT ALLERGY 24HR NA), fexofenadine, Cholecalciferol, amLODipine-atorvastatin, Calcium Citrate, calcium-vitamin D, multivitamin, and CADUET.  Meds ordered this encounter  Medications  . levofloxacin (LEVAQUIN) 500 MG tablet    Sig: Take 1 tablet (500 mg total) by mouth daily.    Dispense:  7 tablet    Refill:  0    There are no discontinued medications.  Follow-up: Return in about 6 months (around 04/28/2017).   Sherlene ShamsULLO, Tyheim Vanalstyne L, MD

## 2016-10-29 NOTE — Progress Notes (Signed)
Pre-visit discussion using our clinic review tool. No additional management support is needed unless otherwise documented below in the visit note.  

## 2016-10-29 NOTE — Patient Instructions (Signed)
New guidelines recommend 120/70 as your BP goal.  Let me know if you're still high when you recheck it  Levaquin sent to [facility] to take on your trip to [place]

## 2016-10-30 LAB — COMPREHENSIVE METABOLIC PANEL
ALBUMIN: 4.3 g/dL (ref 3.6–5.1)
ALK PHOS: 64 U/L (ref 33–130)
ALT: 21 U/L (ref 6–29)
AST: 24 U/L (ref 10–35)
BILIRUBIN TOTAL: 0.3 mg/dL (ref 0.2–1.2)
BUN: 16 mg/dL (ref 7–25)
CALCIUM: 9.5 mg/dL (ref 8.6–10.4)
CO2: 31 mmol/L (ref 20–31)
Chloride: 103 mmol/L (ref 98–110)
Creat: 1.03 mg/dL — ABNORMAL HIGH (ref 0.50–0.99)
Glucose, Bld: 89 mg/dL (ref 65–99)
Potassium: 4.4 mmol/L (ref 3.5–5.3)
Sodium: 141 mmol/L (ref 135–146)
TOTAL PROTEIN: 7.2 g/dL (ref 6.1–8.1)

## 2016-11-01 ENCOUNTER — Encounter: Payer: Self-pay | Admitting: Internal Medicine

## 2016-11-01 NOTE — Assessment & Plan Note (Signed)
Well controlled on current regimen. Howevere there has been a subtle change in her GFR,  Will need to repeat with urine check for protein in one week.

## 2016-11-01 NOTE — Assessment & Plan Note (Signed)
She is due for 5 yr follow up colonoscopy with Dr Mechele CollinElliott

## 2016-11-03 ENCOUNTER — Telehealth: Payer: Self-pay | Admitting: *Deleted

## 2016-11-03 NOTE — Telephone Encounter (Signed)
Pt requested a call for lab results  Pt contact [phone]

## 2016-11-04 NOTE — Telephone Encounter (Signed)
See result note.  

## 2016-11-11 ENCOUNTER — Other Ambulatory Visit (INDEPENDENT_AMBULATORY_CARE_PROVIDER_SITE_OTHER): Payer: BLUE CROSS/BLUE SHIELD

## 2016-11-11 DIAGNOSIS — R944 Abnormal results of kidney function studies: Secondary | ICD-10-CM

## 2016-11-11 LAB — BASIC METABOLIC PANEL
BUN: 10 mg/dL (ref 6–23)
CALCIUM: 9.5 mg/dL (ref 8.4–10.5)
CHLORIDE: 100 meq/L (ref 96–112)
CO2: 33 mEq/L — ABNORMAL HIGH (ref 19–32)
CREATININE: 0.72 mg/dL (ref 0.40–1.20)
GFR: 105.81 mL/min (ref >60.00)
Glucose, Bld: 94 mg/dL (ref 70–99)
Potassium: 3.3 mEq/L — ABNORMAL LOW (ref 3.5–5.1)
Sodium: 140 mEq/L (ref 135–145)

## 2016-11-11 LAB — MICROALBUMIN / CREATININE URINE RATIO
Creatinine,U: 66.4 mg/dL
MICROALB/CREAT RATIO: 1.1 mg/g (ref 0.0–30.0)
Microalb, Ur: <0.7 mg/dL (ref 0.0–1.9)

## 2016-11-12 ENCOUNTER — Telehealth: Payer: Self-pay | Admitting: Internal Medicine

## 2016-11-12 ENCOUNTER — Other Ambulatory Visit: Payer: BLUE CROSS/BLUE SHIELD

## 2016-11-12 NOTE — Telephone Encounter (Signed)
[facility] will not schedule her for colonoscopy until Nov 03 2017 per letter received!

## 2016-11-15 ENCOUNTER — Other Ambulatory Visit: Payer: Self-pay | Admitting: Internal Medicine

## 2016-11-15 ENCOUNTER — Encounter: Payer: Self-pay | Admitting: Internal Medicine

## 2016-11-15 DIAGNOSIS — E876 Hypokalemia: Secondary | ICD-10-CM

## 2016-11-15 MED ORDER — POTASSIUM CHLORIDE CRYS ER 20 MEQ PO TBCR: 20.0000 meq | EXTENDED_RELEASE_TABLET | Freq: Every day | ORAL | 0 refills | Status: DC

## 2016-11-19 ENCOUNTER — Inpatient Hospital Stay: Payer: BLUE CROSS/BLUE SHIELD | Attending: Obstetrics and Gynecology

## 2016-11-19 DIAGNOSIS — E785 Hyperlipidemia, unspecified: Secondary | ICD-10-CM | POA: Insufficient documentation

## 2016-11-19 DIAGNOSIS — I10 Essential (primary) hypertension: Secondary | ICD-10-CM | POA: Insufficient documentation

## 2016-11-19 DIAGNOSIS — N893 Dysplasia of vagina, unspecified: Secondary | ICD-10-CM | POA: Insufficient documentation

## 2016-11-19 DIAGNOSIS — Z86010 Personal history of colonic polyps: Secondary | ICD-10-CM | POA: Insufficient documentation

## 2016-11-19 DIAGNOSIS — N870 Mild cervical dysplasia: Secondary | ICD-10-CM | POA: Insufficient documentation

## 2016-11-19 DIAGNOSIS — Z79899 Other long term (current) drug therapy: Secondary | ICD-10-CM | POA: Insufficient documentation

## 2016-11-19 DIAGNOSIS — L439 Lichen planus, unspecified: Secondary | ICD-10-CM | POA: Insufficient documentation

## 2016-11-19 NOTE — Progress Notes (Deleted)
Gynecologic Oncology Visit   Referring Provider: Dr. Arvil ChacoVan Dalen  Chief Concern: abnormal Pap smears and VAIN1  Subjective:  Maria Kemp is a 60 y.o. female who has a long history of slightly abnormal Pap smears, mainly LGSIL, with VAIN1. 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   Follow up for biopsy proven VAIN1.   We recommended conservative management with repeat Pap/HPV cotesting.   She has no complaints specifically no vaginal bleeding, discharge, pain.***  Gynecologic History Dr Arvil ChacoVan Dalen performed LEEP in 1999 and CKC in 2006 for cervical dysplasia.  Subsequent Pap smears vary between normal and LGSIL with high risk HPV.  Since 2013 her q6 month PAPs have shown ASCUS, Normal, LSIL, LSIL.  No HPV testing or colposcopy was done during that time period.  10/2014 she saw Dr. Johnnette LitterBerchuck. Colposcopy was performed. The transformation zone was not visible, but there were no lesions. Pap NILM and HRHPV negative.   10/2015  LSIL cervical PAP, Vaginal Pap: ASCUS, negative HR HPV   11/2015  Dr Johnnette LitterBerchuck  performed colposcopy and biopsy. Biopsy of small cyst in the vagina just below the cervix was performed.  DIAGNOSIS:  A. VAGINA; BIOPSY:  - INFLAMED VAGINAL MUCOSA WITH A LOW GRADE SQUAMOUS INTRAEPITHELIAL  LESION (VAIN 1) AND PARTIAL BENIGN CYST.   Opted for expectant management  Problem List: Patient Active Problem List   Diagnosis Date Noted  . Vaginal dysplasia 05/21/2016  . Low grade squamous intraepithelial lesion on cytologic smear of cervix (LGSIL) 05/21/2016  . History of colonic polyps 03/17/2016  . H/O sebaceous cyst 09/06/2015  . Hyperlipidemia LDL goal <130 09/06/2015  . Pruritus 03/05/2015  . Lichen planus 09/04/2014  . Essential hypertension, benign 09/04/2014  . Mild cervical dysplasia 09/04/2014  . Family history of colonic polyps 09/04/2014  . H/O rotator cuff tear 09/04/2014    Past Medical History: Past Medical History:  Diagnosis Date  . Colon polyps   .  Hyperlipidemia   . Hypertension     Past Surgical History: Past Surgical History:  Procedure Laterality Date  . ABDOMINOPLASTY  1995  . CESAREAN SECTION  1989    Past Gynecologic History:  Menarche: 11 Menstrual details: postmenopausal  OB History: G3P2  Family History: Family History  Problem Relation Age of Onset  . Arthritis Mother   . Hyperlipidemia Mother   . Hypertension Mother   . Heart disease Mother   . Arthritis Father   . Cancer Father   . Hyperlipidemia Father   . Hypertension Father   . Heart disease Father   . Diabetes Father   . Hyperlipidemia Brother   . Hypertension Brother    Social History: Social History   Social History  . Marital status: Divorced    Spouse name: N/A  . Number of children: N/A  . Years of education: N/A   Occupational History  . Not on file.   Social History Main Topics  . Smoking status: Never Smoker  . Smokeless tobacco: Never Used  . Alcohol use Yes     Comment: rarely  . Drug use: No  . Sexual activity: Not on file   Other Topics Concern  . Not on file   Social History Narrative  . No narrative on file    Allergies: Allergies  Allergen Reactions  . Codeine Anaphylaxis  . Shrimp [Shellfish Allergy] Anaphylaxis    All shell fish    Current Medications: Current Outpatient Prescriptions  Medication Sig Dispense Refill  . amLODipine-atorvastatin (CADUET)  5-10 MG tablet Take by mouth.    Marland Kitchen. CADUET 5-10 MG tablet Take 1 tablet by mouth daily. 30 tablet 6  . Calcium Carbonate-Vitamin D (CALCIUM-VITAMIN D) 500-200 MG-UNIT tablet Take by mouth.    . Calcium Citrate 200 MG TABS Take by mouth.    . Cholecalciferol (D3-1000) 1000 UNITS tablet Take 1,000 Units by mouth daily.    . fexofenadine (ALLEGRA) 180 MG tablet Take 180 mg by mouth daily.    Marland Kitchen. levofloxacin (LEVAQUIN) 500 MG tablet Take 1 tablet (500 mg total) by mouth daily. 7 tablet 0  . Multiple Vitamin (MULTIVITAMIN) tablet Take by mouth.    . potassium  chloride SA (K-DUR,KLOR-CON) 20 MEQ tablet Take 1 tablet (20 mEq total) by mouth daily. With food 30 tablet 0  . Triamcinolone Acetonide (NASACORT ALLERGY 24HR NA) Place 1 spray into the nose daily.     No current facility-administered medications for this visit.     Review of Systems General: no complaints  HEENT: no complaints  Lungs: no complaints  Cardiac: no complaints  GI: no complaints  GU: no complaints  Musculoskeletal: no complaints  Extremities: no complaints  Skin: no complaints  Neuro: no complaints  Endocrine: no complaints  Psych: no complaints     Objective:  Physical Examination:  There were no vitals taken for this visit.   ECOG Performance Status: 0 - Asymptomatic  General appearance: alert, cooperative and appears stated age HEENT:PERRLA, extra ocular movement intact and sclera clear, anicteric Lymph node survey: non-palpable, inguinal Extremities: extremities normal, atraumatic, no cyanosis or edema Neurological exam reveals alert, oriented, normal speech, no focal findings or movement disorder noted.  Pelvic: exam chaperoned by nurse;  Vulva: normal appearing vulva with no masses, tenderness or lesions; Vagina: normal vagina and the previously seen small cyst just below cervix posteriorly is no longer visible. The rest of the exam was deferred from 11/2015 Adnexa: normal adnexa in size, nontender and no masses; Uterus: uterus is normal size, shape, consistency and nontender; Cervix: no lesions, stenotic os; Rectal: confirmatory and revealed lesions in the rectovaginal septum.     Assessment:  Maria Kemp is a 60 y.o. 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 Cervical os stenosis. Benign vaginal cyst.   Plan:   Problem List Items Addressed This Visit    None     Follow up Repeat PAP/HPV and determine management based on results. We previously reviewed ASCCP guidelines for cervical  dysplasia that we can use for vaginal dysplasia. Surgical treatment will be considered if low grade dysplasia persistent for 2 years.  If negative Pap/HPV then she will return in 12 months for follow up and repeat PAP/HPV.    Artelia LarocheSECORD,Lauralyn Shadowens ALVAREZ, MD  CC:  PCP Sherlene Shamseresa L Tullo, MD [address] [place], [place] [address] [phone]

## 2016-11-26 ENCOUNTER — Other Ambulatory Visit: Payer: Self-pay | Admitting: Obstetrics and Gynecology

## 2016-11-26 ENCOUNTER — Inpatient Hospital Stay (HOSPITAL_BASED_OUTPATIENT_CLINIC_OR_DEPARTMENT_OTHER): Payer: BLUE CROSS/BLUE SHIELD | Admitting: Obstetrics and Gynecology

## 2016-11-26 VITALS — BP 118/74 | HR 79 | Temp 97.8°F | Wt 134.0 lb

## 2016-11-26 DIAGNOSIS — E785 Hyperlipidemia, unspecified: Secondary | ICD-10-CM | POA: Diagnosis not present

## 2016-11-26 DIAGNOSIS — Z86010 Personal history of colonic polyps: Secondary | ICD-10-CM | POA: Diagnosis not present

## 2016-11-26 DIAGNOSIS — Z79899 Other long term (current) drug therapy: Secondary | ICD-10-CM

## 2016-11-26 DIAGNOSIS — N870 Mild cervical dysplasia: Secondary | ICD-10-CM

## 2016-11-26 DIAGNOSIS — L439 Lichen planus, unspecified: Secondary | ICD-10-CM

## 2016-11-26 DIAGNOSIS — I10 Essential (primary) hypertension: Secondary | ICD-10-CM | POA: Diagnosis not present

## 2016-11-26 DIAGNOSIS — N893 Dysplasia of vagina, unspecified: Secondary | ICD-10-CM | POA: Diagnosis not present

## 2016-11-26 NOTE — Progress Notes (Signed)
  Oncology Nurse Navigator Documentation Chaperoned pelvic exam. Pap/Hpv sent to lab Navigator Location: [facility] (11/26/16 1500)   )Navigator Encounter Type: Follow-up Appt (11/26/16 1500)                                                    Time Spent with Patient: 15 (11/26/16 1500)

## 2016-11-26 NOTE — Progress Notes (Signed)
Gynecologic Oncology Visit   Referring Provider: Dr. Arvil ChacoVan Dalen  Chief Concern: abnormal Pap smears and VAIN1  Subjective:  Maria KocherRoslyn M Kemp is a 60 y.o. female who has a long history of slightly abnormal Pap smears, mainly LGSIL, often negative biopsies after colposcopy. 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   Returns today for surveillance exam and PAP/HPV  She was seen by Dr Johnnette LitterBerchuck 12/16 for colposcopy given LSIL cervical PAP, Vaginal Pap: ASCUS, negative HR HPV on 11/16. Biopsy of small cyst in the vagina just below the cervix was performed.  DIAGNOSIS:  A. VAGINA; BIOPSY:  - INFLAMED VAGINAL MUCOSA WITH A LOW GRADE SQUAMOUS INTRAEPITHELIAL  LESION (VAIN 1) AND PARTIAL BENIGN CYST.   She has no complaints specifically no vaginal bleeding, discharge, pain.  Gynecologic History Dr Arvil ChacoVan Dalen performed LEEP in 1999 and CKC in 2006 for cervical dysplasia.  Subsequent Pap smears vary between normal and LGSIL with high risk HPV.  Since 2013 her q6 month PAPs have shown ASCUS, Normal, LSIL, LSIL.  No HPV testing or colposcopy was done during that time period.  10/2014 she saw Dr. Johnnette LitterBerchuck. Colposcopy was performed. The transformation zone was not visible, but there were no lesions. Pap NILM and HRHPV negative.   Problem List: Patient Active Problem List   Diagnosis Date Noted  . Vaginal dysplasia 05/21/2016  . Low grade squamous intraepithelial lesion on cytologic smear of cervix (LGSIL) 05/21/2016  . History of colonic polyps 03/17/2016  . H/O sebaceous cyst 09/06/2015  . Hyperlipidemia LDL goal <130 09/06/2015  . Pruritus 03/05/2015  . Lichen planus 09/04/2014  . Essential hypertension, benign 09/04/2014  . Mild cervical dysplasia 09/04/2014  . Family history of colonic polyps 09/04/2014  . H/O rotator cuff tear 09/04/2014    Past Medical History: Past Medical History:  Diagnosis Date  . Colon polyps   . Hyperlipidemia   . Hypertension     Past Surgical History: Past  Surgical History:  Procedure Laterality Date  . ABDOMINOPLASTY  1995  . CESAREAN SECTION  1989    Past Gynecologic History:  Menarche: 11 Menstrual details: postmenopausal  OB History: G3P2  Family History: Family History  Problem Relation Age of Onset  . Arthritis Mother   . Hyperlipidemia Mother   . Hypertension Mother   . Heart disease Mother   . Arthritis Father   . Cancer Father   . Hyperlipidemia Father   . Hypertension Father   . Heart disease Father   . Diabetes Father   . Hyperlipidemia Brother   . Hypertension Brother    Social History: Social History   Social History  . Marital status: Divorced    Spouse name: N/A  . Number of children: N/A  . Years of education: N/A   Occupational History  . Not on file.   Social History Main Topics  . Smoking status: Never Smoker  . Smokeless tobacco: Never Used  . Alcohol use Yes     Comment: rarely  . Drug use: No  . Sexual activity: Not on file   Other Topics Concern  . Not on file   Social History Narrative  . No narrative on file    Allergies: Allergies  Allergen Reactions  . Codeine Anaphylaxis  . Shrimp [Shellfish Allergy] Anaphylaxis    All shell fish    Current Medications: Current Outpatient Prescriptions  Medication Sig Dispense Refill  . amLODipine-atorvastatin (CADUET) 5-10 MG tablet Take by mouth.    Marland Kitchen. CADUET 5-10 MG  tablet Take 1 tablet by mouth daily. 30 tablet 6  . Calcium Carbonate-Vitamin D (CALCIUM-VITAMIN D) 500-200 MG-UNIT tablet Take by mouth.    . Calcium Citrate 200 MG TABS Take by mouth.    . Cholecalciferol (D3-1000) 1000 UNITS tablet Take 1,000 Units by mouth daily.    . fexofenadine (ALLEGRA) 180 MG tablet Take 180 mg by mouth daily.    Marland Kitchen. levofloxacin (LEVAQUIN) 500 MG tablet Take 1 tablet (500 mg total) by mouth daily. 7 tablet 0  . Multiple Vitamin (MULTIVITAMIN) tablet Take by mouth.    . potassium chloride SA (K-DUR,KLOR-CON) 20 MEQ tablet Take 1 tablet (20 mEq  total) by mouth daily. With food 30 tablet 0  . Triamcinolone Acetonide (NASACORT ALLERGY 24HR NA) Place 1 spray into the nose daily.     No current facility-administered medications for this visit.     Review of Systems General: no complaints  HEENT: no complaints  Lungs: no complaints  Cardiac: no complaints  GI: no complaints  GU: no complaints  Musculoskeletal: no complaints  Extremities: no complaints  Skin: no complaints  Neuro: no complaints  Endocrine: no complaints  Psych: no complaints     Objective:  Physical Examination:  BP 118/74 (BP Location: Left Arm, Patient Position: Sitting)   Pulse 79   Temp 97.8 F (36.6 C) (Tympanic)   Wt 134 lb 0.6 oz (60.8 kg)   BMI 23.74 kg/m    ECOG Performance Status: 0 - Asymptomatic  General appearance: alert, cooperative and appears stated age HEENT:PERRLA, extra ocular movement intact and sclera clear, anicteric Lymph node survey: non-palpable, inguinal Extremities: extremities normal, atraumatic, no cyanosis or edema Neurological exam reveals alert, oriented, normal speech, no focal findings or movement disorder noted.  Pelvic: exam chaperoned by nurse;  Vulva: normal appearing vulva with no masses, tenderness or lesions; Vagina: normal vagina and the previously seen small cyst just below cervix posteriorly is no longer visible. The rest of the exam was deferred from 11/2015 Adnexa: normal adnexa in size, nontender and no masses; Uterus: uterus is normal size, shape, consistency and nontender; Cervix: no lesions, stenotic os;     Assessment:  Maria Kemp is a 60 y.o. 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 Cervical os stenosis. Benign vaginal cyst. Normal exam today.  Plan:   Problem List Items Addressed This Visit      Genitourinary   Mild cervical dysplasia   Vaginal dysplasia - Primary     PAP and HPV co-testing done today.  I recommended continued  surveillance with close follow up in 6 months.  Leida LauthAndrew Travius Crochet, MD  CC:  PCP Sherlene Shamseresa L Tullo, MD [address] [place] [address] [phone]

## 2016-11-26 NOTE — Progress Notes (Signed)
Patient ambulates independently, brought to exam room 14.  Patient denies pain or discomfort at this time.

## 2016-12-02 LAB — PAP LB AND HPV HIGH-RISK
HPV, HIGH-RISK: NEGATIVE
PAP SMEAR COMMENT: 0

## 2017-03-11 LAB — HM MAMMOGRAPHY

## 2017-03-13 ENCOUNTER — Encounter: Payer: Self-pay | Admitting: Internal Medicine

## 2017-03-13 LAB — HM MAMMOGRAPHY

## 2017-03-22 ENCOUNTER — Encounter: Payer: Self-pay | Admitting: Internal Medicine

## 2017-03-22 ENCOUNTER — Telehealth: Payer: Self-pay | Admitting: Internal Medicine

## 2017-03-22 DIAGNOSIS — N63 Unspecified lump in unspecified breast: Secondary | ICD-10-CM | POA: Insufficient documentation

## 2017-03-22 NOTE — Telephone Encounter (Signed)
My chart  message sent re:  Dr Letta Moynahan  mammogram was reported as abnormal on the right,  A 9 mm nodule was noted and [facility] will be contacting her for additional images and ultrasound

## 2017-03-23 NOTE — Telephone Encounter (Signed)
Patient had repeat Mammogram diagnostic and [place] same day both came back normal per patient. FYI Sent patient follow up appointment date and time via e-mail with PCP as patient requested Via MyChart.

## 2017-04-20 ENCOUNTER — Other Ambulatory Visit: Payer: Self-pay | Admitting: Internal Medicine

## 2017-05-11 ENCOUNTER — Encounter: Payer: Self-pay | Admitting: Internal Medicine

## 2017-05-11 ENCOUNTER — Ambulatory Visit (INDEPENDENT_AMBULATORY_CARE_PROVIDER_SITE_OTHER): Payer: BLUE CROSS/BLUE SHIELD | Admitting: Internal Medicine

## 2017-05-11 VITALS — BP 110/72 | HR 75 | Temp 98.0°F | Resp 15 | Ht 63.0 in | Wt 136.8 lb

## 2017-05-11 DIAGNOSIS — E7800 Pure hypercholesterolemia, unspecified: Secondary | ICD-10-CM

## 2017-05-11 DIAGNOSIS — N63 Unspecified lump in unspecified breast: Secondary | ICD-10-CM | POA: Diagnosis not present

## 2017-05-11 DIAGNOSIS — I10 Essential (primary) hypertension: Secondary | ICD-10-CM | POA: Diagnosis not present

## 2017-05-11 DIAGNOSIS — R928 Other abnormal and inconclusive findings on diagnostic imaging of breast: Secondary | ICD-10-CM

## 2017-05-11 MED ORDER — EPINEPHRINE 0.3 MG/0.3ML IJ SOAJ: INTRAMUSCULAR | 0 refills | Status: DC

## 2017-05-11 NOTE — Progress Notes (Signed)
Subjective:  Patient ID: Maria Kemp, female    DOB: 25-Jan-1956  Age: 61 y.o. MRN: [id number]  CC: The primary encounter diagnosis was Essential hypertension. Diagnoses of Pure hypercholesterolemia, Abnormal mammogram of right breast, Essential hypertension, benign, and Breast nodule were also pertinent to this visit.  HPI Maria Kemp presents for follow up on hypertensoin.  She was   Last seen in November. Patient is taking her medications as prescribed and notes no adverse effects.  Home BP readings have been done about once per week and are  generally < 130/80 .  She is avoiding added salt in her diet and wokring out with a trainer  about 3-4 times per week for exercise  . Diet is very healthy.  Some increased stress at home due to increased interaction with aging parents who are in declining health.   mammogram April  Had ultrasound done of right breast,  Told everything was normal.  [facility] .  Needs a late Monday afternoon appt   colonoscopy normal in 2013 PAP smear negative Dec 2017  Outpatient Medications Prior to Visit  Medication Sig Dispense Refill  . CADUET 5-10 MG tablet Take 1 tablet by mouth daily. 30 tablet 6  . Calcium Carbonate-Vitamin D (CALCIUM-VITAMIN D) 500-200 MG-UNIT tablet Take by mouth.    . Calcium Citrate 200 MG TABS Take by mouth.    . Cholecalciferol (D3-1000) 1000 UNITS tablet Take 1,000 Units by mouth daily.    . fexofenadine (ALLEGRA) 180 MG tablet Take 180 mg by mouth daily.    . Multiple Vitamin (MULTIVITAMIN) tablet Take by mouth.    . Triamcinolone Acetonide (NASACORT ALLERGY 24HR NA) Place 1 spray into the nose daily.    Marland Kitchen EPINEPHRINE 0.3 mg/0.3 mL IJ SOAJ injection USE AS DIRECTED 2 Device 0  . amLODipine-atorvastatin (CADUET) 5-10 MG tablet Take by mouth.    . levofloxacin (LEVAQUIN) 500 MG tablet Take 1 tablet (500 mg total) by mouth daily. (Patient not taking: Reported on 05/11/2017) 7 tablet 0  . potassium chloride SA  (K-DUR,KLOR-CON) 20 MEQ tablet Take 1 tablet (20 mEq total) by mouth daily. With food (Patient not taking: Reported on 05/11/2017) 30 tablet 0  . triamcinolone (NASACORT) 55 MCG/ACT AERO nasal inhaler Place 55 sprays into the nose.     No facility-administered medications prior to visit.     Review of Systems;  Patient denies headache, fevers, malaise, unintentional weight loss, skin rash, eye pain, sinus congestion and sinus pain, sore throat, dysphagia,  hemoptysis , cough, dyspnea, wheezing, chest pain, palpitations, orthopnea, edema, abdominal pain, nausea, melena, diarrhea, constipation, flank pain, dysuria, hematuria, urinary  Frequency, nocturia, numbness, tingling, seizures,  Focal weakness, Loss of consciousness,  Tremor, insomnia, depression, anxiety, and suicidal ideation.      Objective:  BP 110/72 (BP Location: Left Arm, Patient Position: Sitting, Cuff Size: Normal)   Pulse 75   Temp 98 F (36.7 C) (Oral)   Resp 15   Ht 5\' 3"  (1.6 m)   Wt 136 lb 12.8 oz (62.1 kg)   SpO2 98%   BMI 24.23 kg/m   BP Readings from Last 3 Encounters:  05/11/17 110/72  11/26/16 118/74  10/29/16 132/86    Wt Readings from Last 3 Encounters:  05/11/17 136 lb 12.8 oz (62.1 kg)  11/26/16 134 lb 0.6 oz (60.8 kg)  10/29/16 134 lb 8 oz (61 kg)    General appearance: alert, cooperative and appears stated age Ears: normal TM's  and external ear canals both ears Throat: lips, mucosa, and tongue normal; teeth and gums normal Neck: no adenopathy, no carotid bruit, supple, symmetrical, trachea midline and thyroid not enlarged, symmetric, no tenderness/mass/nodules Back: symmetric, no curvature. ROM normal. No CVA tenderness. Lungs: clear to auscultation bilaterally Heart: regular rate and rhythm, S1, S2 normal, no murmur, click, rub or gallop Abdomen: soft, non-tender; bowel sounds normal; no masses,  no organomegaly Pulses: 2+ and symmetric Skin: Skin color, texture, turgor normal. No rashes or  lesions Lymph nodes: Cervical, supraclavicular, and axillary nodes normal.  No results found for: HGBA1C  Lab Results  Component Value Date   CREATININE 0.72 11/11/2016   CREATININE 1.03 (H) 10/29/2016   CREATININE 0.83 03/05/2016    Lab Results  Component Value Date   WBC 3.9 (L) 03/05/2016   HGB 12.7 03/05/2016   HCT 38.2 03/05/2016   PLT 320.0 03/05/2016   GLUCOSE 94 11/11/2016   CHOL 179 03/05/2015   TRIG 75.0 03/05/2015   HDL 62.00 03/05/2015   LDLDIRECT 90.0 03/05/2016   LDLCALC 102 (H) 03/05/2015   ALT 21 10/29/2016   AST 24 10/29/2016   NA 140 11/11/2016   K 3.3 (L) 11/11/2016   CL 100 11/11/2016   CREATININE 0.72 11/11/2016   BUN 10 11/11/2016   CO2 33 (H) 11/11/2016   TSH 0.69 09/07/2014   MICROALBUR <0.7 11/11/2016    No results found.  Assessment & Plan:   Problem List Items Addressed This Visit    Essential hypertension, benign    Well controlled on current regimen. Renal function has been stable, no changes today pending repeat evaluation of gfr and lytes.   Lab Results  Component Value Date   CREATININE 0.72 11/11/2016   Lab Results  Component Value Date   NA 140 11/11/2016   K 3.3 (L) 11/11/2016   CL 100 11/11/2016   CO2 33 (H) 11/11/2016         Relevant Medications   EPINEPHrine 0.3 mg/0.3 mL IJ SOAJ injection   Breast nodule    Calcifications were noted during diagnostic ultrasound  of right breast.  She is apprehensive about waiting a year for follow up and would like a second opinion from a breast surgeon,.  [facility] [place] .         Other Visit Diagnoses    Essential hypertension    -  Primary   Relevant Medications   EPINEPHrine 0.3 mg/0.3 mL IJ SOAJ injection   Other Relevant Orders   Microalbumin / creatinine urine ratio   Comprehensive metabolic panel   Pure hypercholesterolemia       Relevant Medications   EPINEPHrine 0.3 mg/0.3 mL IJ SOAJ injection   Other Relevant Orders   Lipid panel   Abnormal  mammogram of right breast       Relevant Orders   Ambulatory referral to General Surgery    A total of 25 minutes of face to face time was spent with patient more than half of which was spent in reviewing prior films, counselling about the above mentioned conditions  and coordination of care   I have discontinued Ms. Strohmeier's amLODipine-atorvastatin, levofloxacin, potassium chloride SA, and triamcinolone. I am also having her maintain her Triamcinolone Acetonide (NASACORT ALLERGY 24HR NA), fexofenadine, Cholecalciferol, Calcium Citrate, calcium-vitamin D, multivitamin, CADUET, and EPINEPHrine.  Meds ordered this encounter  Medications  . EPINEPHrine 0.3 mg/0.3 mL IJ SOAJ injection    Sig: USE AS DIRECTED    Dispense:  2 Device    Refill:  0    Medications Discontinued During This Encounter  Medication Reason  . amLODipine-atorvastatin (CADUET) 5-10 MG tablet Duplicate  . levofloxacin (LEVAQUIN) 500 MG tablet Patient has not taken in last 30 days  . potassium chloride SA (K-DUR,KLOR-CON) 20 MEQ tablet Patient has not taken in last 30 days  . triamcinolone (NASACORT) 55 MCG/ACT AERO nasal inhaler Duplicate  . EPINEPHRINE 0.3 mg/0.3 mL IJ SOAJ injection Reorder    Follow-up: Return for needs fasting labs this thursday 8 am .   Sherlene Shams, MD

## 2017-05-12 LAB — MICROALBUMIN / CREATININE URINE RATIO
Creatinine,U: 287.3 mg/dL
MICROALB/CREAT RATIO: 0.9 mg/g (ref 0.0–30.0)
Microalb, Ur: 2.6 mg/dL — ABNORMAL HIGH (ref 0.0–1.9)

## 2017-05-12 NOTE — Assessment & Plan Note (Signed)
Calcifications were noted during diagnostic ultrasound  of right breast.  She is apprehensive about waiting a year for follow up and would like a second opinion from a breast surgeon,.  [facility] [place] .

## 2017-05-12 NOTE — Assessment & Plan Note (Signed)
Well controlled on current regimen. Renal function has been stable, no changes today pending repeat evaluation of gfr and lytes.   Lab Results  Component Value Date   CREATININE 0.72 11/11/2016   Lab Results  Component Value Date   NA 140 11/11/2016   K 3.3 (L) 11/11/2016   CL 100 11/11/2016   CO2 33 (H) 11/11/2016

## 2017-05-14 ENCOUNTER — Other Ambulatory Visit (INDEPENDENT_AMBULATORY_CARE_PROVIDER_SITE_OTHER): Payer: BLUE CROSS/BLUE SHIELD

## 2017-05-14 DIAGNOSIS — I10 Essential (primary) hypertension: Secondary | ICD-10-CM

## 2017-05-14 DIAGNOSIS — E7800 Pure hypercholesterolemia, unspecified: Secondary | ICD-10-CM

## 2017-05-14 LAB — COMPREHENSIVE METABOLIC PANEL
ALT: 17 U/L (ref 0–35)
AST: 19 U/L (ref 0–37)
Albumin: 4.4 g/dL (ref 3.5–5.2)
Alkaline Phosphatase: 70 U/L (ref 39–117)
BUN: 13 mg/dL (ref 6–23)
CHLORIDE: 101 meq/L (ref 96–112)
CO2: 32 mEq/L (ref 19–32)
CREATININE: 0.87 mg/dL (ref 0.40–1.20)
Calcium: 9.5 mg/dL (ref 8.4–10.5)
GFR: 84.91 mL/min (ref >60.00)
Glucose, Bld: 105 mg/dL — ABNORMAL HIGH (ref 70–99)
Potassium: 3.7 mEq/L (ref 3.5–5.1)
SODIUM: 140 meq/L (ref 135–145)
Total Bilirubin: 0.4 mg/dL (ref 0.2–1.2)
Total Protein: 7.4 g/dL (ref 6.0–8.3)

## 2017-05-14 LAB — LIPID PANEL
CHOL/HDL RATIO: 3
Cholesterol: 169 mg/dL (ref 0–200)
HDL: 58 mg/dL (ref >39.00)
LDL CALC: 99 mg/dL (ref 0–99)
NonHDL: 110.98
TRIGLYCERIDES: 59 mg/dL (ref 0.0–149.0)
VLDL: 11.8 mg/dL (ref 0.0–40.0)

## 2017-05-27 ENCOUNTER — Ambulatory Visit: Payer: BLUE CROSS/BLUE SHIELD

## 2017-06-03 ENCOUNTER — Inpatient Hospital Stay: Payer: BLUE CROSS/BLUE SHIELD

## 2017-06-17 ENCOUNTER — Other Ambulatory Visit: Payer: Self-pay | Admitting: Obstetrics and Gynecology

## 2017-06-17 ENCOUNTER — Inpatient Hospital Stay: Payer: BLUE CROSS/BLUE SHIELD | Attending: Obstetrics and Gynecology | Admitting: Obstetrics and Gynecology

## 2017-06-17 ENCOUNTER — Encounter: Payer: Self-pay | Admitting: Internal Medicine

## 2017-06-17 VITALS — BP 117/74 | HR 78 | Temp 97.8°F | Ht 63.0 in | Wt 138.1 lb

## 2017-06-17 DIAGNOSIS — I10 Essential (primary) hypertension: Secondary | ICD-10-CM | POA: Insufficient documentation

## 2017-06-17 DIAGNOSIS — Z780 Asymptomatic menopausal state: Secondary | ICD-10-CM

## 2017-06-17 DIAGNOSIS — Z86010 Personal history of colonic polyps: Secondary | ICD-10-CM | POA: Insufficient documentation

## 2017-06-17 DIAGNOSIS — N870 Mild cervical dysplasia: Secondary | ICD-10-CM | POA: Diagnosis not present

## 2017-06-17 DIAGNOSIS — E785 Hyperlipidemia, unspecified: Secondary | ICD-10-CM | POA: Diagnosis not present

## 2017-06-17 NOTE — Progress Notes (Signed)
  Oncology Nurse Navigator Documentation Chaperoned exam. Pap sent to lab. Navigator Location: [facility] (06/17/17 1500)   )Navigator Encounter Type: Follow-up Appt (06/17/17 1500)                     Patient Visit Type: GynOnc (06/17/17 1500)                              Time Spent with Patient: 15 (06/17/17 1500)

## 2017-06-17 NOTE — Progress Notes (Signed)
Patient here for follow up. No changes since last appointment.  

## 2017-06-17 NOTE — Progress Notes (Signed)
Gynecologic Oncology Visit   Referring Provider: Dr. Arvil ChacoVan Dalen  Chief Concern: abnormal Pap smears and VAIN1  Subjective:  Maria Kemp is a 61 y.o. female who has a long history of slightly abnormal Pap smears, mainly LGSIL, often negative biopsies after colposcopy. 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   Returns today for surveillance exam.  PAP/HPV normal 12/17.   She has no complaints specifically no vaginal bleeding, discharge, pain.  Gynecologic History Dr Arvil ChacoVan Dalen performed LEEP in 1999 and CKC in 2006 for cervical dysplasia.  Subsequent Pap smears vary between normal and LGSIL with high risk HPV.  Since 2013 her q6 month PAPs have shown ASCUS, Normal, LSIL, LSIL.  No HPV testing or colposcopy was done during that time period.  10/2014 she saw Dr. Johnnette LitterBerchuck. Colposcopy was performed. The transformation zone was not visible, but there were no lesions. Pap NILM and HR-HPV negative.   She was seen by Dr Johnnette LitterBerchuck 12/16 for colposcopy given LSIL cervical PAP, Vaginal Pap: ASCUS, negative HR HPV on 11/16. Biopsy of small cyst in the vagina just below the cervix was performed.  DIAGNOSIS:  A. VAGINA; BIOPSY:  - INFLAMED VAGINAL MUCOSA WITH A LOW GRADE SQUAMOUS INTRAEPITHELIAL  LESION (VAIN 1) AND PARTIAL BENIGN CYST.   Problem List: Patient Active Problem List   Diagnosis Date Noted  . Breast nodule 03/22/2017  . Vaginal dysplasia 05/21/2016  . Low grade squamous intraepithelial lesion on cytologic smear of cervix (LGSIL) 05/21/2016  . History of colonic polyps 03/17/2016  . H/O sebaceous cyst 09/06/2015  . Hyperlipidemia LDL goal <130 09/06/2015  . Pruritus 03/05/2015  . Lichen planus 09/04/2014  . Essential hypertension, benign 09/04/2014  . Mild cervical dysplasia 09/04/2014  . Family history of colonic polyps 09/04/2014  . H/O rotator cuff tear 09/04/2014    Past Medical History: Past Medical History:  Diagnosis Date  . Colon polyps   . Hyperlipidemia   .  Hypertension     Past Surgical History: Past Surgical History:  Procedure Laterality Date  . ABDOMINOPLASTY  1995  . CESAREAN SECTION  1989    Past Gynecologic History:  Menarche: 11 Menstrual details: postmenopausal  OB History: G3P2  Family History: Family History  Problem Relation Age of Onset  . Arthritis Mother   . Hyperlipidemia Mother   . Hypertension Mother   . Heart disease Mother   . Arthritis Father   . Cancer Father   . Hyperlipidemia Father   . Hypertension Father   . Heart disease Father   . Diabetes Father   . Hyperlipidemia Brother   . Hypertension Brother    Social History: Social History   Social History  . Marital status: Divorced    Spouse name: N/A  . Number of children: N/A  . Years of education: N/A   Occupational History  . Not on file.   Social History Main Topics  . Smoking status: Never Smoker  . Smokeless tobacco: Never Used  . Alcohol use Yes     Comment: rarely  . Drug use: No  . Sexual activity: Not on file   Other Topics Concern  . Not on file   Social History Narrative  . No narrative on file    Allergies: Allergies  Allergen Reactions  . Codeine Anaphylaxis  . Shrimp [Shellfish Allergy] Anaphylaxis    All shell fish    Current Medications: Current Outpatient Prescriptions  Medication Sig Dispense Refill  . CADUET 5-10 MG tablet Take 1 tablet  by mouth daily. 30 tablet 6  . Calcium Carbonate-Vitamin D (CALCIUM-VITAMIN D) 500-200 MG-UNIT tablet Take by mouth.    . Calcium Citrate 200 MG TABS Take by mouth.    . Cholecalciferol (D3-1000) 1000 UNITS tablet Take 1,000 Units by mouth daily.    Marland Kitchen EPINEPHrine 0.3 mg/0.3 mL IJ SOAJ injection USE AS DIRECTED 2 Device 0  . loratadine (CLARITIN) 10 MG tablet Take 10 mg by mouth daily.    . Multiple Vitamin (MULTIVITAMIN) tablet Take by mouth.    . Triamcinolone Acetonide (NASACORT ALLERGY 24HR NA) Place 1 spray into the nose daily.     No current facility-administered  medications for this visit.     Review of Systems General: no complaints  HEENT: no complaints  Lungs: no complaints  Cardiac: no complaints  GI: no complaints  GU: no complaints  Musculoskeletal: no complaints  Extremities: no complaints  Skin: no complaints  Neuro: no complaints  Endocrine: no complaints  Psych: no complaints     Objective:  Physical Examination:  BP 117/74 (BP Location: Left Arm, Patient Position: Sitting)   Pulse 78   Temp 97.8 F (36.6 C) (Tympanic)   Ht 5\' 3"  (1.6 m)   Wt 138 lb 1.6 oz (62.6 kg)   BMI 24.46 kg/m    ECOG Performance Status: 0 - Asymptomatic  General appearance: alert, cooperative and appears stated age HEENT:PERRLA, extra ocular movement intact and sclera clear, anicteric Abdomen: soft. No masses Lymph node survey: non-palpable, inguinal Extremities: extremities normal, atraumatic, no cyanosis or edema Neurological exam reveals alert, oriented, normal speech, no focal findings or movement disorder noted.  Pelvic: exam chaperoned by nurse;  Vulva: normal appearing vulva with no masses, tenderness or lesions; Vagina: normal vagina. Cervix flush with vagina, normal to inspection and palpation.  Os stenotic. Adnexa: normal adnexa in size, nontender and no masses; Uterus: uterus is normal size, shape, consistency and nontender;     Assessment:  Maria Kemp is a 61 y.o. 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 Cervical os stenosis.  PAP/HPV negative 12/17. Normal exam today.  Plan:   Problem List Items Addressed This Visit      Genitourinary   Mild cervical dysplasia - Primary     PAP done today.  I recommended continued surveillance with close follow up in 8 months.  Leida Lauth, MD  CC:  PCP Sherlene Shams, MD [address] [place], [place] [address] [phone]

## 2017-06-18 MED ORDER — CADUET 5-10 MG PO TABS: 1.0000 | ORAL_TABLET | Freq: Every day | ORAL | 6 refills | Status: DC

## 2017-06-19 ENCOUNTER — Other Ambulatory Visit: Payer: Self-pay | Admitting: Internal Medicine

## 2017-06-19 ENCOUNTER — Telehealth: Payer: Self-pay | Admitting: Internal Medicine

## 2017-06-20 LAB — PAP LB (LIQUID-BASED): PAP Smear Comment: 0

## 2017-07-01 ENCOUNTER — Telehealth: Payer: Self-pay | Admitting: *Deleted

## 2017-07-01 NOTE — Telephone Encounter (Signed)
Contacted patient regarding her results from PAP. Patient was aware of results. Requested that patient make follow up appointment in 6 months per Dr. Johnnette LitterBerchuck. Patient verbalized understanding.

## 2017-07-13 ENCOUNTER — Other Ambulatory Visit: Payer: Self-pay | Admitting: Internal Medicine

## 2017-09-02 NOTE — Telephone Encounter (Signed)
Error

## 2017-09-07 NOTE — Telephone Encounter (Signed)
Error

## 2017-11-02 ENCOUNTER — Ambulatory Visit: Payer: BLUE CROSS/BLUE SHIELD | Admitting: Internal Medicine

## 2017-11-02 ENCOUNTER — Encounter: Payer: Self-pay | Admitting: Internal Medicine

## 2017-11-02 VITALS — BP 118/86 | HR 68 | Temp 98.1°F | Resp 14 | Ht 63.0 in | Wt 144.4 lb

## 2017-11-02 DIAGNOSIS — R5383 Other fatigue: Secondary | ICD-10-CM

## 2017-11-02 DIAGNOSIS — E785 Hyperlipidemia, unspecified: Secondary | ICD-10-CM | POA: Diagnosis not present

## 2017-11-02 DIAGNOSIS — I10 Essential (primary) hypertension: Secondary | ICD-10-CM | POA: Diagnosis not present

## 2017-11-02 DIAGNOSIS — N63 Unspecified lump in unspecified breast: Secondary | ICD-10-CM | POA: Diagnosis not present

## 2017-11-02 NOTE — Patient Instructions (Addendum)
Dr.  Donell BeersByerly did order MRI of both breasts but I do not see it ordered in the system    I will check around to see if there is any one available to help out for 3 hours at night

## 2017-11-02 NOTE — Progress Notes (Signed)
Subjective:  Patient ID: Maria KocherRoslyn M Kemp, female    DOB: Apr 16, 1956  Age: 61 y.o. MRN: [id number]  CC: The primary encounter diagnosis was Essential hypertension, benign. Diagnoses of Hyperlipidemia LDL goal <130, Caregiver with fatigue, and Breast nodule were also pertinent to this visit.  HPI Maria Kemp presents for follow up on hypertension. Patient is taking her medications as prescribed and notes no adverse effects.  Home BP readings have been done about once per week and are  generally < 130/80 .  She is avoiding added salt in her diet and working out about 4 times per week for exercise  .   Abnormal PAP July  LGSIL   6 month follow up [place]   Incomplete mammogram ;April  Right breast ultrasound was done and ("normal")  [facility] , [facility] was not concerned,  attributed changes to dense breasts   Wt gain of 8 lbs since her last visit .  Diet, exercise and metabolism discussed in detail.   Caregiver fatigue: comes hoe after working a full day, then has her parents to care for .  admits that she needs help at night;  both parents heavy and  Immobile.   Takes a Cytogeneticistpribiotic    Saw chiropractor for upper thoracic pain. [place].  Used traction and cold laser. Pain improving   Outpatient Medications Prior to Visit  Medication Sig Dispense Refill  . CADUET 5-10 MG tablet TAKE ONE TABLET BY MOUTH EVERY DAY 30 tablet 3  . Calcium Carbonate-Vitamin D (CALCIUM-VITAMIN D) 500-200 MG-UNIT tablet Take by mouth.    . Calcium Citrate 200 MG TABS Take by mouth.    . Cholecalciferol (D3-1000) 1000 UNITS tablet Take 1,000 Units by mouth daily.    Marland Kitchen. EPINEPHrine 0.3 mg/0.3 mL IJ SOAJ injection USE AS DIRECTED 2 Device 0  . loratadine (CLARITIN) 10 MG tablet Take 10 mg by mouth daily.    . Multiple Vitamin (MULTIVITAMIN) tablet Take by mouth.    . Triamcinolone Acetonide (NASACORT ALLERGY 24HR NA) Place 1 spray into the nose daily.    Marland Kitchen. CADUET 5-10 MG tablet Take 1 tablet by mouth daily.  (Patient not taking: Reported on 11/02/2017) 30 tablet 6   No facility-administered medications prior to visit.     Review of Systems;  Patient denies headache, fevers, malaise, unintentional weight loss, skin rash, eye pain, sinus congestion and sinus pain, sore throat, dysphagia,  hemoptysis , cough, dyspnea, wheezing, chest pain, palpitations, orthopnea, edema, abdominal pain, nausea, melena, diarrhea, constipation, flank pain, dysuria, hematuria, urinary  Frequency, nocturia, numbness, tingling, seizures,  Focal weakness, Loss of consciousness,  Tremor, insomnia, depression, anxiety, and suicidal ideation.      Objective:  BP 118/86 (BP Location: Left Arm, Patient Position: Sitting, Cuff Size: Normal)   Pulse 68   Temp 98.1 F (36.7 C) (Oral)   Resp 14   Ht 5\' 3"  (1.6 m)   Wt 144 lb 6.4 oz (65.5 kg)   SpO2 97%   BMI 25.58 kg/m   BP Readings from Last 3 Encounters:  11/02/17 118/86  06/17/17 117/74  05/11/17 110/72    Wt Readings from Last 3 Encounters:  11/02/17 144 lb 6.4 oz (65.5 kg)  06/17/17 138 lb 1.6 oz (62.6 kg)  05/11/17 136 lb 12.8 oz (62.1 kg)    General appearance: alert, cooperative and appears stated age Ears: normal TM's and external ear canals both ears Throat: lips, mucosa, and tongue normal; teeth and gums normal Neck: no adenopathy,  no carotid bruit, supple, symmetrical, trachea midline and thyroid not enlarged, symmetric, no tenderness/mass/nodules Back: symmetric, no curvature. ROM normal. No CVA tenderness. Lungs: clear to auscultation bilaterally Heart: regular rate and rhythm, S1, S2 normal, no murmur, click, rub or gallop Abdomen: soft, non-tender; bowel sounds normal; no masses,  no organomegaly Pulses: 2+ and symmetric Skin: Skin color, texture, turgor normal. No rashes or lesions Lymph nodes: Cervical, supraclavicular, and axillary nodes normal.  No results found for: HGBA1C  Lab Results  Component Value Date   CREATININE 1.15  11/02/2017   CREATININE 0.87 05/14/2017   CREATININE 0.72 11/11/2016    Lab Results  Component Value Date   WBC 3.9 (L) 03/05/2016   HGB 12.7 03/05/2016   HCT 38.2 03/05/2016   PLT 320.0 03/05/2016   GLUCOSE 90 11/02/2017   CHOL 169 05/14/2017   TRIG 59.0 05/14/2017   HDL 58.00 05/14/2017   LDLDIRECT 90.0 03/05/2016   LDLCALC 99 05/14/2017   ALT 20 11/02/2017   AST 21 11/02/2017   NA 140 11/02/2017   K 3.9 11/02/2017   CL 102 11/02/2017   CREATININE 1.15 11/02/2017   BUN 14 11/02/2017   CO2 32 11/02/2017   TSH 0.69 09/07/2014   MICROALBUR 2.6 (H) 05/11/2017    No results found.  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  Encouraged to hire additional caregiver for 3 hours per night to give her assistance once home.       Essential hypertension, benign - Primary    Well controlled on current regimen. Renal function stable, no changes today.  Lab Results  Component Value Date   CREATININE 1.15 11/02/2017   Lab Results  Component Value Date   NA 140 11/02/2017   K 3.9 11/02/2017   CL 102 11/02/2017   CO2 32 11/02/2017         Relevant Orders   Comprehensive metabolic panel (Completed)   Hyperlipidemia LDL goal <130    Managed with 10 mg atorvastatin  (Caduet) . She has no side effects and will return for fasting labs and liver enzymes . No changes today. Nonfasting LDl is 90  Lab Results  Component Value Date   CHOL 169 05/14/2017   HDL 58.00 05/14/2017   LDLCALC 99 05/14/2017   LDLDIRECT 90.0 03/05/2016   TRIG 59.0 05/14/2017   CHOLHDL 3 05/14/2017   Lab Results  Component Value Date   ALT 20 11/02/2017   AST 21 11/02/2017   ALKPHOS 69 11/02/2017   BILITOT 0.3 11/02/2017               I am  having Maria Kemp maintain her Triamcinolone Acetonide (NASACORT ALLERGY 24HR NA), Cholecalciferol, Calcium Citrate, calcium-vitamin D, multivitamin, EPINEPHrine, loratadine, and CADUET.  No orders of the defined types were placed in this encounter.   Medications Discontinued During This Encounter  Medication Reason  . CADUET 5-10 MG tablet Duplicate    Follow-up: No Follow-up on file.   Sherlene Shamseresa L Analyn Matusek, MD

## 2017-11-03 LAB — COMPREHENSIVE METABOLIC PANEL
ALT: 20 U/L (ref 0–35)
AST: 21 U/L (ref 0–37)
Albumin: 4.3 g/dL (ref 3.5–5.2)
Alkaline Phosphatase: 69 U/L (ref 39–117)
BILIRUBIN TOTAL: 0.3 mg/dL (ref 0.2–1.2)
BUN: 14 mg/dL (ref 6–23)
CALCIUM: 9.6 mg/dL (ref 8.4–10.5)
CHLORIDE: 102 meq/L (ref 96–112)
CO2: 32 meq/L (ref 19–32)
CREATININE: 1.15 mg/dL (ref 0.40–1.20)
GFR: 61.44 mL/min (ref >60.00)
Glucose, Bld: 90 mg/dL (ref 70–99)
Potassium: 3.9 mEq/L (ref 3.5–5.1)
Sodium: 140 mEq/L (ref 135–145)
Total Protein: 7.2 g/dL (ref 6.0–8.3)

## 2017-11-04 ENCOUNTER — Encounter: Payer: Self-pay | Admitting: Internal Medicine

## 2017-11-04 DIAGNOSIS — R5383 Other fatigue: Secondary | ICD-10-CM | POA: Insufficient documentation

## 2017-11-04 NOTE — Assessment & Plan Note (Signed)
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  Encouraged to hire additional caregiver for 3 hours per night to give her assistance once home.

## 2017-11-04 NOTE — Assessment & Plan Note (Signed)
She has had a second opinion by Dr Donell BeersByerly, who has recommended bilateral breast MRI

## 2017-11-04 NOTE — Assessment & Plan Note (Signed)
Managed with 10 mg atorvastatin  (Caduet) . She has no side effects and will return for fasting labs and liver enzymes . No changes today. Nonfasting LDl is 90  Lab Results  Component Value Date   CHOL 169 05/14/2017   HDL 58.00 05/14/2017   LDLCALC 99 05/14/2017   LDLDIRECT 90.0 03/05/2016   TRIG 59.0 05/14/2017   CHOLHDL 3 05/14/2017   Lab Results  Component Value Date   ALT 20 11/02/2017   AST 21 11/02/2017   ALKPHOS 69 11/02/2017   BILITOT 0.3 11/02/2017

## 2017-11-04 NOTE — Assessment & Plan Note (Signed)
Well controlled on current regimen. Renal function stable, no changes today.  Lab Results  Component Value Date   CREATININE 1.15 11/02/2017   Lab Results  Component Value Date   NA 140 11/02/2017   K 3.9 11/02/2017   CL 102 11/02/2017   CO2 32 11/02/2017

## 2017-12-19 ENCOUNTER — Other Ambulatory Visit: Payer: Self-pay | Admitting: Internal Medicine

## 2017-12-22 ENCOUNTER — Telehealth: Payer: Self-pay | Admitting: Internal Medicine

## 2017-12-22 ENCOUNTER — Other Ambulatory Visit: Payer: Self-pay

## 2017-12-22 MED ORDER — CADUET 5-10 MG PO TABS: 1.0000 | ORAL_TABLET | Freq: Every day | ORAL | 3 refills | Status: DC

## 2017-12-22 NOTE — Telephone Encounter (Signed)
Copied from CRM [id number]. Topic: Quick Communication - Rx Refill/Question >> Dec 22, 2017  9:50 AM Rudi CocoLathan, Jaycen Vercher M, NT wrote: Medication: Caduet   Has the patient contacted their pharmacy? yes   (Agent: If no, request that the patient contact the pharmacy for the refill.)   Preferred Pharmacy (with phone number or street name):[facility] [place] - [address] [place] [address] Phone: [phone] Fax: [phone]     Agent: Please be advised that RX refills may take up to 3 business days. We ask that you follow-up with your pharmacy.

## 2017-12-23 ENCOUNTER — Encounter: Payer: Self-pay | Admitting: Internal Medicine

## 2017-12-23 MED ORDER — CADUET 5-10 MG PO TABS: 1.0000 | ORAL_TABLET | Freq: Every day | ORAL | 3 refills | Status: DC

## 2017-12-23 NOTE — Telephone Encounter (Signed)
Rx has been resent as brand name only.

## 2017-12-23 NOTE — Telephone Encounter (Signed)
Please advise 

## 2017-12-23 NOTE — Telephone Encounter (Signed)
Copied from CRM [id number]. Topic: Quick Communication - Rx Refill/Question >> Dec 22, 2017  9:50 AM Rudi CocoLathan, Latoya M, NT wrote: Medication: Caduet   Has the patient contacted their pharmacy? yes   (Agent: If no, request that the patient contact the pharmacy for the refill.)   Preferred Pharmacy (with phone number or street name):[facility] [place] - [address] [place] [place] [address] Phone: [phone] Fax: [phone]     Agent: Please be advised that RX refills may take up to 3 business days. We ask that you follow-up with your pharmacy.  >> Dec 23, 2017 11:33 AM Celedonio MiyamotoBell, Tiffany M wrote:  Relation to pt: self Call back number: [phone] [facility] [place], [place] - [address] [phone] (Phone) [phone] (Fax)     Reason for call:  patient cant take generic verison of CADUET 5-10 MG tablet, brand only and would like a new Rx sent today, patient contacted pharmacy and stated 2nd day with out her medication, please advise patient directly.

## 2017-12-24 ENCOUNTER — Encounter: Payer: Self-pay | Admitting: Internal Medicine

## 2017-12-24 ENCOUNTER — Other Ambulatory Visit: Payer: Self-pay

## 2017-12-24 MED ORDER — CADUET 5-10 MG PO TABS: 1.0000 | ORAL_TABLET | Freq: Every day | ORAL | 3 refills | Status: DC

## 2017-12-24 MED ORDER — AMLODIPINE-ATORVASTATIN 5-10 MG PO TABS: 1.0000 | ORAL_TABLET | Freq: Every day | ORAL | 1 refills | Status: DC

## 2017-12-24 NOTE — Telephone Encounter (Signed)
Spoke to Maria Kemp, told her Rx was sent to pharmacy for Brand name only but now needs prior auth and Dr. Melina Schoolsullo's nurse is working on that. As soon as prescription is approved we will let you know. Maria Kemp verbalized understanding.

## 2017-12-25 ENCOUNTER — Encounter: Payer: Self-pay | Admitting: Internal Medicine

## 2017-12-28 ENCOUNTER — Encounter: Payer: Self-pay | Admitting: Internal Medicine

## 2017-12-29 ENCOUNTER — Encounter: Payer: Self-pay | Admitting: Internal Medicine

## 2018-01-05 NOTE — Telephone Encounter (Signed)
Was finally able to speak with someone with [employer] to figure out what is going on with the prior authorization that was submitted back at the beginning of January for the Caduet. [employer] stated that the PA was denied on 12/28/2017. It was resubmitted with the information that Dr. Metta Clinesrisp stated that they needed and denied it again on 01/02/2018. [employer] is now saying that a written appeal is needed from the provider in order to reconsider the approval. The pt states that she can not take the generic Caduet due to being allergic to it. The pt states that when she takes the generic she starts itching and it gets worse the longer she is taking it.

## 2018-01-06 MED ORDER — AMLODIPINE BESYLATE 5 MG PO TABS: 5.0000 mg | ORAL_TABLET | Freq: Every day | ORAL | 5 refills | Status: DC

## 2018-01-06 NOTE — Telephone Encounter (Signed)
DISCUSSED WITH DR Aja.  NAEM BRAND IS $4000/30 DAYS .  AGREED TO TRIAL OF NORVASC 5 MG IF NOT TOO $$$ AND NO STATIN FOR NOW.ay still need to do a PA for noravasc.  IF TOO $$$ WILL TRY GENERIC NORVASC.

## 2018-02-08 ENCOUNTER — Ambulatory Visit: Payer: BLUE CROSS/BLUE SHIELD | Admitting: Internal Medicine

## 2018-02-08 DIAGNOSIS — Z0289 Encounter for other administrative examinations: Secondary | ICD-10-CM

## 2018-02-12 ENCOUNTER — Encounter: Payer: Self-pay | Admitting: Internal Medicine

## 2018-02-17 ENCOUNTER — Ambulatory Visit: Payer: BLUE CROSS/BLUE SHIELD

## 2018-03-01 ENCOUNTER — Encounter: Payer: Self-pay | Admitting: Internal Medicine

## 2018-03-01 ENCOUNTER — Ambulatory Visit: Payer: BLUE CROSS/BLUE SHIELD | Admitting: Internal Medicine

## 2018-03-01 VITALS — BP 124/82 | HR 75 | Temp 98.2°F | Resp 14 | Ht 63.0 in | Wt 145.6 lb

## 2018-03-01 DIAGNOSIS — Z639 Problem related to primary support group, unspecified: Secondary | ICD-10-CM | POA: Diagnosis not present

## 2018-03-01 DIAGNOSIS — R5383 Other fatigue: Secondary | ICD-10-CM

## 2018-03-01 DIAGNOSIS — H1013 Acute atopic conjunctivitis, bilateral: Secondary | ICD-10-CM

## 2018-03-01 DIAGNOSIS — I10 Essential (primary) hypertension: Secondary | ICD-10-CM | POA: Diagnosis not present

## 2018-03-01 DIAGNOSIS — N63 Unspecified lump in unspecified breast: Secondary | ICD-10-CM

## 2018-03-01 MED ORDER — OLOPATADINE HCL 0.1 % OP SOLN: 1.0000 [drp] | Freq: Two times a day (BID) | OPHTHALMIC | 12 refills | Status: DC

## 2018-03-01 NOTE — Patient Instructions (Signed)
Referral to Evalina FieldJane Perrin in progress  I recommend that you ask your mother's physician  to have her evaluated by a geriatric psychiatrist   See you in 6 months  Dr Donell BeersByerly will order your MRI

## 2018-03-01 NOTE — Progress Notes (Signed)
Subjective:  Patient ID: Maria Kemp, female    DOB: June 24, 1956  Age: 62 y.o. MRN: [id number]  CC: The primary encounter diagnosis was Conflict between patient and family. Diagnoses of Essential hypertension, benign, Breast nodule, Caregiver with fatigue, and Allergic conjunctivitis of both eyes were also pertinent to this visit.  HPI Maria Kemp presents for follow up on multiple issues  1) hypertension  Managed with Caduet until insurance stopped covering it.  Has history of intolerance to other brands of caduet .  She has been tolerating an alternative brand [facility];  filling her prescription with [facility] and home readings have been < 130/80  2) caregiver fatigue:  She continues to have Conflict with elderly mother, who has been exhibiting cognitive changes including paranoia .  She requesting referral to Evalina FieldJane Perrin   A total of 25 minutes of face to face time was spent with patient more than half of which was spent in counselling about the above mentioned conditions  and coordination of care   3) breast calcifications:  She has a follow up appointment  with Dr Donell BeersByerly .  She is wondering why she never was called to have the MRI.  Reviewed Dr Simonne MartinetByerlely's note from June 2018.  An MRI was ordered.    Outpatient Medications Prior to Visit  Medication Sig Dispense Refill  . amLODipine-atorvastatin (CADUET) 5-10 MG tablet Take 1 tablet by mouth daily. 90 tablet 1  . Calcium Carbonate-Vitamin D (CALCIUM-VITAMIN D) 500-200 MG-UNIT tablet Take by mouth.    . Calcium Citrate 200 MG TABS Take by mouth.    . Cholecalciferol (D3-1000) 1000 UNITS tablet Take 1,000 Units by mouth daily.    Marland Kitchen. EPINEPHrine 0.3 mg/0.3 mL IJ SOAJ injection USE AS DIRECTED 2 Device 0  . loratadine (CLARITIN) 10 MG tablet Take 10 mg by mouth daily.    . Multiple Vitamin (MULTIVITAMIN) tablet Take by mouth.    . Triamcinolone Acetonide (NASACORT ALLERGY 24HR NA) Place 1 spray into  the nose daily.    Marland Kitchen. amLODipine (NORVASC) 5 MG tablet Take 1 tablet (5 mg total) by mouth daily. (Patient not taking: Reported on 03/01/2018) 30 tablet 5  . CADUET 5-10 MG tablet Take 1 tablet by mouth daily. (Patient not taking: Reported on 03/01/2018) 30 tablet 3   No facility-administered medications prior to visit.     Review of Systems;  Patient denies headache, fevers, malaise, unintentional weight loss, skin rash, eye pain, sinus congestion and sinus pain, sore throat, dysphagia,  hemoptysis , cough, dyspnea, wheezing, chest pain, palpitations, orthopnea, edema, abdominal pain, nausea, melena, diarrhea, constipation, flank pain, dysuria, hematuria, urinary  Frequency, nocturia, numbness, tingling, seizures,  Focal weakness, Loss of consciousness,  Tremor, insomnia, depression, anxiety, and suicidal ideation.      Objective:  BP 124/82 (BP Location: Left Arm, Patient Position: Sitting, Cuff Size: Normal)   Pulse 75   Temp 98.2 F (36.8 C) (Oral)   Resp 14   Ht 5\' 3"  (1.6 m)   Wt 145 lb 9.6 oz (66 kg)   SpO2 96%   BMI 25.79 kg/m   BP Readings from Last 3 Encounters:  03/01/18 124/82  11/02/17 118/86  06/17/17 117/74    Wt Readings from Last 3 Encounters:  03/01/18 145 lb 9.6 oz (66 kg)  11/02/17 144 lb 6.4 oz (65.5 kg)  06/17/17 138 lb 1.6 oz (62.6 kg)    General appearance: alert, cooperative and appears stated age  Ears: normal TM's and external ear canals both ears Throat: lips, mucosa, and tongue normal; teeth and gums normal Neck: no adenopathy, no carotid bruit, supple, symmetrical, trachea midline and thyroid not enlarged, symmetric, no tenderness/mass/nodules Back: symmetric, no curvature. ROM normal. No CVA tenderness. Lungs: clear to auscultation bilaterally Heart: regular rate and rhythm, S1, S2 normal, no murmur, click, rub or gallop Abdomen: soft, non-tender; bowel sounds normal; no masses,  no organomegaly Pulses: 2+ and symmetric Skin: Skin color,  texture, turgor normal. No rashes or lesions Lymph nodes: Cervical, supraclavicular, and axillary nodes normal.  No results found for: HGBA1C  Lab Results  Component Value Date   CREATININE 1.15 11/02/2017   CREATININE 0.87 05/14/2017   CREATININE 0.72 11/11/2016    Lab Results  Component Value Date   WBC 3.9 (L) 03/05/2016   HGB 12.7 03/05/2016   HCT 38.2 03/05/2016   PLT 320.0 03/05/2016   GLUCOSE 90 11/02/2017   CHOL 169 05/14/2017   TRIG 59.0 05/14/2017   HDL 58.00 05/14/2017   LDLDIRECT 90.0 03/05/2016   LDLCALC 99 05/14/2017   ALT 20 11/02/2017   AST 21 11/02/2017   NA 140 11/02/2017   K 3.9 11/02/2017   CL 102 11/02/2017   CREATININE 1.15 11/02/2017   BUN 14 11/02/2017   CO2 32 11/02/2017   TSH 0.69 09/07/2014   MICROALBUR 2.6 (H) 05/11/2017    No results found.  Assessment & Plan:   Problem List Items Addressed This Visit    Essential hypertension, benign    Well controlled on current regimen. Renal function stable, no changes today.  . 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  Her mother appears to the source of her conflict due to and she was requested psychology referral.       Breast nodule    Managed by Dr Donell Beers who has recommended MRI and 6 month follow up       Allergic conjunctivitis    patanol eye drops prescribed        Other Visit Diagnoses    Conflict between patient and family    -  Primary   Relevant Orders   Ambulatory referral to Psychology     A total of 25 minutes of face to face time was spent with patient more than half of which was spent in counselling about the above mentioned conditions  and coordination of care   I have discontinued Ashliegh M.  Ryce's CADUET and amLODipine. I am also having her start on olopatadine. Additionally, I am having her maintain her Triamcinolone Acetonide (NASACORT ALLERGY 24HR NA), Cholecalciferol, Calcium Citrate, calcium-vitamin D, multivitamin, EPINEPHrine, loratadine, and amLODipine-atorvastatin.  Meds ordered this encounter  Medications  . olopatadine (PATANOL) 0.1 % ophthalmic solution    Sig: Place 1 drop into both eyes 2 (two) times daily.    Dispense:  5 mL    Refill:  12    Medications Discontinued During This Encounter  Medication Reason  . CADUET 5-10 MG tablet Patient has not taken in last 30 days  . amLODipine (NORVASC) 5 MG tablet Change in therapy    Follow-up: Return  in about 6 months (around 09/01/2018) for CPE.   Sherlene Shams, MD

## 2018-03-02 DIAGNOSIS — H1010 Acute atopic conjunctivitis, unspecified eye: Secondary | ICD-10-CM | POA: Insufficient documentation

## 2018-03-02 NOTE — Assessment & Plan Note (Signed)
Managed by Dr Donell Beersbyerly who has recommended MRI and 6 month follow up

## 2018-03-02 NOTE — Assessment & Plan Note (Signed)
patanol eye drops prescribed

## 2018-03-02 NOTE — Assessment & Plan Note (Signed)
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  Her mother appears to the source of her conflict due to and she was requested psychology referral.

## 2018-03-02 NOTE — Assessment & Plan Note (Signed)
Well controlled on current regimen. Renal function stable, no changes today.  Lab Results  Component Value Date   CREATININE 1.15 11/02/2017   Lab Results  Component Value Date   NA 140 11/02/2017   K 3.9 11/02/2017   CL 102 11/02/2017   CO2 32 11/02/2017    

## 2018-03-17 ENCOUNTER — Other Ambulatory Visit: Payer: Self-pay | Admitting: Obstetrics and Gynecology

## 2018-03-17 ENCOUNTER — Inpatient Hospital Stay: Payer: BLUE CROSS/BLUE SHIELD | Attending: Obstetrics and Gynecology | Admitting: Obstetrics and Gynecology

## 2018-03-17 VITALS — BP 118/78 | HR 77 | Temp 97.5°F | Resp 18 | Ht 63.0 in | Wt 144.7 lb

## 2018-03-17 DIAGNOSIS — A630 Anogenital (venereal) warts: Secondary | ICD-10-CM | POA: Diagnosis not present

## 2018-03-17 DIAGNOSIS — R87612 Low grade squamous intraepithelial lesion on cytologic smear of cervix (LGSIL): Secondary | ICD-10-CM | POA: Diagnosis present

## 2018-03-17 NOTE — Progress Notes (Signed)
Gynecologic Oncology Visit   Referring Provider: Dr. Arvil ChacoVan Dalen  Chief Concern: abnormal Pap smears and VAIN1  Subjective:  Maria Kemp is a 62 y.o. female who has a long history of slightly abnormal Pap smears, mainly LGSIL, often negative biopsies after colposcopy. 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   Returns today for surveillance exam.  Last seen LSIL PAP/HPV Positive 06/17/2017.    Today, she denies complaints, specifically denying vaginal bleeding, discharge, and/or pain.   Gynecologic History:  Dr Arvil ChacoVan Dalen performed LEEP in 1999 and CKC in 2006 for cervical dysplasia.  Subsequent Pap smears vary between normal and LGSIL with high risk HPV.  Since 2013 her q6 month PAPs have shown ASCUS, Normal, LSIL, LSIL.  No HPV testing or colposcopy was done during that time period.  10/2014 she saw Dr. Johnnette LitterBerchuck. Colposcopy was performed. The transformation zone was not visible, but there were no lesions. Pap NILM and HR-HPV negative.   She was seen by Dr Johnnette LitterBerchuck 12/16 for colposcopy given LSIL cervical PAP, Vaginal Pap: ASCUS, negative HR HPV on 11/16. Biopsy of small cyst in the vagina just below the cervix was performed.  DIAGNOSIS:  A. VAGINA; BIOPSY:  - INFLAMED VAGINAL MUCOSA WITH A LOW GRADE SQUAMOUS INTRAEPITHELIAL  LESION (VAIN 1) AND PARTIAL BENIGN CYST.   PAP/HPV normal 12/17.   06/17/17- LSIL PAP/HPV Positive   Problem List: Patient Active Problem List   Diagnosis Date Noted  . Allergic conjunctivitis 03/02/2018  . Caregiver with fatigue 11/04/2017  . Breast nodule 03/22/2017  . Vaginal dysplasia 05/21/2016  . Low grade squamous intraepithelial lesion on cytologic smear of cervix (LGSIL) 05/21/2016  . History of colonic polyps 03/17/2016  . H/O sebaceous cyst 09/06/2015  . Hyperlipidemia LDL goal <130 09/06/2015  . Pruritus 03/05/2015  . Lichen planus 09/04/2014  . Essential hypertension, benign 09/04/2014  . Family history of colonic polyps 09/04/2014  . H/O  rotator cuff tear 09/04/2014   Past Medical History: Past Medical History:  Diagnosis Date  . Colon polyps   . Hyperlipidemia   . Hypertension    Past Surgical History: Past Surgical History:  Procedure Laterality Date  . ABDOMINOPLASTY  1995  . CESAREAN SECTION  1989   Past Gynecologic History:  Menarche: 11 Menstrual details: postmenopausal  OB History: G3P2  Family History: Family History  Problem Relation Age of Onset  . Arthritis Mother   . Hyperlipidemia Mother   . Hypertension Mother   . Heart disease Mother   . Arthritis Father   . Cancer Father   . Hyperlipidemia Father   . Hypertension Father   . Heart disease Father   . Diabetes Father   . Hyperlipidemia Brother   . Hypertension Brother    Social History: Social History   Socioeconomic History  . Marital status: Divorced    Spouse name: Not on file  . Number of children: Not on file  . Years of education: Not on file  . Highest education level: Not on file  Occupational History  . Not on file  Social Needs  . Financial resource strain: Not on file  . Food insecurity:    Worry: Not on file    Inability: Not on file  . Transportation needs:    Medical: Not on file    Non-medical: Not on file  Tobacco Use  . Smoking status: Never Smoker  . Smokeless tobacco: Never Used  Substance and Sexual Activity  . Alcohol use: Yes  Comment: rarely  . Drug use: No  . Sexual activity: Not on file  Lifestyle  . Physical activity:    Days per week: Not on file    Minutes per session: Not on file  . Stress: Not on file  Relationships  . Social connections:    Talks on phone: Not on file    Gets together: Not on file    Attends religious service: Not on file    Active member of club or organization: Not on file    Attends meetings of clubs or organizations: Not on file    Relationship status: Not on file  . Intimate partner violence:    Fear of current or ex partner: Not on file    Emotionally  abused: Not on file    Physically abused: Not on file    Forced sexual activity: Not on file  Other Topics Concern  . Not on file  Social History Narrative  . Not on file   Allergies: Allergies  Allergen Reactions  . Codeine Anaphylaxis  . Shrimp [Shellfish Allergy] Anaphylaxis    All shell fish   Current Medications: Current Outpatient Medications  Medication Sig Dispense Refill  . amLODipine-atorvastatin (CADUET) 5-10 MG tablet Take 1 tablet by mouth daily. 90 tablet 1  . Biotin (BIOTIN 5000) 5 MG CAPS Take 1 capsule by mouth daily.    . Cholecalciferol (D3-1000) 1000 UNITS tablet Take 1,000 Units by mouth daily.    Marland Kitchen EPINEPHrine 0.3 mg/0.3 mL IJ SOAJ injection USE AS DIRECTED 2 Device 0  . loratadine (CLARITIN) 10 MG tablet Take 10 mg by mouth daily.    . Misc. Devices (ALL-BODY MASSAGE) MISC by Does not apply route.    . Multiple Vitamin (MULTIVITAMIN) tablet Take by mouth.    . Triamcinolone Acetonide (NASACORT ALLERGY 24HR NA) Place 1 spray into the nose daily.    . Calcium Carbonate-Vitamin D (CALCIUM-VITAMIN D) 500-200 MG-UNIT tablet Take 1 tablet by mouth daily.     Marland Kitchen olopatadine (PATANOL) 0.1 % ophthalmic solution Place 1 drop into both eyes 2 (two) times daily. 5 mL 12   No current facility-administered medications for this visit.     Review of Systems General: no complaints  HEENT: no complaints  Lungs: no complaints  Cardiac: no complaints  GI: no complaints  GU: no complaints  Musculoskeletal: no complaints  Extremities: no complaints  Skin: no complaints  Neuro: no complaints  Endocrine: no complaints  Psych: no complaints     Objective:  Physical Examination:  BP 118/78   Pulse 77   Temp (!) 97.5 F (36.4 C) (Tympanic)   Resp 18   Ht 5\' 3"  (1.6 m)   Wt 144 lb 11.2 oz (65.6 kg)   BMI 25.63 kg/m    ECOG Performance Status: 0 - Asymptomatic  General appearance: alert, cooperative and appears stated age HEENT:PERRLA, extra ocular movement  intact and sclera clear, anicteric Abdomen: soft. No masses Lymph node survey: non-palpable, inguinal Extremities: extremities normal, atraumatic, no cyanosis or edema Neurological exam reveals alert, oriented, normal speech, no focal findings or movement disorder noted.  Pelvic: exam chaperoned by nurse;  Vulva: normal appearing vulva with no masses, tenderness or lesions; Vagina: normal vagina. Cervix: flush with vagina, normal to inspection and palpation. Os stenotic. Adnexa: normal adnexa in size, nontender and no masses; Uterus: uterus is normal size, shape, consistency and non-tender.  Patient signed consent and colposcopy done with acetic acid. No lesions seen in upper vagina or  cervix.  Atrophic.  Assessment:  Maria Kemp is a 62 y.o. 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 Cervical os stenosis. LSIL Pap smear with positive HPV 06/17/2017. Normal exam today. Pap Co-Testing Done today. Colposcopy performed today without significant findings.   Plan:   Problem List Items Addressed This Visit      Other   Low grade squamous intraepithelial lesion on cytologic smear of cervix (LGSIL) - Primary   Relevant Orders   Pap liquid-based and HPV (high risk)     PAP/HPV and colposcopy done today.  I recommended continued surveillance with close follow up in 8 months.  Consuello Masse, DNP, AGNP-C [facility] [phone] (work cell) [phone] (office) 03/17/18 5:50 PM   CC:  PCP Sherlene Shams, MD [address] [place], [place] [address] [phone]  I personally interviewed and examined the patient. Agreed with the above/below plan of care. Patient/family questions were answered.  Leida Lauth, MD

## 2018-03-17 NOTE — Progress Notes (Signed)
Pt expresses no gyn concerns today

## 2018-03-18 ENCOUNTER — Encounter: Payer: Self-pay | Admitting: Obstetrics and Gynecology

## 2018-03-23 LAB — PAP LB AND HPV HIGH-RISK
HPV, high-risk: NEGATIVE
PAP SMEAR COMMENT: 0

## 2018-03-30 ENCOUNTER — Other Ambulatory Visit: Payer: Self-pay | Admitting: General Surgery

## 2018-03-30 ENCOUNTER — Telehealth: Payer: Self-pay | Admitting: Nurse Practitioner

## 2018-03-30 DIAGNOSIS — R928 Other abnormal and inconclusive findings on diagnostic imaging of breast: Secondary | ICD-10-CM

## 2018-03-30 NOTE — Telephone Encounter (Signed)
Called patient with results of Pap and HPV. Appointment scheduled for 11/2018. Patient requests to see Dr. Johnnette LitterBerchuck if possible.

## 2018-04-20 ENCOUNTER — Ambulatory Visit: Payer: BLUE CROSS/BLUE SHIELD | Admitting: Psychology

## 2018-06-18 ENCOUNTER — Ambulatory Visit: Payer: BLUE CROSS/BLUE SHIELD | Admitting: Psychology

## 2018-07-09 ENCOUNTER — Ambulatory Visit (INDEPENDENT_AMBULATORY_CARE_PROVIDER_SITE_OTHER): Payer: BLUE CROSS/BLUE SHIELD | Admitting: Psychology

## 2018-07-09 DIAGNOSIS — F4323 Adjustment disorder with mixed anxiety and depressed mood: Secondary | ICD-10-CM | POA: Diagnosis not present

## 2018-09-06 ENCOUNTER — Ambulatory Visit: Payer: BLUE CROSS/BLUE SHIELD | Admitting: Internal Medicine

## 2018-09-06 DIAGNOSIS — Z0289 Encounter for other administrative examinations: Secondary | ICD-10-CM

## 2018-09-10 ENCOUNTER — Ambulatory Visit: Payer: BLUE CROSS/BLUE SHIELD | Admitting: Psychology

## 2018-10-11 ENCOUNTER — Ambulatory Visit: Payer: BLUE CROSS/BLUE SHIELD | Admitting: Internal Medicine

## 2018-10-11 ENCOUNTER — Encounter: Payer: Self-pay | Admitting: Internal Medicine

## 2018-10-11 VITALS — BP 118/72 | HR 69 | Temp 98.2°F | Resp 14 | Ht 63.0 in | Wt 144.8 lb

## 2018-10-11 DIAGNOSIS — H25013 Cortical age-related cataract, bilateral: Secondary | ICD-10-CM | POA: Diagnosis not present

## 2018-10-11 DIAGNOSIS — E785 Hyperlipidemia, unspecified: Secondary | ICD-10-CM

## 2018-10-11 DIAGNOSIS — M25841 Other specified joint disorders, right hand: Secondary | ICD-10-CM

## 2018-10-11 DIAGNOSIS — I10 Essential (primary) hypertension: Secondary | ICD-10-CM

## 2018-10-11 DIAGNOSIS — Z01810 Encounter for preprocedural cardiovascular examination: Secondary | ICD-10-CM | POA: Diagnosis not present

## 2018-10-11 MED ORDER — EPINEPHRINE 0.3 MG/0.3ML IJ SOAJ: INTRAMUSCULAR | 0 refills | Status: DC

## 2018-10-11 NOTE — Progress Notes (Signed)
Subjective:  Patient ID: Maria Kemp, female    DOB: 1956-05-29  Age: 62 y.o. MRN: [id number]  CC: The primary encounter diagnosis was Preoperative cardiovascular examination. Diagnoses of Hyperlipidemia LDL goal <130, Essential hypertension, benign, Cortical age-related cataract of both eyes, and Cyst of joint of hand, right were also pertinent to this visit.  HPI [name] presents for follow up on hypertension and hyperlipidemia,  As well as  preoperative evaluation.  She is having cataract surgery dec 18 and jan 8 by Dr Alford Highland at [facility]  With laser.  Local anesthesia .    She has no history of CAD , and  has had no recent episodes of chest pain   She ha no history of diabetes or CKD ype 2 which has been historically well controlled  and not complicated by nephropathy , neuropathy , or PAD.  She is due for follow up labs.   Has developed a Cyst on lateral aspect of right thumb near the base of thumb. Has been enlarging  Since July/August   Discussed  Referral to hand surgeon  At [facility]   She is curious about changing statin therapy to one that is more bioavailable to Asians. .    Lab Results  Component Value Date   TSH 0.88 10/11/2018     Outpatient Medications Prior to Visit  Medication Sig Dispense Refill  . amLODipine-atorvastatin (CADUET) 5-10 MG tablet Take 1 tablet by mouth daily. 90 tablet 1  . Biotin (BIOTIN 5000) 5 MG CAPS Take 1 capsule by mouth daily.    . Calcium Carbonate-Vitamin D (CALCIUM-VITAMIN D) 500-200 MG-UNIT tablet Take 1 tablet by mouth daily.     . Cholecalciferol (D3-1000) 1000 UNITS tablet Take 1,000 Units by mouth daily.    Marland Kitchen loratadine (CLARITIN) 10 MG tablet Take 10 mg by mouth daily.    . Misc. Devices (ALL-BODY MASSAGE) MISC by Does not apply route.    . Multiple Vitamin (MULTIVITAMIN) tablet Take by mouth.    . Triamcinolone Acetonide (NASACORT ALLERGY 24HR NA) Place 1 spray into the nose daily.    Marland Kitchen EPINEPHrine 0.3 mg/0.3  mL IJ SOAJ injection USE AS DIRECTED 2 Device 0  . olopatadine (PATANOL) 0.1 % ophthalmic solution Place 1 drop into both eyes 2 (two) times daily. (Patient not taking: Reported on 10/11/2018) 5 mL 12   No facility-administered medications prior to visit.     Review of Systems;  Patient denies headache, fevers, malaise, unintentional weight loss, skin rash, eye pain, sinus congestion and sinus pain, sore throat, dysphagia,  hemoptysis , cough, dyspnea, wheezing, chest pain, palpitations, orthopnea, edema, abdominal pain, nausea, melena, diarrhea, constipation, flank pain, dysuria, hematuria, urinary  Frequency, nocturia, numbness, tingling, seizures,  Focal weakness, Loss of consciousness,  Tremor, insomnia, depression, anxiety, and suicidal ideation.      Objective:  BP 118/72 (BP Location: Left Arm, Patient Position: Sitting, Cuff Size: Normal)   Pulse 69   Temp 98.2 F (36.8 C) (Oral)   Resp 14   Ht 5\' 3"  (1.6 m)   Wt 144 lb 12.8 oz (65.7 kg)   SpO2 97%   BMI 25.65 kg/m   BP Readings from Last 3 Encounters:  10/11/18 118/72  03/17/18 118/78  03/01/18 124/82    Wt Readings from Last 3 Encounters:  10/11/18 144 lb 12.8 oz (65.7 kg)  03/17/18 144 lb 11.2 oz (65.6 kg)  03/01/18 145 lb 9.6 oz (66 kg)    General appearance: alert,  cooperative and appears stated age Ears: normal TM's and external ear canals both ears Throat: lips, mucosa, and tongue normal; teeth and gums normal Neck: no adenopathy, no carotid bruit, supple, symmetrical, trachea midline and thyroid not enlarged, symmetric, no tenderness/mass/nodules Back: symmetric, no curvature. ROM normal. No CVA tenderness. Lungs: clear to auscultation bilaterally Heart: regular rate and rhythm, S1, S2 normal, no murmur, click, rub or gallop Abdomen: soft, non-tender; bowel sounds normal; no masses,  no organomegaly Pulses: 2+ and symmetric Skin: Skin color, texture, turgor normal. No rashes or lesions Lymph nodes:  Cervical, supraclavicular, and axillary nodes normal.  No results found for: HGBA1C  Lab Results  Component Value Date   CREATININE 0.96 10/11/2018   CREATININE 1.15 11/02/2017   CREATININE 0.87 05/14/2017    Lab Results  Component Value Date   WBC 3.9 (L) 03/05/2016   HGB 12.7 03/05/2016   HCT 38.2 03/05/2016   PLT 320.0 03/05/2016   GLUCOSE 108 (H) 10/11/2018   CHOL 169 05/14/2017   TRIG 59.0 05/14/2017   HDL 58.00 05/14/2017   LDLDIRECT 90.0 03/05/2016   LDLCALC 99 05/14/2017   ALT 17 10/11/2018   AST 19 10/11/2018   NA 139 10/11/2018   K 4.1 10/11/2018   CL 102 10/11/2018   CREATININE 0.96 10/11/2018   BUN 18 10/11/2018   CO2 28 10/11/2018   TSH 0.88 10/11/2018   MICROALBUR 2.6 (H) 05/11/2017    No results found.  Assessment & Plan:   Problem List Items Addressed This Visit    Bilateral cataracts    Patient  is considered to be at low risk  For perioperative complications  Based on today's exam and history.  Baseline lytes, have been done  Lab Results  Component Value Date   CREATININE 0.96 10/11/2018        Cyst of joint of hand, right    Referring to hand surgeon fore removal, as it may be attached to thumb tendon .       Essential hypertension, benign    Well controlled on current regimen. Renal function stable, no changes today.  Lab Results  Component Value Date   CREATININE 0.96 10/11/2018   Lab Results  Component Value Date   NA 139 10/11/2018   K 4.1 10/11/2018   CL 102 10/11/2018   CO2 28 10/11/2018         Relevant Medications   EPINEPHrine 0.3 mg/0.3 mL IJ SOAJ injection   Hyperlipidemia LDL goal <130    Managed with 10 mg atorvastatin  (Caduet) . She has no side effects and will return for fasting labs and liver enzymes . No changes today. Nonfasting LDl is 90  Lab Results  Component Value Date   CHOL 169 05/14/2017   HDL 58.00 05/14/2017   LDLCALC 99 05/14/2017   LDLDIRECT 90.0 03/05/2016   TRIG 59.0 05/14/2017    CHOLHDL 3 05/14/2017         Relevant Medications   EPINEPHrine 0.3 mg/0.3 mL IJ SOAJ injection    Other Visit Diagnoses    Preoperative cardiovascular examination    -  Primary   Relevant Orders   Comprehensive metabolic panel (Completed)   TSH (Completed)    A total of 25 minutes of face to face time was spent with patient more than half of which was spent in counselling about the above mentioned conditions  and coordination of care   I have discontinued Helayne M. Nyland's olopatadine. I am also having her maintain her  Triamcinolone Acetonide (NASACORT ALLERGY 24HR NA), Cholecalciferol, calcium-vitamin D, multivitamin, loratadine, amLODipine-atorvastatin, Biotin, ALL-BODY MASSAGE, and EPINEPHrine.  Meds ordered this encounter  Medications  . EPINEPHrine 0.3 mg/0.3 mL IJ SOAJ injection    Sig: USE AS DIRECTED    Dispense:  2 Device    Refill:  0    Medications Discontinued During This Encounter  Medication Reason  . olopatadine (PATANOL) 0.1 % ophthalmic solution Error  . EPINEPHrine 0.3 mg/0.3 mL IJ SOAJ injection Reorder    Follow-up: Return in about 6 months (around 04/11/2019).   Sherlene Shams, MD

## 2018-10-11 NOTE — Patient Instructions (Addendum)
Referral to hand surgeon  In progress to remove the cyst on your right thumb

## 2018-10-12 DIAGNOSIS — M25841 Other specified joint disorders, right hand: Secondary | ICD-10-CM | POA: Insufficient documentation

## 2018-10-12 DIAGNOSIS — H269 Unspecified cataract: Secondary | ICD-10-CM | POA: Insufficient documentation

## 2018-10-12 LAB — TSH: TSH: 0.88 u[IU]/mL (ref 0.35–4.50)

## 2018-10-12 LAB — COMPREHENSIVE METABOLIC PANEL
ALBUMIN: 4.4 g/dL (ref 3.5–5.2)
ALT: 17 U/L (ref 0–35)
AST: 19 U/L (ref 0–37)
Alkaline Phosphatase: 77 U/L (ref 39–117)
BILIRUBIN TOTAL: 0.3 mg/dL (ref 0.2–1.2)
BUN: 18 mg/dL (ref 6–23)
CALCIUM: 9.6 mg/dL (ref 8.4–10.5)
CHLORIDE: 102 meq/L (ref 96–112)
CO2: 28 meq/L (ref 19–32)
CREATININE: 0.96 mg/dL (ref 0.40–1.20)
GFR: 75.45 mL/min (ref >60.00)
Glucose, Bld: 108 mg/dL — ABNORMAL HIGH (ref 70–99)
Potassium: 4.1 mEq/L (ref 3.5–5.1)
Sodium: 139 mEq/L (ref 135–145)
Total Protein: 7.3 g/dL (ref 6.0–8.3)

## 2018-10-12 NOTE — Assessment & Plan Note (Signed)
Patient  is considered to be at low risk  For perioperative complications  Based on today's exam and history.  Baseline lytes, have been done  Lab Results  Component Value Date   CREATININE 0.96 10/11/2018

## 2018-10-12 NOTE — Assessment & Plan Note (Signed)
Well controlled on current regimen. Renal function stable, no changes today.  Lab Results  Component Value Date   CREATININE 0.96 10/11/2018   Lab Results  Component Value Date   NA 139 10/11/2018   K 4.1 10/11/2018   CL 102 10/11/2018   CO2 28 10/11/2018

## 2018-10-12 NOTE — Assessment & Plan Note (Signed)
Referring to hand surgeon fore removal, as it may be attached to thumb tendon .

## 2018-10-12 NOTE — Assessment & Plan Note (Signed)
Managed with 10 mg atorvastatin  (Caduet) . She has no side effects and will return for fasting labs and liver enzymes . No changes today. Nonfasting LDl is 90  Lab Results  Component Value Date   CHOL 169 05/14/2017   HDL 58.00 05/14/2017   LDLCALC 99 05/14/2017   LDLDIRECT 90.0 03/05/2016   TRIG 59.0 05/14/2017   CHOLHDL 3 05/14/2017

## 2018-10-26 ENCOUNTER — Other Ambulatory Visit: Payer: Self-pay | Admitting: Internal Medicine

## 2018-10-26 DIAGNOSIS — Z8371 Family history of colonic polyps: Secondary | ICD-10-CM

## 2018-10-27 ENCOUNTER — Other Ambulatory Visit: Payer: Self-pay

## 2018-10-27 DIAGNOSIS — Z83719 Family history of colon polyps, unspecified: Secondary | ICD-10-CM

## 2018-10-27 DIAGNOSIS — Z8371 Family history of colonic polyps: Secondary | ICD-10-CM

## 2018-11-15 ENCOUNTER — Other Ambulatory Visit: Payer: Self-pay | Admitting: Internal Medicine

## 2018-11-15 NOTE — Telephone Encounter (Signed)
Copied from CRM [id number]. Topic: Quick Communication - Rx Refill/Question >> Nov 15, 2018 11:07 AM Burchel, Abbi R wrote: Medication: amLODipine-atorvastatin (CADUET) 5-10 MG tablet  Please note, pt is out of her medication.   Preferred Pharmacy : [facility] [place] - [address] [place] [place] [id number] Phone: [phone] Fax: [phone]   Pt was advised that RX refills may take up to 3 business days. We ask that you follow-up with your pharmacy.

## 2018-11-17 ENCOUNTER — Ambulatory Visit: Payer: Self-pay

## 2018-11-17 MED ORDER — AMLODIPINE-ATORVASTATIN 5-10 MG PO TABS: 1.0000 | ORAL_TABLET | Freq: Every day | ORAL | 1 refills | Status: DC

## 2018-11-22 ENCOUNTER — Other Ambulatory Visit: Payer: Self-pay

## 2018-11-22 ENCOUNTER — Encounter: Payer: Self-pay | Admitting: *Deleted

## 2018-11-23 ENCOUNTER — Other Ambulatory Visit: Payer: Self-pay

## 2018-11-23 DIAGNOSIS — Z83719 Family history of colon polyps, unspecified: Secondary | ICD-10-CM

## 2018-11-23 DIAGNOSIS — Z8371 Family history of colonic polyps: Secondary | ICD-10-CM

## 2018-11-26 NOTE — Discharge Instructions (Signed)
General Anesthesia, Adult, Care After  This sheet gives you information about how to care for yourself after your procedure. Your health care provider may also give you more specific instructions. If you have problems or questions, contact your health care provider.  What can I expect after the procedure?  After the procedure, the following side effects are common:  Pain or discomfort at the IV site.  Nausea.  Vomiting.  Sore throat.  Trouble concentrating.  Feeling cold or chills.  Weak or tired.  Sleepiness and fatigue.  Soreness and body aches. These side effects can affect parts of the body that were not involved in surgery.  Follow these instructions at home:    For at least 24 hours after the procedure:  Have a responsible adult stay with you. It is important to have someone help care for you until you are awake and alert.  Rest as needed.  Do not:  Participate in activities in which you could fall or become injured.  Drive.  Use heavy machinery.  Drink alcohol.  Take sleeping pills or medicines that cause drowsiness.  Make important decisions or sign legal documents.  Take care of children on your own.  Eating and drinking  Follow any instructions from your health care provider about eating or drinking restrictions.  When you feel hungry, start by eating small amounts of foods that are soft and easy to digest (bland), such as toast. Gradually return to your regular diet.  Drink enough fluid to keep your urine pale yellow.  If you vomit, rehydrate by drinking water, juice, or clear broth.  General instructions  If you have sleep apnea, surgery and certain medicines can increase your risk for breathing problems. Follow instructions from your health care provider about wearing your sleep device:  Anytime you are sleeping, including during daytime naps.  While taking prescription pain medicines, sleeping medicines, or medicines that make you drowsy.  Return to your normal activities as told by your health care  provider. Ask your health care provider what activities are safe for you.  Take over-the-counter and prescription medicines only as told by your health care provider.  If you smoke, do not smoke without supervision.  Keep all follow-up visits as told by your health care provider. This is important.  Contact a health care provider if:  You have nausea or vomiting that does not get better with medicine.  You cannot eat or drink without vomiting.  You have pain that does not get better with medicine.  You are unable to pass urine.  You develop a skin rash.  You have a fever.  You have redness around your IV site that gets worse.  Get help right away if:  You have difficulty breathing.  You have chest pain.  You have blood in your urine or stool, or you vomit blood.  Summary  After the procedure, it is common to have a sore throat or nausea. It is also common to feel tired.  Have a responsible adult stay with you for the first 24 hours after general anesthesia. It is important to have someone help care for you until you are awake and alert.  When you feel hungry, start by eating small amounts of foods that are soft and easy to digest (bland), such as toast. Gradually return to your regular diet.  Drink enough fluid to keep your urine pale yellow.  Return to your normal activities as told by your health care provider. Ask your health care   provider what activities are safe for you.  This information is not intended to replace advice given to you by your health care provider. Make sure you discuss any questions you have with your health care provider.  Document Released: 03/02/2001 Document Revised: 07/10/2017 Document Reviewed: 07/10/2017  Elsevier Interactive Patient Education  2019 [employer]

## 2018-11-29 ENCOUNTER — Ambulatory Visit: Payer: BLUE CROSS/BLUE SHIELD | Admitting: Anesthesiology

## 2018-11-29 ENCOUNTER — Encounter
Admission: RE | Disposition: A | Discharge status: Home / Self Care | Payer: Self-pay | Source: Home / Self Care | Attending: Gastroenterology

## 2018-11-29 ENCOUNTER — Ambulatory Visit
Admission: RE | Admit: 2018-11-29 | Discharge: 2018-11-29 | Disposition: A | Discharge status: Home / Self Care | Payer: BLUE CROSS/BLUE SHIELD | Attending: Gastroenterology | Admitting: Gastroenterology

## 2018-11-29 DIAGNOSIS — K5730 Diverticulosis of large intestine without perforation or abscess without bleeding: Secondary | ICD-10-CM | POA: Insufficient documentation

## 2018-11-29 DIAGNOSIS — Z79899 Other long term (current) drug therapy: Secondary | ICD-10-CM | POA: Insufficient documentation

## 2018-11-29 DIAGNOSIS — Z86010 Personal history of colonic polyps: Secondary | ICD-10-CM | POA: Diagnosis not present

## 2018-11-29 DIAGNOSIS — I10 Essential (primary) hypertension: Secondary | ICD-10-CM | POA: Insufficient documentation

## 2018-11-29 DIAGNOSIS — Z1211 Encounter for screening for malignant neoplasm of colon: Secondary | ICD-10-CM | POA: Insufficient documentation

## 2018-11-29 DIAGNOSIS — K640 First degree hemorrhoids: Secondary | ICD-10-CM | POA: Diagnosis not present

## 2018-11-29 DIAGNOSIS — Z8371 Family history of colonic polyps: Secondary | ICD-10-CM | POA: Insufficient documentation

## 2018-11-29 DIAGNOSIS — K635 Polyp of colon: Secondary | ICD-10-CM | POA: Diagnosis not present

## 2018-11-29 HISTORY — DX: Nausea with vomiting, unspecified: R11.2

## 2018-11-29 HISTORY — PX: POLYPECTOMY: SHX5525

## 2018-11-29 HISTORY — PX: COLONOSCOPY WITH PROPOFOL: SHX5780

## 2018-11-29 HISTORY — DX: Unspecified osteoarthritis, unspecified site: M19.90

## 2018-11-29 HISTORY — DX: Nausea with vomiting, unspecified: Z98.890

## 2018-11-29 SURGERY — COLONOSCOPY WITH PROPOFOL
Anesthesia: General | Site: Rectum

## 2018-11-29 MED ORDER — SODIUM CHLORIDE 0.9 % IV SOLN: INTRAVENOUS | Status: DC

## 2018-11-29 MED ORDER — LACTATED RINGERS IV SOLN
INTRAVENOUS | Status: DC
  Administered 2018-11-29: 07:00:00 via INTRAVENOUS

## 2018-11-29 MED ORDER — PROPOFOL 10 MG/ML IV BOLUS
INTRAVENOUS | Status: DC | PRN
  Administered 2018-11-29: 70 mg via INTRAVENOUS
  Administered 2018-11-29: 20 mg via INTRAVENOUS
  Administered 2018-11-29: 30 mg via INTRAVENOUS
  Administered 2018-11-29 (×4): 20 mg via INTRAVENOUS

## 2018-11-29 MED ORDER — LIDOCAINE HCL (CARDIAC) PF 100 MG/5ML IV SOSY
PREFILLED_SYRINGE | INTRAVENOUS | Status: DC | PRN
  Administered 2018-11-29: 40 mg via INTRAVENOUS

## 2018-11-29 MED ORDER — STERILE WATER FOR IRRIGATION IR SOLN
Status: DC | PRN
  Administered 2018-11-29: 08:00:00

## 2018-11-29 SURGICAL SUPPLY — 24 items
CANISTER SUCT 1200ML W/VALVE (MISCELLANEOUS) ×3 IMPLANT
CLIP HMST 235XBRD CATH ROT (MISCELLANEOUS) IMPLANT
CLIP RESOLUTION 360 11X235 (MISCELLANEOUS)
ELECT REM PT RETURN 9FT ADLT (ELECTROSURGICAL)
ELECTRODE REM PT RTRN 9FT ADLT (ELECTROSURGICAL) IMPLANT
FCP ESCP3.2XJMB 240X2.8X (MISCELLANEOUS)
FORCEPS BIOP RAD 4 LRG CAP 4 (CUTTING FORCEPS) ×1 IMPLANT
FORCEPS BIOP RJ4 240 W/NDL (MISCELLANEOUS)
FORCEPS ESCP3.2XJMB 240X2.8X (MISCELLANEOUS) IMPLANT
GOWN CVR UNV OPN BCK APRN NK (MISCELLANEOUS) ×4 IMPLANT
GOWN ISOL THUMB LOOP REG UNIV (MISCELLANEOUS) ×6
INJECTOR VARIJECT VIN23 (MISCELLANEOUS) IMPLANT
KIT DEFENDO VALVE AND CONN (KITS) IMPLANT
KIT ENDO PROCEDURE OLY (KITS) ×3 IMPLANT
MARKER SPOT ENDO TATTOO 5ML (MISCELLANEOUS) IMPLANT
PROBE APC STR FIRE (PROBE) IMPLANT
RETRIEVER NET ROTH 2.5X230 LF (MISCELLANEOUS) IMPLANT
SNARE SHORT THROW 13M SML OVAL (MISCELLANEOUS) IMPLANT
SNARE SHORT THROW 30M LRG OVAL (MISCELLANEOUS) IMPLANT
SNARE SNG USE RND 15MM (INSTRUMENTS) IMPLANT
SPOT EX ENDOSCOPIC TATTOO (MISCELLANEOUS)
TRAP ETRAP POLY (MISCELLANEOUS) IMPLANT
VARIJECT INJECTOR VIN23 (MISCELLANEOUS)
WATER STERILE IRR 250ML POUR (IV SOLUTION) ×3 IMPLANT

## 2018-11-29 NOTE — Anesthesia Postprocedure Evaluation (Signed)
Anesthesia Post Note  Patient: Maria KocherRoslyn M Kemp  Procedure(s) Performed: COLONOSCOPY WITH PROPOFOL (N/A Rectum) POLYPECTOMY (N/A )  Patient location during evaluation: PACU Anesthesia Type: General Level of consciousness: awake and alert Pain management: pain level controlled Vital Signs Assessment: post-procedure vital signs reviewed and stable Respiratory status: spontaneous breathing, nonlabored ventilation, respiratory function stable and patient connected to nasal cannula oxygen Cardiovascular status: blood pressure returned to baseline and stable Postop Assessment: no apparent nausea or vomiting Anesthetic complications: no    Jakolby Sedivy

## 2018-11-29 NOTE — Op Note (Signed)
[facility] Gastroenterology Patient Name: Maria CornRoslyn Kemp Procedure Date: 11/29/2018 7:27 AM MRN: [id number] Account #: [id number] Date of Birth: Sep 03, 1956 Admit Type: Outpatient Age: [age] [facility] Gender: Female Note Status: Finalized Procedure:            Colonoscopy Indications:          Family history of advanced adenoma of the colon in a                        first-degree relative Providers:            [name] Avereigh Spainhower MD, MD Referring MD:         Duncan Dulleresa Tullo, MD (Referring MD) Medicines:            Propofol per Anesthesia Complications:        No immediate complications. Procedure:            Pre-Anesthesia Assessment:                       - Prior to the procedure, a History and Physical was                        performed, and patient medications and allergies were                        reviewed. The patient's tolerance of previous                        anesthesia was also reviewed. The risks and benefits of                        the procedure and the sedation options and risks were                        discussed with the patient. All questions were                        answered, and informed consent was obtained. Prior                        Anticoagulants: The patient has taken no previous                        anticoagulant or antiplatelet agents. ASA Grade                        Assessment: II - A patient with mild systemic disease.                        After reviewing the risks and benefits, the patient was                        deemed in satisfactory condition to undergo the                        procedure.                       After obtaining informed consent, the colonoscope was  passed under direct vision. Throughout the procedure,                        the patient's blood pressure, pulse, and oxygen                        saturations were monitored continuously. The                        Colonoscope  was introduced through the anus and                        advanced to the the cecum, identified by appendiceal                        orifice and ileocecal valve. The colonoscopy was                        performed without difficulty. The patient tolerated the                        procedure well. The quality of the bowel preparation                        was excellent. Findings:      The perianal and digital rectal examinations were normal.      A 3 mm polyp was found in the descending colon. The polyp was sessile.       The polyp was removed with a cold biopsy forceps. Resection and       retrieval were complete.      A few small-mouthed diverticula were found in the entire colon.      Non-bleeding internal hemorrhoids were found during retroflexion. The       hemorrhoids were Grade I (internal hemorrhoids that do not prolapse). Impression:           - One 3 mm polyp in the descending colon, removed with                        a cold biopsy forceps. Resected and retrieved.                       - Diverticulosis in the entire examined colon.                       - Non-bleeding internal hemorrhoids. Recommendation:       - Discharge patient to home.                       - Resume previous diet.                       - Continue present medications.                       - Await pathology results.                       - Repeat colonoscopy in 5 years for surveillance. Procedure Code(s):    --- Professional ---  16109, Colonoscopy, flexible; with biopsy, single or                        multiple Diagnosis Code(s):    --- Professional ---                       Z83.71, Family history of colonic polyps                       D12.4, Benign neoplasm of descending colon CPT copyright 2018 American Medical Association. All rights reserved. The codes documented in this report are preliminary and upon coder review may  be revised to meet current compliance  requirements. [name] MD, MD 11/29/2018 7:59:40 AM This report has been signed electronically. Number of Addenda: 0 Note Initiated On: 11/29/2018 7:27 AM Scope Withdrawal Time: 0 hours 8 minutes 53 seconds  Total Procedure Duration: 0 hours 13 minutes 27 seconds       [facility]

## 2018-11-29 NOTE — Anesthesia Preprocedure Evaluation (Signed)
Anesthesia Evaluation  Patient identified by MRN, date of birth, ID band  Reviewed: NPO status   History of Anesthesia Complications Negative for: history of anesthetic complications  Airway Mallampati: II  TM Distance: >3 FB Neck ROM: full    Dental no notable dental hx.    Pulmonary neg pulmonary ROS,    Pulmonary exam normal        Cardiovascular Exercise Tolerance: Good hypertension, Normal cardiovascular exam     Neuro/Psych negative neurological ROS  negative psych ROS   GI/Hepatic negative GI ROS, Neg liver ROS,   Endo/Other  negative endocrine ROS  Renal/GU negative Renal ROS  negative genitourinary   Musculoskeletal  (+) Arthritis ,   Abdominal   Peds  Hematology negative hematology ROS (+)   Anesthesia Other Findings   Reproductive/Obstetrics                             Anesthesia Physical Anesthesia Plan  ASA: II  Anesthesia Plan: General   Post-op Pain Management:    Induction:   PONV Risk Score and Plan:   Airway Management Planned: Natural Airway  Additional Equipment:   Intra-op Plan:   Post-operative Plan:   Informed Consent: I have reviewed the patients History and Physical, chart, labs and discussed the procedure including the risks, benefits and alternatives for the proposed anesthesia with the patient or authorized representative who has indicated his/her understanding and acceptance.     Plan Discussed with: CRNA  Anesthesia Plan Comments:         Anesthesia Quick Evaluation

## 2018-11-29 NOTE — Transfer of Care (Signed)
Immediate Anesthesia Transfer of Care Note  Patient: Maria KocherRoslyn M Kemp  Procedure(s) Performed: COLONOSCOPY WITH PROPOFOL (N/A Rectum) POLYPECTOMY (N/A )  Patient Location: PACU  Anesthesia Type: General  Level of Consciousness: awake, alert  and patient cooperative  Airway and Oxygen Therapy: Patient Spontanous Breathing and Patient connected to supplemental oxygen  Post-op Assessment: Post-op Vital signs reviewed, Patient's Cardiovascular Status Stable, Respiratory Function Stable, Patent Airway and No signs of Nausea or vomiting  Post-op Vital Signs: Reviewed and stable  Complications: No apparent anesthesia complications

## 2018-11-29 NOTE — H&P (Signed)
Maria Miniumarren Barkley Kratochvil, MD [facility] [address] [place], [place] [address] Phone:[phone] Fax : [phone]  Primary Care Physician:  Sherlene Shamsullo, Teresa L, MD Primary Gastroenterologist:  Dr. Servando SnareWohl  Pre-Procedure History & Physical: HPI:  Maria Kemp is a 62 y.o. female is here for an colonoscopy.   Past Medical History:  Diagnosis Date  . Arthritis    hands  . Colon polyps   . Hyperlipidemia   . Hypertension   . PONV (postoperative nausea and vomiting)     Past Surgical History:  Procedure Laterality Date  . ABDOMINOPLASTY  1995  . CESAREAN SECTION  1989    Prior to Admission medications   Medication Sig Start Date End Date Taking? Authorizing Provider  amLODipine-atorvastatin (CADUET) 5-10 MG tablet Take 1 tablet by mouth daily. 11/17/18  Yes Sherlene Shamsullo, Teresa L, MD  Biotin (BIOTIN 5000) 5 MG CAPS Take 1 capsule by mouth daily.   Yes [provider]  Calcium Carbonate-Vitamin D (CALCIUM-VITAMIN D) 500-200 MG-UNIT tablet Take 1 tablet by mouth daily.    Yes [provider]  Cholecalciferol (D3-1000) 1000 UNITS tablet Take 1,000 Units by mouth daily.   Yes [provider]  EPINEPHrine 0.3 mg/0.3 mL IJ SOAJ injection USE AS DIRECTED 10/11/18  Yes Sherlene Shamsullo, Teresa L, MD  loratadine (CLARITIN) 10 MG tablet Take 10 mg by mouth daily.   Yes [provider]  Misc. Devices (ALL-BODY MASSAGE) MISC by Does not apply route.   Yes [provider]  Multiple Vitamin (MULTIVITAMIN) tablet Take by mouth.   Yes [provider]  Triamcinolone Acetonide (NASACORT ALLERGY 24HR NA) Place 1 spray into the nose daily.   Yes [provider]    Allergies as of 10/27/2018 - Review Complete 10/11/2018  Allergen Reaction Noted  . Codeine Anaphylaxis 09/04/2014  . Shrimp [shellfish allergy] Anaphylaxis 09/04/2014    Family History  Problem Relation Age of Onset  . Arthritis Mother   . Hyperlipidemia Mother   . Hypertension Mother   .  Heart disease Mother   . Arthritis Father   . Cancer Father   . Hyperlipidemia Father   . Hypertension Father   . Heart disease Father   . Diabetes Father   . Hyperlipidemia Brother   . Hypertension Brother     Social History   Socioeconomic History  . Marital status: Divorced    Spouse name: Not on file  . Number of children: Not on file  . Years of education: Not on file  . Highest education level: Not on file  Occupational History  . Not on file  Social Needs  . Financial resource strain: Not on file  . Food insecurity:    Worry: Not on file    Inability: Not on file  . Transportation needs:    Medical: Not on file    Non-medical: Not on file  Tobacco Use  . Smoking status: Never Smoker  . Smokeless tobacco: Never Used  Substance and Sexual Activity  . Alcohol use: Yes    Comment: rarely - 2-3x/yr  . Drug use: No  . Sexual activity: Not on file  Lifestyle  . Physical activity:    Days per week: Not on file    Minutes per session: Not on file  . Stress: Not on file  Relationships  . Social connections:    Talks on phone: Not on file    Gets together: Not on file    Attends religious service: Not on file  Active member of club or organization: Not on file    Attends meetings of clubs or organizations: Not on file    Relationship status: Not on file  . Intimate partner violence:    Fear of current or ex partner: Not on file    Emotionally abused: Not on file    Physically abused: Not on file    Forced sexual activity: Not on file  Other Topics Concern  . Not on file  Social History Narrative  . Not on file    Review of Systems: See HPI, otherwise negative ROS  Physical Exam: BP 123/79   Pulse 66   Temp 98.1 F (36.7 C) (Temporal)   Ht 5\' 3"  (1.6 m)   Wt 59.9 kg   SpO2 99%   BMI 23.38 kg/m  General:   Alert,  pleasant and cooperative in NAD Head:  Normocephalic and atraumatic. Neck:  Supple; no masses or thyromegaly. Lungs:  Clear  throughout to auscultation.    Heart:  Regular rate and rhythm. Abdomen:  Soft, nontender and nondistended. Normal bowel sounds, without guarding, and without rebound.   Neurologic:  Alert and  oriented x4;  grossly normal neurologically.  Impression/Plan: Maria Kemp is here for an colonoscopy to be performed for family history of colon polyps  Risks, benefits, limitations, and alternatives regarding  colonoscopy have been reviewed with the patient.  Questions have been answered.  All parties agreeable.   Maria Miniumarren Jonte Shiller, MD  11/29/2018, 7:28 AM

## 2018-11-29 NOTE — Anesthesia Procedure Notes (Signed)
Performed by: Kumari Sculley, CRNA Pre-anesthesia Checklist: Patient identified, Emergency Drugs available, Suction available, Timeout performed and Patient being monitored Patient Re-evaluated:Patient Re-evaluated prior to induction Oxygen Delivery Method: Nasal cannula Placement Confirmation: positive ETCO2       

## 2018-12-06 ENCOUNTER — Encounter: Payer: Self-pay | Admitting: Gastroenterology

## 2018-12-07 ENCOUNTER — Encounter: Payer: Self-pay | Admitting: Gastroenterology

## 2018-12-15 ENCOUNTER — Ambulatory Visit: Payer: Self-pay

## 2018-12-15 DIAGNOSIS — H2511 Age-related nuclear cataract, right eye: Secondary | ICD-10-CM | POA: Diagnosis not present

## 2018-12-15 DIAGNOSIS — H52221 Regular astigmatism, right eye: Secondary | ICD-10-CM | POA: Diagnosis not present

## 2018-12-15 DIAGNOSIS — H2510 Age-related nuclear cataract, unspecified eye: Secondary | ICD-10-CM | POA: Diagnosis not present

## 2018-12-15 DIAGNOSIS — H25811 Combined forms of age-related cataract, right eye: Secondary | ICD-10-CM | POA: Diagnosis not present

## 2018-12-22 ENCOUNTER — Encounter: Payer: Self-pay | Admitting: Obstetrics and Gynecology

## 2018-12-22 ENCOUNTER — Inpatient Hospital Stay: Payer: 59 | Attending: Obstetrics and Gynecology | Admitting: Obstetrics and Gynecology

## 2018-12-22 ENCOUNTER — Encounter (INDEPENDENT_AMBULATORY_CARE_PROVIDER_SITE_OTHER): Payer: Self-pay

## 2018-12-22 VITALS — BP 120/77 | HR 87 | Temp 97.9°F | Resp 18 | Ht 63.0 in | Wt 142.0 lb

## 2018-12-22 DIAGNOSIS — R87612 Low grade squamous intraepithelial lesion on cytologic smear of cervix (LGSIL): Secondary | ICD-10-CM

## 2018-12-22 NOTE — Progress Notes (Signed)
Gynecologic Oncology Visit   Referring Provider: Dr. Arvil ChacoVan Dalen  Chief Concern: abnormal Pap smears and VAIN1  Subjective:  Maria Kemp is a 63 y.o. female who has a long history of slightly abnormal Pap smears, mainly LGSIL, often negative biopsies after colposcopy. 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   Returns today for surveillance exam.  Her last PAP NIML and HRHPV negative on 03/17/2018. She also had colposcopy that day that was negative for any abnormalities in the vagina or cervix.   Today, she denies complaints, specifically denying vaginal bleeding, discharge, and/or pain.   Gynecologic History:  Dr Arvil ChacoVan Dalen performed LEEP in 1999 and CKC in 2006 for cervical dysplasia.  Subsequent Pap smears vary between normal and LGSIL with high risk HPV.  Since 2013 her q6 month PAPs have shown ASCUS, Normal, LSIL, LSIL.  No HPV testing or colposcopy was done during that time period.  10/2014 she saw Dr. Johnnette LitterBerchuck. Colposcopy was performed. The transformation zone was not visible, but there were no lesions. Pap NILM and HR-HPV negative.   She was seen by Dr Johnnette LitterBerchuck 12/16 for colposcopy given LSIL cervical PAP, Vaginal Pap: ASCUS, negative HR HPV on 11/16. Biopsy of small cyst in the vagina just below the cervix was performed.  DIAGNOSIS:  A. VAGINA; BIOPSY:  - INFLAMED VAGINAL MUCOSA WITH A LOW GRADE SQUAMOUS INTRAEPITHELIAL  LESION (VAIN 1) AND PARTIAL BENIGN CYST.   PAP/HPV normal 12/17.   06/17/17- LSIL PAP/HPV Positive   03/17/2018 Colposcopy vagina and cervix, negative.     Problem List: Patient Active Problem List   Diagnosis Date Noted  . Bilateral cataracts 10/12/2018  . Cyst of joint of hand, right 10/12/2018  . Allergic conjunctivitis 03/02/2018  . Caregiver with fatigue 11/04/2017  . Breast nodule 03/22/2017  . Vaginal dysplasia 05/21/2016  . Low grade squamous intraepithelial lesion on cytologic smear of cervix (LGSIL) 05/21/2016  . History of colonic polyps  03/17/2016  . H/O sebaceous cyst 09/06/2015  . Hyperlipidemia LDL goal <130 09/06/2015  . Pruritus 03/05/2015  . Lichen planus 09/04/2014  . Essential hypertension, benign 09/04/2014  . Family history of colonic polyps 09/04/2014  . H/O rotator cuff tear 09/04/2014   Past Medical History: Past Medical History:  Diagnosis Date  . Arthritis    hands  . Colon polyps   . Hyperlipidemia   . Hypertension   . PONV (postoperative nausea and vomiting)    Past Surgical History: Past Surgical History:  Procedure Laterality Date  . ABDOMINOPLASTY  1995  . CATARACT EXTRACTION, BILATERAL    . CESAREAN SECTION  1989  . COLONOSCOPY WITH PROPOFOL N/A 11/29/2018   Procedure: COLONOSCOPY WITH PROPOFOL;  Surgeon: Midge MiniumWohl, Darren, MD;  [facility];  Service: Endoscopy;  Laterality: N/A;  . POLYPECTOMY N/A 11/29/2018   Procedure: POLYPECTOMY;  Surgeon: Midge MiniumWohl, Darren, MD;  [facility];  Service: Endoscopy;  Laterality: N/A;   Past Gynecologic History:  Menarche: 11 Menstrual details: postmenopausal  OB History: G3P2  Family History: Family History  Problem Relation Age of Onset  . Arthritis Mother   . Hyperlipidemia Mother   . Hypertension Mother   . Heart disease Mother   . Arthritis Father   . Cancer Father   . Hyperlipidemia Father   . Hypertension Father   . Heart disease Father   . Diabetes Father   . Hyperlipidemia Brother   . Hypertension Brother    Social History: Social History   Socioeconomic History  .  Marital status: Divorced    Spouse name: Not on file  . Number of children: Not on file  . Years of education: Not on file  . Highest education level: Not on file  Occupational History  . Not on file  Social Needs  . Financial resource strain: Not on file  . Food insecurity:    Worry: Not on file    Inability: Not on file  . Transportation needs:    Medical: Not on file    Non-medical: Not on file  Tobacco Use  . Smoking  status: Never Smoker  . Smokeless tobacco: Never Used  Substance and Sexual Activity  . Alcohol use: Yes    Comment: rarely - 2-3x/yr  . Drug use: No  . Sexual activity: Not on file  Lifestyle  . Physical activity:    Days per week: Not on file    Minutes per session: Not on file  . Stress: Not on file  Relationships  . Social connections:    Talks on phone: Not on file    Gets together: Not on file    Attends religious service: Not on file    Active member of club or organization: Not on file    Attends meetings of clubs or organizations: Not on file    Relationship status: Not on file  . Intimate partner violence:    Fear of current or ex partner: Not on file    Emotionally abused: Not on file    Physically abused: Not on file    Forced sexual activity: Not on file  Other Topics Concern  . Not on file  Social History Narrative  . Not on file   Allergies: Allergies  Allergen Reactions  . Codeine Anaphylaxis  . Shrimp [Shellfish Allergy] Anaphylaxis    All shell fish   Current Medications: Current Outpatient Medications  Medication Sig Dispense Refill  . amLODipine-atorvastatin (CADUET) 5-10 MG tablet Take 1 tablet by mouth daily. 90 tablet 1  . Biotin (BIOTIN 5000) 5 MG CAPS Take 1 capsule by mouth daily.    . Calcium Carbonate-Vitamin D (CALCIUM-VITAMIN D) 500-200 MG-UNIT tablet Take 1 tablet by mouth daily.     . Cholecalciferol (D3-1000) 1000 UNITS tablet Take 1,000 Units by mouth daily.    Marland Kitchen EPINEPHrine 0.3 mg/0.3 mL IJ SOAJ injection USE AS DIRECTED 2 Device 0  . loratadine (CLARITIN) 10 MG tablet Take 10 mg by mouth daily.    . Misc. Devices (ALL-BODY MASSAGE) MISC by Does not apply route.    . Multiple Vitamin (MULTIVITAMIN) tablet Take by mouth.    . Triamcinolone Acetonide (NASACORT ALLERGY 24HR NA) Place 1 spray into the nose daily.     No current facility-administered medications for this visit.         Objective:  Physical Examination:    Deferred  Assessment:  Maria Kemp is a 63 y.o. 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 Cervical os stenosis. LSIL Pap smear with positive HPV 06/17/2017. Negative colposcopy with negative Pap/HRHPV 03/17/2018.   Plan:   Problem List Items Addressed This Visit      Other   Low grade squamous intraepithelial lesion on cytologic smear of cervix (LGSIL) - Primary     History of dysplasia and LGSIL on Pap. Current results negative and reassuring. Reviewed current ASCCP guidelines and recommended repeat PAP/HPV in 03/2019. She will return to clinic at that time. No charge for  clinic visit today.   Gilford Lardizabal Leta Jungling, MD   CC:  PCP Sherlene Shams, MD [address] [place], [place] [address] [phone]

## 2018-12-22 NOTE — Progress Notes (Signed)
Pt not having in gyn concerns.

## 2019-02-14 ENCOUNTER — Other Ambulatory Visit: Payer: Self-pay | Admitting: Internal Medicine

## 2019-03-30 ENCOUNTER — Inpatient Hospital Stay: Payer: 59

## 2019-04-04 ENCOUNTER — Telehealth: Payer: Self-pay | Admitting: Internal Medicine

## 2019-04-04 DIAGNOSIS — R5383 Other fatigue: Secondary | ICD-10-CM

## 2019-04-04 DIAGNOSIS — E785 Hyperlipidemia, unspecified: Secondary | ICD-10-CM

## 2019-04-04 DIAGNOSIS — I10 Essential (primary) hypertension: Secondary | ICD-10-CM

## 2019-04-04 DIAGNOSIS — R7301 Impaired fasting glucose: Secondary | ICD-10-CM

## 2019-04-04 NOTE — Telephone Encounter (Signed)
Pt has an appointment scheduled on 05/16/2019 for a follow up. Pt would like to have labs done before appt., does pt need labs?

## 2019-04-05 NOTE — Telephone Encounter (Signed)
Ordered CBC, CMP, A1c, Lipid, and TSH. Is there anything else that needs to be ordered for her alb appt in May?

## 2019-04-07 NOTE — Telephone Encounter (Signed)
Pt has been scheduled for fasting labs. Pt is aware of appt date and time.  

## 2019-04-07 NOTE — Telephone Encounter (Signed)
Perfect1  Thank you!

## 2019-04-11 ENCOUNTER — Ambulatory Visit: Payer: Self-pay | Admitting: Internal Medicine

## 2019-05-11 ENCOUNTER — Other Ambulatory Visit (INDEPENDENT_AMBULATORY_CARE_PROVIDER_SITE_OTHER): Payer: 59

## 2019-05-11 ENCOUNTER — Other Ambulatory Visit: Payer: Self-pay

## 2019-05-11 DIAGNOSIS — R5383 Other fatigue: Secondary | ICD-10-CM | POA: Diagnosis not present

## 2019-05-11 DIAGNOSIS — E785 Hyperlipidemia, unspecified: Secondary | ICD-10-CM | POA: Diagnosis not present

## 2019-05-11 DIAGNOSIS — R7301 Impaired fasting glucose: Secondary | ICD-10-CM | POA: Diagnosis not present

## 2019-05-11 DIAGNOSIS — I10 Essential (primary) hypertension: Secondary | ICD-10-CM | POA: Diagnosis not present

## 2019-05-11 LAB — COMPREHENSIVE METABOLIC PANEL
ALT: 18 U/L (ref 0–35)
AST: 17 U/L (ref 0–37)
Albumin: 4.1 g/dL (ref 3.5–5.2)
Alkaline Phosphatase: 71 U/L (ref 39–117)
BUN: 12 mg/dL (ref 6–23)
CO2: 31 mEq/L (ref 19–32)
Calcium: 9.4 mg/dL (ref 8.4–10.5)
Chloride: 100 mEq/L (ref 96–112)
Creatinine, Ser: 0.71 mg/dL (ref 0.40–1.20)
GFR: 100.36 mL/min (ref >60.00)
Glucose, Bld: 96 mg/dL (ref 70–99)
Potassium: 3.8 mEq/L (ref 3.5–5.1)
Sodium: 138 mEq/L (ref 135–145)
Total Bilirubin: 0.6 mg/dL (ref 0.2–1.2)
Total Protein: 6.7 g/dL (ref 6.0–8.3)

## 2019-05-11 LAB — CBC WITH DIFFERENTIAL/PLATELET
Basophils Absolute: 0 10*3/uL (ref 0.0–0.1)
Basophils Relative: 1.2 % (ref 0.0–3.0)
Eosinophils Absolute: 0 10*3/uL (ref 0.0–0.7)
Eosinophils Relative: 1.3 % (ref 0.0–5.0)
HCT: 39.3 % (ref 36.0–46.0)
Hemoglobin: 13.5 g/dL (ref 12.0–15.0)
Lymphocytes Relative: 38.9 % (ref 12.0–46.0)
Lymphs Abs: 1.2 10*3/uL (ref 0.7–4.0)
MCHC: 34.3 g/dL (ref 30.0–36.0)
MCV: 89.9 fl (ref 78.0–100.0)
Monocytes Absolute: 0.5 10*3/uL (ref 0.1–1.0)
Monocytes Relative: 15 % — ABNORMAL HIGH (ref 3.0–12.0)
Neutro Abs: 1.3 10*3/uL — ABNORMAL LOW (ref 1.4–7.7)
Neutrophils Relative %: 43.6 % (ref 43.0–77.0)
Platelets: 326 10*3/uL (ref 150.0–400.0)
RBC: 4.37 Mil/uL (ref 3.87–5.11)
RDW: 13.7 % (ref 11.5–15.5)
WBC: 3 10*3/uL — ABNORMAL LOW (ref 4.0–10.5)

## 2019-05-11 LAB — LIPID PANEL
Cholesterol: 163 mg/dL (ref 0–200)
HDL: 57.5 mg/dL (ref >39.00)
LDL Cholesterol: 89 mg/dL (ref 0–99)
NonHDL: 105.29
Total CHOL/HDL Ratio: 3
Triglycerides: 79 mg/dL (ref 0.0–149.0)
VLDL: 15.8 mg/dL (ref 0.0–40.0)

## 2019-05-11 LAB — HEMOGLOBIN A1C: Hgb A1c MFr Bld: 6.1 % (ref 4.6–6.5)

## 2019-05-11 LAB — TSH: TSH: 1.16 u[IU]/mL (ref 0.35–4.50)

## 2019-05-13 ENCOUNTER — Other Ambulatory Visit: Payer: Self-pay

## 2019-05-16 ENCOUNTER — Other Ambulatory Visit: Payer: Self-pay

## 2019-05-16 ENCOUNTER — Ambulatory Visit: Payer: Self-pay | Admitting: Internal Medicine

## 2019-05-16 ENCOUNTER — Encounter: Payer: Self-pay | Admitting: Internal Medicine

## 2019-05-16 ENCOUNTER — Ambulatory Visit: Payer: 59 | Admitting: Internal Medicine

## 2019-05-16 DIAGNOSIS — E785 Hyperlipidemia, unspecified: Secondary | ICD-10-CM

## 2019-05-16 DIAGNOSIS — R5383 Other fatigue: Secondary | ICD-10-CM | POA: Diagnosis not present

## 2019-05-16 DIAGNOSIS — I10 Essential (primary) hypertension: Secondary | ICD-10-CM | POA: Diagnosis not present

## 2019-05-16 NOTE — Progress Notes (Signed)
Subjective:  Patient ID: Maria Kemp, female    DOB: 09-Mar-1956  Age: 63 y.o. MRN: [id number]  CC: Diagnoses of Caregiver with fatigue, Essential hypertension, benign, and Hyperlipidemia LDL goal <130 were pertinent to this visit.  HPI Maria Kemp presents for 6 month follow up on hypertension , caregiver fatigu secondary to care of her aging mother who is apparently suffering from untreated anxiety,  Fixed delusion and personality disorder  40 minutes was spent discussing the conflict with her mother  And the lack of a treatable diagnosis,  She has apparently not been diagnosed with dementia despite cognitive decline noted by daughter and  evidence of prior  CVAs on brain MRI .  Patient is taking her medications as prescribed and notes no adverse effects.  Home BP readings have been done about once per week and are  generally < 130/80 .  She is avoiding added salt in her diet and walking regularly about 3 times per week for exercise  .  Outpatient Medications Prior to Visit  Medication Sig Dispense Refill  . amLODipine (NORVASC) 5 MG tablet TAKE 1 TABLET BY MOUTH DAILY 90 tablet 1  . atorvastatin (LIPITOR) 10 MG tablet TAKE 1 TABLET BY MOUTH DAILY 90 tablet 1  . Biotin (BIOTIN 5000) 5 MG CAPS Take 1 capsule by mouth daily.    . Calcium Carbonate-Vitamin D (CALCIUM-VITAMIN D) 500-200 MG-UNIT tablet Take 1 tablet by mouth daily.     . Cholecalciferol (D3-1000) 1000 UNITS tablet Take 1,000 Units by mouth daily.    Marland Kitchen EPINEPHrine 0.3 mg/0.3 mL IJ SOAJ injection USE AS DIRECTED 2 Device 0  . loratadine (CLARITIN) 10 MG tablet Take 10 mg by mouth daily.    . Misc. Devices (ALL-BODY MASSAGE) MISC by Does not apply route.    . Multiple Vitamin (MULTIVITAMIN) tablet Take by mouth.    . Triamcinolone Acetonide (NASACORT ALLERGY 24HR NA) Place 1 spray into the nose daily.    Marland Kitchen amLODipine-atorvastatin (CADUET) 5-10 MG tablet Take 1 tablet by mouth daily. (Patient not taking: Reported on 05/16/2019)  90 tablet 1   No facility-administered medications prior to visit.     Review of Systems;  Patient denies headache, fevers, malaise, unintentional weight loss, skin rash, eye pain, sinus congestion and sinus pain, sore throat, dysphagia,  hemoptysis , cough, dyspnea, wheezing, chest pain, palpitations, orthopnea, edema, abdominal pain, nausea, melena, diarrhea, constipation, flank pain, dysuria, hematuria, urinary  Frequency, nocturia, numbness, tingling, seizures,  Focal weakness, Loss of consciousness,  Tremor, insomnia, depression, anxiety, and suicidal ideation.      Objective:  BP 126/78 (BP Location: Left Arm, Patient Position: Sitting, Cuff Size: Normal)   Pulse 78   Temp 98.4 F (36.9 C) (Oral)   Resp 14   Ht 5\' 3"  (1.6 m)   Wt 142 lb 12.8 oz (64.8 kg)   SpO2 96%   BMI 25.30 kg/m   BP Readings from Last 3 Encounters:  05/16/19 126/78  12/22/18 120/77  11/29/18 108/81    Wt Readings from Last 3 Encounters:  05/16/19 142 lb 12.8 oz (64.8 kg)  12/22/18 142 lb (64.4 kg)  11/29/18 132 lb (59.9 kg)    General appearance: alert, cooperative and appears stated age Ears: normal TM's and external ear canals both ears Throat: lips, mucosa, and tongue normal; teeth and gums normal Neck: no adenopathy, no carotid bruit, supple, symmetrical, trachea midline and thyroid not enlarged, symmetric, no tenderness/mass/nodules Back: symmetric, no curvature. ROM normal. No CVA  tenderness. Lungs: clear to auscultation bilaterally Heart: regular rate and rhythm, S1, S2 normal, no murmur, click, rub or gallop Abdomen: soft, non-tender; bowel sounds normal; no masses,  no organomegaly Pulses: 2+ and symmetric Skin: Skin color, texture, turgor normal. No rashes or lesions Lymph nodes: Cervical, supraclavicular, and axillary nodes normal.  Lab Results  Component Value Date   HGBA1C 6.1 05/11/2019    Lab Results  Component Value Date   CREATININE 0.71 05/11/2019   CREATININE 0.96  10/11/2018   CREATININE 1.15 11/02/2017    Lab Results  Component Value Date   WBC 3.0 (L) 05/11/2019   HGB 13.5 05/11/2019   HCT 39.3 05/11/2019   PLT 326.0 05/11/2019   GLUCOSE 96 05/11/2019   CHOL 163 05/11/2019   TRIG 79.0 05/11/2019   HDL 57.50 05/11/2019   LDLDIRECT 90.0 03/05/2016   LDLCALC 89 05/11/2019   ALT 18 05/11/2019   AST 17 05/11/2019   NA 138 05/11/2019   K 3.8 05/11/2019   CL 100 05/11/2019   CREATININE 0.71 05/11/2019   BUN 12 05/11/2019   CO2 31 05/11/2019   TSH 1.16 05/11/2019   HGBA1C 6.1 05/11/2019   MICROALBUR 2.6 (H) 05/11/2017    No results found.  Assessment & Plan:   Problem List Items Addressed This Visit    Essential hypertension, benign    Well controlled on current regimen. Renal function stable, no changes today.  Lab Results  Component Value Date   CREATININE 0.71 05/11/2019   Lab Results  Component Value Date   NA 138 05/11/2019   K 3.8 05/11/2019   CL 100 05/11/2019   CO2 31 05/11/2019         Hyperlipidemia LDL goal <130    Managed with 10 mg atorvastatin  (Caduet) . She has no side effects and will return for fasting labs and liver enzymes . No changes today. Nonfasting LDl is 90  Lab Results  Component Value Date   CHOL 163 05/11/2019   HDL 57.50 05/11/2019   LDLCALC 89 05/11/2019   LDLDIRECT 90.0 03/05/2016   TRIG 79.0 05/11/2019   CHOLHDL 3 05/11/2019         Caregiver with fatigue    She has had increased emotional stress due to the dysfunctional relationship she has with her mother who is unable to walk despite no evidence of a MSK or neurologic etiology. Advised her to seek geriatric psychiatric evaluation for her mother .         I have discontinued Maria Kemp's amLODipine-atorvastatin. I am also having her maintain her Triamcinolone Acetonide (NASACORT ALLERGY 24HR NA), Cholecalciferol, calcium-vitamin D, multivitamin, loratadine, Biotin, All-Body Massage, EPINEPHrine, atorvastatin, and  amLODipine.  No orders of the defined types were placed in this encounter.   Medications Discontinued During This Encounter  Medication Reason  . amLODipine-atorvastatin (CADUET) 5-10 MG tablet Error    Follow-up: No follow-ups on file.   Sherlene Shamseresa L Mont Jagoda, MD

## 2019-05-17 NOTE — Assessment & Plan Note (Signed)
Managed with 10 mg atorvastatin  (Caduet) . She has no side effects and will return for fasting labs and liver enzymes . No changes today. Nonfasting LDl is 90 ° °Lab Results  °Component Value Date  ° CHOL 163 05/11/2019  ° HDL 57.50 05/11/2019  ° LDLCALC 89 05/11/2019  ° LDLDIRECT 90.0 03/05/2016  ° TRIG 79.0 05/11/2019  ° CHOLHDL 3 05/11/2019  ° ° °

## 2019-05-17 NOTE — Assessment & Plan Note (Signed)
She has had increased emotional stress due to the dysfunctional relationship she has with her mother who is unable to walk despite no evidence of a MSK or neurologic etiology. Advised her to seek geriatric psychiatric evaluation for her mother .

## 2019-05-17 NOTE — Assessment & Plan Note (Signed)
Well controlled on current regimen. Renal function stable, no changes today.  Lab Results  Component Value Date   CREATININE 0.71 05/11/2019   Lab Results  Component Value Date   NA 138 05/11/2019   K 3.8 05/11/2019   CL 100 05/11/2019   CO2 31 05/11/2019

## 2019-05-24 ENCOUNTER — Other Ambulatory Visit: Payer: Self-pay

## 2019-05-25 ENCOUNTER — Inpatient Hospital Stay: Payer: 59 | Attending: Obstetrics and Gynecology | Admitting: Obstetrics and Gynecology

## 2019-05-25 ENCOUNTER — Encounter: Payer: Self-pay | Admitting: Obstetrics and Gynecology

## 2019-05-25 ENCOUNTER — Other Ambulatory Visit: Payer: Self-pay

## 2019-05-25 VITALS — BP 110/77 | Ht 63.0 in | Wt 141.4 lb

## 2019-05-25 DIAGNOSIS — R87612 Low grade squamous intraepithelial lesion on cytologic smear of cervix (LGSIL): Secondary | ICD-10-CM | POA: Insufficient documentation

## 2019-05-25 NOTE — Progress Notes (Signed)
Gynecologic Oncology Visit   Referring Provider: Dr. Arvil ChacoVan Dalen  Chief Concern: abnormal Pap smears and VAIN1  Subjective:  Maria Kemp is a 63 y.o. female who has a long history of slightly abnormal Pap smears, mainly LGSIL, often negative biopsies after colposcopy. 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 She returns to clinic today for surveillance exam.   Her last PAP NIML and HRHPV negative on 03/17/2018. She also had colposcopy that day that was negative for any abnormalities in the vagina or cervix.   Today, she denies complaints, specifically denying vaginal bleeding, discharge, and/or pain.   Gynecologic History:  Dr Arvil ChacoVan Dalen performed LEEP in 1999 and CKC in 2006 for cervical dysplasia.  Subsequent Pap smears vary between normal and LGSIL with high risk HPV.  Since 2013 her q6 month PAPs have shown ASCUS, Normal, LSIL, LSIL.  No HPV testing or colposcopy was done during that time period.  10/2014 she saw Dr. Johnnette LitterBerchuck. Colposcopy was performed. The transformation zone was not visible, but there were no lesions. Pap NILM and HR-HPV negative.   She was seen by Dr Johnnette LitterBerchuck 12/16 for colposcopy given LSIL cervical PAP, Vaginal Pap: ASCUS, negative HR HPV on 11/16. Biopsy of small cyst in the vagina just below the cervix was performed.  DIAGNOSIS:  A. VAGINA; BIOPSY:  - INFLAMED VAGINAL MUCOSA WITH A LOW GRADE SQUAMOUS INTRAEPITHELIAL LESION (VAIN 1) AND PARTIAL BENIGN CYST.   PAP/HPV normal 12/17.   06/17/17- LSIL PAP/HPV Positive   03/17/2018 Colposcopy vagina and cervix, negative.   Problem List: Patient Active Problem List   Diagnosis Date Noted  . Bilateral cataracts 10/12/2018  . Cyst of joint of hand, right 10/12/2018  . Allergic conjunctivitis 03/02/2018  . Caregiver with fatigue 11/04/2017  . Breast nodule 03/22/2017  . Vaginal dysplasia 05/21/2016  . Low grade squamous intraepithelial lesion on cytologic smear of cervix (LGSIL) 05/21/2016  . History of colonic  polyps 03/17/2016  . H/O sebaceous cyst 09/06/2015  . Hyperlipidemia LDL goal <130 09/06/2015  . Pruritus 03/05/2015  . Lichen planus 09/04/2014  . Essential hypertension, benign 09/04/2014  . Family history of colonic polyps 09/04/2014  . H/O rotator cuff tear 09/04/2014   Past Medical History: Past Medical History:  Diagnosis Date  . Arthritis    hands  . Colon polyps   . Hyperlipidemia   . Hypertension   . PONV (postoperative nausea and vomiting)    Past Surgical History: Past Surgical History:  Procedure Laterality Date  . ABDOMINOPLASTY  1995  . CATARACT EXTRACTION, BILATERAL    . CESAREAN SECTION  1989  . COLONOSCOPY WITH PROPOFOL N/A 11/29/2018   Procedure: COLONOSCOPY WITH PROPOFOL;  Surgeon: Midge MiniumWohl, Darren, MD;  [facility];  Service: Endoscopy;  Laterality: N/A;  . POLYPECTOMY N/A 11/29/2018   Procedure: POLYPECTOMY;  Surgeon: Midge MiniumWohl, Darren, MD;  [facility];  Service: Endoscopy;  Laterality: N/A;   Past Gynecologic History:  Menarche: 11 Menstrual details: postmenopausal  OB History: G3P2  Family History: Family History  Problem Relation Age of Onset  . Arthritis Mother   . Hyperlipidemia Mother   . Hypertension Mother   . Heart disease Mother   . Arthritis Father   . Cancer Father   . Hyperlipidemia Father   . Hypertension Father   . Heart disease Father   . Diabetes Father   . Hyperlipidemia Brother   . Hypertension Brother    Social History: Social History   Socioeconomic History  .  Marital status: Divorced    Spouse name: Not on file  . Number of children: Not on file  . Years of education: Not on file  . Highest education level: Not on file  Occupational History  . Not on file  Social Needs  . Financial resource strain: Not on file  . Food insecurity    Worry: Not on file    Inability: Not on file  . Transportation needs    Medical: Not on file    Non-medical: Not on file  Tobacco Use  . Smoking  status: Never Smoker  . Smokeless tobacco: Never Used  Substance and Sexual Activity  . Alcohol use: Yes    Comment: rarely - 2-3x/yr  . Drug use: No  . Sexual activity: Not on file  Lifestyle  . Physical activity    Days per week: Not on file    Minutes per session: Not on file  . Stress: Not on file  Relationships  . Social Musicianconnections    Talks on phone: Not on file    Gets together: Not on file    Attends religious service: Not on file    Active member of club or organization: Not on file    Attends meetings of clubs or organizations: Not on file    Relationship status: Not on file  . Intimate partner violence    Fear of current or ex partner: Not on file    Emotionally abused: Not on file    Physically abused: Not on file    Forced sexual activity: Not on file  Other Topics Concern  . Not on file  Social History Narrative  . Not on file   Allergies: Allergies  Allergen Reactions  . Codeine Anaphylaxis  . Shrimp [Shellfish Allergy] Anaphylaxis    All shell fish   Current Medications: Current Outpatient Medications  Medication Sig Dispense Refill  . amLODipine (NORVASC) 5 MG tablet TAKE 1 TABLET BY MOUTH DAILY 90 tablet 1  . atorvastatin (LIPITOR) 10 MG tablet TAKE 1 TABLET BY MOUTH DAILY 90 tablet 1  . Biotin (BIOTIN 5000) 5 MG CAPS Take 1 capsule by mouth daily.    . Calcium Carbonate-Vitamin D (CALCIUM-VITAMIN D) 500-200 MG-UNIT tablet Take 1 tablet by mouth daily.     . Cholecalciferol (D3-1000) 1000 UNITS tablet Take 1,000 Units by mouth daily.    Marland Kitchen. EPINEPHrine 0.3 mg/0.3 mL IJ SOAJ injection USE AS DIRECTED 2 Device 0  . fluticasone (FLONASE) 50 MCG/ACT nasal spray Place 1 spray into both nostrils daily.    Marland Kitchen. levocetirizine (XYZAL) 5 MG tablet Take 5 mg by mouth every evening.    . Multiple Vitamin (MULTIVITAMIN) tablet Take by mouth.    . loratadine (CLARITIN) 10 MG tablet Take 10 mg by mouth daily.    . Misc. Devices (ALL-BODY MASSAGE) MISC by Does not  apply route.    . Triamcinolone Acetonide (NASACORT ALLERGY 24HR NA) Place 1 spray into the nose daily.     No current facility-administered medications for this visit.         Objective:  Physical Examination:  Today's Vitals   05/25/19 1539  BP: 110/77  Weight: 141 lb 6.4 oz (64.1 kg)  Height: 5\' 3"  (1.6 m)   Body mass index is 25.05 kg/m.  GENERAL: Patient is a well appearing female in no acute distress HEENT:  Sclera clear. Anicteric NODES:  Negative axillary, supraclavicular, inguinal lymph node survery LUNGS:  Clear to auscultation bilaterally.   HEART:  Regular rate and rhythm.  ABDOMEN:  Soft, nontender.  SKIN:  Clear with no obvious rashes or skin changes.  NEURO:  Nonfocal. Well oriented.  Appropriate affect.  Pelvic:  EGBUS: atrophic Vagina: atrophic Cervix: flush with vault, no lesions Uterus: normal Bimanual: no masses or tenderness  Labs:  No labs on site today  Imaging: No imaging on site today  Assessment:  Maria Kemp is a 63 y.o. 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 Cervical os stenosis. LSIL Pap smear with positive HPV 06/17/2017. Negative colposcopy with negative Pap/HRHPV 03/17/2018. Normal exam today.   Plan:   Problem List Items Addressed This Visit      Other   Low grade squamous intraepithelial lesion on cytologic smear of cervix (LGSIL) - Primary   Relevant Orders   Pap liquid-based and HPV (high risk)     History of dysplasia and LGSIL on Pap. Last results negative and reassuring. Reviewed current ASCCP guidelines and recommended repeat PAP/HPV in 05/2020 unless abnormal from today. She will return to clinic at that time. No charge for clinic visit today.   Verlon Au, NP  I personally interviewed and examined the patient. Agreed with the above/below plan of care. I have directly contributed to assessment and plan of care of this patient and educated and discussed  with patient and family.  Mellody Drown, MD   CC:  PCP Crecencio Mc, MD [place], [place] [id number] [phone]

## 2019-05-28 LAB — HM MAMMOGRAPHY

## 2019-06-09 ENCOUNTER — Other Ambulatory Visit: Payer: Self-pay

## 2019-06-09 ENCOUNTER — Emergency Department
Admission: EM | Admit: 2019-06-09 | Discharge: 2019-06-09 | Disposition: A | Discharge status: Home / Self Care | Payer: 59 | Attending: Emergency Medicine | Admitting: Emergency Medicine

## 2019-06-09 ENCOUNTER — Encounter: Payer: Self-pay | Admitting: *Deleted

## 2019-06-09 ENCOUNTER — Emergency Department: Payer: 59

## 2019-06-09 DIAGNOSIS — R531 Weakness: Secondary | ICD-10-CM | POA: Diagnosis not present

## 2019-06-09 DIAGNOSIS — R0789 Other chest pain: Secondary | ICD-10-CM | POA: Diagnosis not present

## 2019-06-09 DIAGNOSIS — Z79899 Other long term (current) drug therapy: Secondary | ICD-10-CM | POA: Diagnosis not present

## 2019-06-09 DIAGNOSIS — R000 Tachycardia, unspecified: Secondary | ICD-10-CM | POA: Diagnosis present

## 2019-06-09 DIAGNOSIS — I471 Supraventricular tachycardia: Secondary | ICD-10-CM | POA: Insufficient documentation

## 2019-06-09 LAB — CBC
HCT: 40.6 % (ref 36.0–46.0)
Hemoglobin: 13.9 g/dL (ref 12.0–15.0)
MCH: 30 pg (ref 26.0–34.0)
MCHC: 34.2 g/dL (ref 30.0–36.0)
MCV: 87.5 fL (ref 80.0–100.0)
Platelets: 384 10*3/uL (ref 150–400)
RBC: 4.64 MIL/uL (ref 3.87–5.11)
RDW: 13.5 % (ref 11.5–15.5)
WBC: 4.7 10*3/uL (ref 4.0–10.5)
nRBC: 0 % (ref 0.0–0.2)

## 2019-06-09 LAB — COMPREHENSIVE METABOLIC PANEL
ALT: 21 U/L (ref 0–44)
AST: 22 U/L (ref 15–41)
Albumin: 4.3 g/dL (ref 3.5–5.0)
Alkaline Phosphatase: 73 U/L (ref 38–126)
Anion gap: 7 (ref 5–15)
BUN: 21 mg/dL (ref 8–23)
CO2: 27 mmol/L (ref 22–32)
Calcium: 9.3 mg/dL (ref 8.9–10.3)
Chloride: 108 mmol/L (ref 98–111)
Creatinine, Ser: 0.65 mg/dL (ref 0.44–1.00)
GFR calc Af Amer: >60 mL/min (ref >60)
GFR calc non Af Amer: >60 mL/min (ref >60)
Glucose, Bld: 115 mg/dL — ABNORMAL HIGH (ref 70–99)
Potassium: 3.6 mmol/L (ref 3.5–5.1)
Sodium: 142 mmol/L (ref 135–145)
Total Bilirubin: 0.6 mg/dL (ref 0.3–1.2)
Total Protein: 7.7 g/dL (ref 6.5–8.1)

## 2019-06-09 LAB — TROPONIN I (HIGH SENSITIVITY)
Troponin I (High Sensitivity): 17 ng/L (ref <18)
Troponin I (High Sensitivity): 23 ng/L — ABNORMAL HIGH (ref <18)

## 2019-06-09 MED ORDER — ADENOSINE 6 MG/2ML IV SOLN
INTRAVENOUS | Status: AC
  Administered 2019-06-09: 6 mg
  Filled 2019-06-09: qty 2

## 2019-06-09 MED ORDER — METOPROLOL TARTRATE 25 MG PO TABS
12.5000 mg | ORAL_TABLET | Freq: Once | ORAL | Status: AC
  Administered 2019-06-09: 12.5 mg via ORAL
  Filled 2019-06-09: qty 1

## 2019-06-09 MED ORDER — METOPROLOL TARTRATE 25 MG PO TABS: 12.5000 mg | ORAL_TABLET | Freq: Two times a day (BID) | ORAL | 0 refills | Status: DC

## 2019-06-09 MED ORDER — SODIUM CHLORIDE 0.9% FLUSH
3.0000 mL | Freq: Once | INTRAVENOUS | Status: AC
  Administered 2019-06-09: 3 mL via INTRAVENOUS

## 2019-06-09 MED ORDER — SODIUM CHLORIDE 0.9 % IV SOLN
1000.0000 mL | Freq: Once | INTRAVENOUS | Status: AC
  Administered 2019-06-09: 1000 mL via INTRAVENOUS

## 2019-06-09 NOTE — ED Notes (Signed)
ED Provider at bedside.Patient given 6mg  adenosine, back in sinus rhythm. Patient AxO x4

## 2019-06-09 NOTE — ED Triage Notes (Signed)
Pt to ED reporting Chest pain radiating to the left side of her jaw starting this afternoon. Weakness but no dizziness or lightheadedness. Pt reports having taken a nap while feeling weak and upon waking noticed her BP was low and her HR was elevated.

## 2019-06-09 NOTE — ED Provider Notes (Signed)
[facility] Emergency Department Provider Note   ____________________________________________    I have reviewed the triage vital signs and the nursing notes.   HISTORY  Chief Complaint Chest Pain and Tachycardia     HPI Maria Kemp is a 63 y.o. female who presents with complaints of rapid heart rate, weakness, chest discomfort.  Patient reports she felt weak after work today, went home took a nap and when she woke up she felt her heart was racing and she had a mild pressure in the center of her chest.  She thinks she has had an episode of heart racing in the past which resolved with rest.  Denies fevers or chills or shortness of breath.  No nausea or vomiting or diaphoresis.  Has not take anything for this.  Past Medical History:  Diagnosis Date  . Arthritis    hands  . Colon polyps   . Hyperlipidemia   . Hypertension   . PONV (postoperative nausea and vomiting)     Patient Active Problem List   Diagnosis Date Noted  . Bilateral cataracts 10/12/2018  . Cyst of joint of hand, right 10/12/2018  . Allergic conjunctivitis 03/02/2018  . Caregiver with fatigue 11/04/2017  . Breast nodule 03/22/2017  . Vaginal dysplasia 05/21/2016  . Low grade squamous intraepithelial lesion on cytologic smear of cervix (LGSIL) 05/21/2016  . History of colonic polyps 03/17/2016  . H/O sebaceous cyst 09/06/2015  . Hyperlipidemia LDL goal <130 09/06/2015  . Pruritus 03/05/2015  . Lichen planus 09/04/2014  . Essential hypertension, benign 09/04/2014  . Family history of colonic polyps 09/04/2014  . H/O rotator cuff tear 09/04/2014    Past Surgical History:  Procedure Laterality Date  . ABDOMINOPLASTY  1995  . CATARACT EXTRACTION, BILATERAL    . CESAREAN SECTION  1989  . COLONOSCOPY WITH PROPOFOL N/A 11/29/2018   Procedure: COLONOSCOPY WITH PROPOFOL;  Surgeon: Midge MiniumWohl, Darren, MD;  [facility];  Service: Endoscopy;  Laterality: N/A;  .  POLYPECTOMY N/A 11/29/2018   Procedure: POLYPECTOMY;  Surgeon: Midge MiniumWohl, Darren, MD;  [facility];  Service: Endoscopy;  Laterality: N/A;    Prior to Admission medications   Medication Sig Start Date End Date Taking? Authorizing Provider  amLODipine (NORVASC) 5 MG tablet TAKE 1 TABLET BY MOUTH DAILY 02/15/19   Sherlene Shamsullo, Teresa L, MD  atorvastatin (LIPITOR) 10 MG tablet TAKE 1 TABLET BY MOUTH DAILY 02/15/19   Sherlene Shamsullo, Teresa L, MD  Biotin (BIOTIN 5000) 5 MG CAPS Take 1 capsule by mouth daily.    [provider]  Calcium Carbonate-Vitamin D (CALCIUM-VITAMIN D) 500-200 MG-UNIT tablet Take 1 tablet by mouth daily.     [provider]  Cholecalciferol (D3-1000) 1000 UNITS tablet Take 1,000 Units by mouth daily.    [provider]  EPINEPHrine 0.3 mg/0.3 mL IJ SOAJ injection USE AS DIRECTED 10/11/18   Sherlene Shamsullo, Teresa L, MD  fluticasone (FLONASE) 50 MCG/ACT nasal spray Place 1 spray into both nostrils daily.    [provider]  levocetirizine (XYZAL) 5 MG tablet Take 5 mg by mouth every evening.    [provider]  loratadine (CLARITIN) 10 MG tablet Take 10 mg by mouth daily.    [provider]  metoprolol tartrate (LOPRESSOR) 25 MG tablet Take 0.5 tablets (12.5 mg total) by mouth 2 (two) times daily. 06/09/19 07/09/19  Jene EveryKinner, Keeanna Villafranca, MD  Misc. Devices (ALL-BODY MASSAGE) MISC by Does not apply route.    [provider]  Multiple Vitamin (MULTIVITAMIN) tablet Take by mouth.    [provider]  Triamcinolone Acetonide (NASACORT ALLERGY 24HR NA) Place 1 spray into the nose daily.    [provider]     Allergies Codeine and Shrimp [shellfish allergy]  Family History  Problem Relation Age of Onset  . Arthritis Mother   . Hyperlipidemia Mother   . Hypertension Mother   . Heart disease Mother   . Arthritis Father   . Cancer Father   . Hyperlipidemia Father   . Hypertension Father   . Heart disease Father   .  Diabetes Father   . Hyperlipidemia Brother   . Hypertension Brother     Social History Social History   Tobacco Use  . Smoking status: Never Smoker  . Smokeless tobacco: Never Used  Substance Use Topics  . Alcohol use: Yes    Comment: rarely - 2-3x/yr  . Drug use: No    Review of Systems  Constitutional: No fever/chills Eyes: No visual changes.  ENT: No sore throat. Cardiovascular: As above Respiratory: Denies shortness of breath. Gastrointestinal: No abdominal pain.  Genitourinary: Negative for dysuria. Musculoskeletal: Negative for back pain. Skin: Negative for rash. Neurological: Negative for headaches    ____________________________________________   PHYSICAL EXAM:  VITAL SIGNS: ED Triage Vitals  Enc Vitals Group     BP 06/09/19 2041 105/81     Pulse Rate 06/09/19 2041 (!) 146     Resp 06/09/19 2041 18     Temp --      Temp src --      SpO2 06/09/19 2041 99 %     Weight 06/09/19 2024 61.2 kg (135 lb)     Height 06/09/19 2024 1.6 m (5\' 3" )     Head Circumference --      Peak Flow --      Pain Score 06/09/19 2023 4     Pain Loc --      Pain Edu? --      Excl. in GC? --     Constitutional: Alert and oriented.   Nose: No congestion/rhinnorhea. Mouth/Throat: Mucous membranes are moist.    Cardiovascular: Tachycardia, regular rhythm. Grossly normal heart sounds.   Respiratory: Normal respiratory effort.  No retractions. Lungs CTAB. Gastrointestinal: Soft and nontender. No distention.   Genitourinary: deferred Musculoskeletal:  Warm and well perfused Neurologic:  Normal speech and language. No gross focal neurologic deficits are appreciated.  Skin:  Skin is warm, dry and intact. No rash noted. Psychiatric: Mood and affect are normal. Speech and behavior are normal.  ____________________________________________   LABS (all labs ordered are listed, but only abnormal results are displayed)  Labs Reviewed  COMPREHENSIVE METABOLIC PANEL - Abnormal;  Notable for the following components:      Result Value   Glucose, Bld 115 (*)    All other components within normal limits  TROPONIN I (HIGH SENSITIVITY) - Abnormal; Notable for the following components:   Troponin I (High Sensitivity) 23 (*)    All other components within normal limits  CBC  TROPONIN I (HIGH SENSITIVITY)   ____________________________________________  EKG  ED ECG REPORT I, Jene Everyobert Crosby Oriordan, the attending physician, personally viewed and interpreted this ECG.  Date: 06/09/2019   Rhythm: Supraventricular tachycardia QRS Axis: normal Intervals: normal ST/T Wave abnormalities: normal Narrative Interpretation: SVT  ED ECG REPORT I, Jene Everyobert Anneka Studer, the attending physician, personally viewed and interpreted this ECG.  Date: 06/09/2019  Rhythm: Sinus tachycardia QRS Axis: normal Intervals: normal ST/T Wave abnormalities:  normal Narrative Interpretation: no evidence of acute ischemia   ____________________________________________  RADIOLOGY  X-ray unremarkable ____________________________________________   PROCEDURES  Procedure(s) performed: No  Procedures   Critical Care performed: yes  CRITICAL CARE Performed by: Lavonia Drafts   Total critical care time: 30 minutes  Critical care time was exclusive of separately billable procedures and treating other patients.  Critical care was necessary to treat or prevent imminent or life-threatening deterioration.  Critical care was time spent personally by me on the following activities: development of treatment plan with patient and/or surrogate as well as nursing, discussions with consultants, evaluation of patient's response to treatment, examination of patient, obtaining history from patient or surrogate, ordering and performing treatments and interventions, ordering and review of laboratory studies, ordering and review of radiographic studies, pulse oximetry and re-evaluation of patient's condition.   ____________________________________________   INITIAL IMPRESSION / ASSESSMENT AND PLAN / ED COURSE  Pertinent labs & imaging results that were available during my care of the patient were reviewed by me and considered in my medical decision making (see chart for details).  Patient presents with rapid heart rate, chest discomfort, found to be in SVT likely the cause of her symptoms.  Attempted vagal maneuvers unsuccessfully.  Therefore decision made to give adenosine.  6 mg of adenosine given while patient on monitor with pads in place.  Return to sinus rhythm, patient tolerated well and feeling much better.  Initial troponin 17, will check second troponin.  Discussed with Dr. Clayborn Bigness of cardiology who recommends starting low-dose metoprolol   Second troponin minimally elevated as expected by cardiologist who recommends discharge    ____________________________________________   FINAL CLINICAL IMPRESSION(S) / ED DIAGNOSES  Final diagnoses:  SVT (supraventricular tachycardia) ([place])        Note:  This document was prepared using Dragon voice recognition software and may include unintentional dictation errors.   Lavonia Drafts, MD 06/09/19 2347

## 2019-06-14 ENCOUNTER — Encounter: Payer: Self-pay | Admitting: Nurse Practitioner

## 2019-07-07 ENCOUNTER — Other Ambulatory Visit: Payer: Self-pay | Admitting: Internal Medicine

## 2019-08-03 ENCOUNTER — Telehealth: Payer: Self-pay

## 2019-08-03 NOTE — Telephone Encounter (Signed)
05/25/19 pap smear results mailed to home address along with next appointment as she states she did not receive her results.

## 2019-08-11 ENCOUNTER — Other Ambulatory Visit: Payer: Self-pay | Admitting: Internal Medicine

## 2019-11-30 ENCOUNTER — Inpatient Hospital Stay: Payer: 59

## 2019-12-08 ENCOUNTER — Other Ambulatory Visit: Payer: Self-pay | Admitting: Internal Medicine

## 2020-01-04 ENCOUNTER — Inpatient Hospital Stay: Payer: 59 | Attending: Obstetrics and Gynecology | Admitting: Obstetrics and Gynecology

## 2020-01-04 ENCOUNTER — Other Ambulatory Visit: Payer: Self-pay

## 2020-01-04 VITALS — BP 136/80 | HR 72 | Temp 98.0°F | Resp 18 | Wt 141.0 lb

## 2020-01-04 DIAGNOSIS — I10 Essential (primary) hypertension: Secondary | ICD-10-CM | POA: Insufficient documentation

## 2020-01-04 DIAGNOSIS — E785 Hyperlipidemia, unspecified: Secondary | ICD-10-CM | POA: Diagnosis not present

## 2020-01-04 DIAGNOSIS — Z79899 Other long term (current) drug therapy: Secondary | ICD-10-CM | POA: Diagnosis not present

## 2020-01-04 DIAGNOSIS — Z8719 Personal history of other diseases of the digestive system: Secondary | ICD-10-CM | POA: Diagnosis not present

## 2020-01-04 DIAGNOSIS — N890 Mild vaginal dysplasia: Secondary | ICD-10-CM | POA: Diagnosis present

## 2020-01-04 DIAGNOSIS — I471 Supraventricular tachycardia: Secondary | ICD-10-CM | POA: Insufficient documentation

## 2020-01-04 DIAGNOSIS — H269 Unspecified cataract: Secondary | ICD-10-CM | POA: Insufficient documentation

## 2020-01-04 DIAGNOSIS — R87612 Low grade squamous intraepithelial lesion on cytologic smear of cervix (LGSIL): Secondary | ICD-10-CM | POA: Diagnosis not present

## 2020-01-04 NOTE — Progress Notes (Signed)
Pt in for 6 month follow up, denies any concerns today. 

## 2020-01-04 NOTE — Progress Notes (Signed)
Gynecologic Oncology Visit   Referring Provider: Dr. Arvil Chaco  Chief Concern: abnormal Pap smears and VAIN1  Subjective:  Maria Kemp is a 64 y.o. female who has a long history of slightly abnormal Pap smears, mainly LGSIL, often negative biopsies after colposcopy. 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 She returns to clinic today for surveillance exam.   03/17/2018- Pap NILM, HRHPV negative; Colposcopy negative 05/25/2019- Pap LSIL, HR HPV negative.   She had an episode of SVT on 06/09/2019 that responded to adenosine. She started maintenance metoprolol.   Today, she denies complaints, specifically denying vaginal bleeding, discharge, and/or pain.   Gynecologic History:  Dr Arvil Chaco performed LEEP in 1999 and CKC in 2006 for cervical dysplasia.  Subsequent Pap smears vary between normal and LGSIL with high risk HPV.  Since 2013 her q6 month PAPs have shown ASCUS, Normal, LSIL, LSIL.  No HPV testing or colposcopy was done during that time period.  10/2014 she saw Dr. Johnnette Litter. Colposcopy was performed. The transformation zone was not visible, but there were no lesions. Pap NILM and HR-HPV negative.   She was seen by Dr Johnnette Litter 12/16 for colposcopy given LSIL cervical PAP, Vaginal Pap: ASCUS, negative HR HPV on 11/16. Biopsy of small cyst in the vagina just below the cervix was performed.  DIAGNOSIS:  A. VAGINA; BIOPSY:  - INFLAMED VAGINAL MUCOSA WITH A LOW GRADE SQUAMOUS INTRAEPITHELIAL LESION (VAIN 1) AND PARTIAL BENIGN CYST.   PAP/HPV normal 12/17.   06/17/17- LSIL PAP/HPV Positive   03/17/2018 Colposcopy vagina and cervix, negative.   Problem List: Patient Active Problem List   Diagnosis Date Noted  . Bilateral cataracts 10/12/2018  . Cyst of joint of hand, right 10/12/2018  . Allergic conjunctivitis 03/02/2018  . Caregiver with fatigue 11/04/2017  . Breast nodule 03/22/2017  . Vaginal dysplasia 05/21/2016  . Low grade squamous intraepithelial lesion on cytologic smear of  cervix (LGSIL) 05/21/2016  . History of colonic polyps 03/17/2016  . H/O sebaceous cyst 09/06/2015  . Hyperlipidemia LDL goal <130 09/06/2015  . Pruritus 03/05/2015  . Lichen planus 09/04/2014  . Essential hypertension, benign 09/04/2014  . Family history of colonic polyps 09/04/2014  . H/O rotator cuff tear 09/04/2014   Past Medical History: Past Medical History:  Diagnosis Date  . Arthritis    hands  . Colon polyps   . Hyperlipidemia   . Hypertension   . PONV (postoperative nausea and vomiting)    Past Surgical History: Past Surgical History:  Procedure Laterality Date  . ABDOMINOPLASTY  1995  . CATARACT EXTRACTION, BILATERAL    . CESAREAN SECTION  1989  . COLONOSCOPY WITH PROPOFOL N/A 11/29/2018   Procedure: COLONOSCOPY WITH PROPOFOL;  Surgeon: [name], MD;  [facility];  Service: Endoscopy;  Laterality: N/A;  . POLYPECTOMY N/A 11/29/2018   Procedure: POLYPECTOMY;  Surgeon: [name], MD;  [facility];  Service: Endoscopy;  Laterality: N/A;   Past Gynecologic History:  Menarche: 11 Menstrual details: postmenopausal  OB History: G3P2  Family History: Family History  Problem Relation Age of Onset  . Arthritis Mother   . Hyperlipidemia Mother   . Hypertension Mother   . Heart disease Mother   . Arthritis Father   . Cancer Father   . Hyperlipidemia Father   . Hypertension Father   . Heart disease Father   . Diabetes Father   . Hyperlipidemia Brother   . Hypertension Brother    Social History: Social History  Socioeconomic History  . Marital status: Divorced    Spouse name: Not on file  . Number of children: Not on file  . Years of education: Not on file  . Highest education level: Not on file  Occupational History  . Not on file  Tobacco Use  . Smoking status: Never Smoker  . Smokeless tobacco: Never Used  Substance and Sexual Activity  . Alcohol use: Yes    Comment: rarely - 2-3x/yr  . Drug use: No  .  Sexual activity: Not on file  Other Topics Concern  . Not on file  Social History Narrative  . Not on file   Social Determinants of Health   Financial Resource Strain:   . Difficulty of Paying Living Expenses: Not on file  Food Insecurity:   . Worried About Charity fundraiser in the Last Year: Not on file  . Ran Out of Food in the Last Year: Not on file  Transportation Needs:   . Lack of Transportation (Medical): Not on file  . Lack of Transportation (Non-Medical): Not on file  Physical Activity:   . Days of Exercise per Week: Not on file  . Minutes of Exercise per Session: Not on file  Stress:   . Feeling of Stress : Not on file  Social Connections:   . Frequency of Communication with Friends and Family: Not on file  . Frequency of Social Gatherings with Friends and Family: Not on file  . Attends Religious Services: Not on file  . Active Member of Clubs or Organizations: Not on file  . Attends Archivist Meetings: Not on file  . Marital Status: Not on file  Intimate Partner Violence:   . Fear of Current or Ex-Partner: Not on file  . Emotionally Abused: Not on file  . Physically Abused: Not on file  . Sexually Abused: Not on file   Allergies: Allergies  Allergen Reactions  . Codeine Anaphylaxis  . Shrimp [Shellfish Allergy] Anaphylaxis    All shell fish   Current Medications: Current Outpatient Medications  Medication Sig Dispense Refill  . amLODipine (NORVASC) 5 MG tablet TAKE ONE TABLET BY MOUTH EVERY DAY 90 tablet 1  . atorvastatin (LIPITOR) 10 MG tablet TAKE ONE TABLET BY MOUTH EVERY DAY 90 tablet 1  . Biotin (BIOTIN 5000) 5 MG CAPS Take 1 capsule by mouth daily.    . Calcium Carbonate-Vitamin D (CALCIUM-VITAMIN D) 500-200 MG-UNIT tablet Take 1 tablet by mouth daily.     . Cholecalciferol (D3-1000) 1000 UNITS tablet Take 1,000 Units by mouth daily.    Marland Kitchen EPINEPHRINE 0.3 mg/0.3 mL IJ SOAJ injection USE AS DIRECTED 2 each 0  . fluticasone (FLONASE) 50  MCG/ACT nasal spray Place 1 spray into both nostrils daily.    Marland Kitchen levocetirizine (XYZAL) 5 MG tablet Take 5 mg by mouth every evening.    . loratadine (CLARITIN) 10 MG tablet Take 10 mg by mouth daily.    . metoprolol tartrate (LOPRESSOR) 25 MG tablet TAKE 1/2 TABLET TWICE DAILY 90 tablet 1  . Misc. Devices (ALL-BODY MASSAGE) MISC by Does not apply route.    . Multiple Vitamin (MULTIVITAMIN) tablet Take by mouth.    . Triamcinolone Acetonide (NASACORT ALLERGY 24HR NA) Place 1 spray into the nose daily.     No current facility-administered medications for this visit.    Objective:  Physical Examination:  Today's Vitals   01/04/20 1459  BP: 136/80  Pulse: 72  Resp: 18  Temp: 98 F (  36.7 C)  TempSrc: Tympanic  SpO2: 100%  Weight: 141 lb (64 kg)  PainSc: 0-No pain   Body mass index is 24.98 kg/m.  GENERAL: Patient is a well appearing female in no acute distress HEENT:  Sclera clear. Anicteric NODES:  Negative axillary, supraclavicular, inguinal lymph node survery LUNGS:  Clear to auscultation bilaterally.   HEART:  Regular rate and rhythm.  ABDOMEN:  Soft, nontender.  No hernias, incisions well healed. No masses or ascites SKIN:  Clear with no obvious rashes or skin changes.  NEURO:  Nonfocal. Well oriented.  Appropriate affect.  Pelvic:  EGBUS: atrophic Vagina: atrophic Cervix: flush with vault, no lesions Uterus: normal Bimanual: no masses or tenderness  Labs:  No labs on site today  Imaging: No imaging on site today  Assessment:  Maria Kemp is a 64 y.o. 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 Cervical os stenosis. LSIL Pap smear with positive HPV 06/17/2017. Negative colposcopy with negative Pap/HRHPV 4/19, 6/20. Normal exam today. Asymptomatic.   Plan:   Problem List Items Addressed This Visit      Other   Low grade squamous intraepithelial lesion on cytologic smear of cervix (LGSIL) - Primary      Reviewed current ASCCP guidelines and recommended repeat PAP/HPV in 12/2020 unless more PAP/HPV more abnormal today. She will return to clinic at that time.    Alinda Dooms, NP  I personally interviewed and examined the patient. Agreed with the above/below plan of care. I have directly contributed to assessment and plan of care of this patient and educated and discussed with patient and family.  Leida Lauth, MD   CC:  PCP Sherlene Shams, MD [address] [place], [place] [address] [phone]

## 2020-01-12 LAB — IGP, APTIMA HPV: HPV Aptima: NEGATIVE

## 2020-01-13 ENCOUNTER — Telehealth: Payer: Self-pay

## 2020-01-13 NOTE — Telephone Encounter (Signed)
Attempted to call Dr. Metta Clines with her pap smear results. Voicemail is full. My chart message sent.

## 2020-01-20 ENCOUNTER — Encounter: Payer: Self-pay | Admitting: Nurse Practitioner

## 2020-02-01 ENCOUNTER — Inpatient Hospital Stay: Payer: 59 | Attending: Obstetrics and Gynecology | Admitting: Obstetrics and Gynecology

## 2020-02-01 ENCOUNTER — Other Ambulatory Visit: Payer: Self-pay

## 2020-02-01 VITALS — BP 126/81 | HR 76 | Temp 97.8°F | Resp 18 | Wt 140.6 lb

## 2020-02-01 DIAGNOSIS — R87612 Low grade squamous intraepithelial lesion on cytologic smear of cervix (LGSIL): Secondary | ICD-10-CM | POA: Diagnosis present

## 2020-02-01 DIAGNOSIS — R87618 Other abnormal cytological findings on specimens from cervix uteri: Secondary | ICD-10-CM

## 2020-02-01 NOTE — Patient Instructions (Addendum)

## 2020-02-01 NOTE — Progress Notes (Signed)
Gynecologic Oncology Visit   Referring Provider: Dr. Arvil Chaco  Chief Concern: abnormal Pap smears and VAIN1.  PAP with endometrial cells.   Subjective:  Maria Kemp is a 64 y.o. female who has a long history of slightly abnormal Pap smears, mainly LGSIL, often negative biopsies after colposcopy. 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 She returns to clinic today for endometrial biopsy.   Her most recent pap 1/27/21showed LSIL and HPV negative. However, endometrial cells were present and we discussed that while this can be benign or due to polyp, endometrial abnormalities including endometrial hyperplasia and/or carcinoma can't be ruled out and Dr. Johnnette Litter recommended endometrial biopsy to further evaluation.  She has no bleeding.    01/04/2020- Pap LSIL, HPV negative. Epithelial cell abnormality  Today, she denies complaints, specifically denying vaginal bleeding, discharge, and/or pain.   Gynecologic History:  Dr Arvil Chaco performed LEEP in 1999 and CKC in 2006 for cervical dysplasia.  Subsequent Pap smears vary between normal and LGSIL with high risk HPV.  Since 2013 her q6 month PAPs have shown ASCUS, Normal, LSIL, LSIL.  No HPV testing or colposcopy was done during that time period.  10/2014 she saw Dr. Johnnette Litter. Colposcopy was performed. The transformation zone was not visible, but there were no lesions. Pap NILM and HR-HPV negative.   She was seen by Dr Johnnette Litter 12/16 for colposcopy given LSIL cervical PAP, Vaginal Pap: ASCUS, negative HR HPV on 11/16. Biopsy of small cyst in the vagina just below the cervix was performed.  DIAGNOSIS:  A. VAGINA; BIOPSY:  - INFLAMED VAGINAL MUCOSA WITH A LOW GRADE SQUAMOUS INTRAEPITHELIAL LESION (VAIN 1) AND PARTIAL BENIGN CYST.   12/17- Pap/HPV normal  06/17/17- LSIL PAP/HPV Positive  03/17/2018 Colposcopy vagina and cervix, negative.  03/17/2018- Pap NILM, HRHPV negative; Colposcopy negative 05/25/2019- Pap LSIL, HR HPV negative.   She had an  episode of SVT on 06/09/2019 that responded to adenosine. She started maintenance metoprolol.   Problem List: Patient Active Problem List   Diagnosis Date Noted  . Bilateral cataracts 10/12/2018  . Cyst of joint of hand, right 10/12/2018  . Allergic conjunctivitis 03/02/2018  . Caregiver with fatigue 11/04/2017  . Breast nodule 03/22/2017  . Vaginal dysplasia 05/21/2016  . Low grade squamous intraepithelial lesion on cytologic smear of cervix (LGSIL) 05/21/2016  . History of colonic polyps 03/17/2016  . H/O sebaceous cyst 09/06/2015  . Hyperlipidemia LDL goal <130 09/06/2015  . Pruritus 03/05/2015  . Lichen planus 09/04/2014  . Essential hypertension, benign 09/04/2014  . Family history of colonic polyps 09/04/2014  . H/O rotator cuff tear 09/04/2014   Past Medical History: Past Medical History:  Diagnosis Date  . Arthritis    hands  . Colon polyps   . Hyperlipidemia   . Hypertension   . PONV (postoperative nausea and vomiting)    Past Surgical History: Past Surgical History:  Procedure Laterality Date  . ABDOMINOPLASTY  1995  . CATARACT EXTRACTION, BILATERAL    . CESAREAN SECTION  1989  . COLONOSCOPY WITH PROPOFOL N/A 11/29/2018   Procedure: COLONOSCOPY WITH PROPOFOL;  Surgeon: [name], MD;  Location: [facility];  Service: Endoscopy;  Laterality: N/A;  . POLYPECTOMY N/A 11/29/2018   Procedure: POLYPECTOMY;  Surgeon: [name], MD;  Location: [facility];  Service: Endoscopy;  Laterality: N/A;   Past Gynecologic History:  Menarche: 11 Menstrual details: postmenopausal  OB History: G3P2  Family History: Family History  Problem Relation Age of Onset  .  Arthritis Mother   . Hyperlipidemia Mother   . Hypertension Mother   . Heart disease Mother   . Arthritis Father   . Cancer Father   . Hyperlipidemia Father   . Hypertension Father   . Heart disease Father   . Diabetes Father   . Hyperlipidemia Brother   . Hypertension Brother     Social History: Social History   Socioeconomic History  . Marital status: Divorced    Spouse name: Not on file  . Number of children: Not on file  . Years of education: Not on file  . Highest education level: Not on file  Occupational History  . Not on file  Tobacco Use  . Smoking status: Never Smoker  . Smokeless tobacco: Never Used  Substance and Sexual Activity  . Alcohol use: Yes    Comment: rarely - 2-3x/yr  . Drug use: No  . Sexual activity: Not on file  Other Topics Concern  . Not on file  Social History Narrative  . Not on file   Social Determinants of Health   Financial Resource Strain:   . Difficulty of Paying Living Expenses: Not on file  Food Insecurity:   . Worried About Charity fundraiser in the Last Year: Not on file  . Ran Out of Food in the Last Year: Not on file  Transportation Needs:   . Lack of Transportation (Medical): Not on file  . Lack of Transportation (Non-Medical): Not on file  Physical Activity:   . Days of Exercise per Week: Not on file  . Minutes of Exercise per Session: Not on file  Stress:   . Feeling of Stress : Not on file  Social Connections:   . Frequency of Communication with Friends and Family: Not on file  . Frequency of Social Gatherings with Friends and Family: Not on file  . Attends Religious Services: Not on file  . Active Member of Clubs or Organizations: Not on file  . Attends Archivist Meetings: Not on file  . Marital Status: Not on file  Intimate Partner Violence:   . Fear of Current or Ex-Partner: Not on file  . Emotionally Abused: Not on file  . Physically Abused: Not on file  . Sexually Abused: Not on file   Allergies: Allergies  Allergen Reactions  . Codeine Anaphylaxis  . Shrimp [Shellfish Allergy] Anaphylaxis    All shell fish   Current Medications: Current Outpatient Medications  Medication Sig Dispense Refill  . amLODipine (NORVASC) 5 MG tablet TAKE ONE TABLET BY MOUTH EVERY DAY 90  tablet 1  . atorvastatin (LIPITOR) 10 MG tablet TAKE ONE TABLET BY MOUTH EVERY DAY 90 tablet 1  . Biotin (BIOTIN 5000) 5 MG CAPS Take 1 capsule by mouth daily.    . Calcium Carbonate-Vitamin D (CALCIUM-VITAMIN D) 500-200 MG-UNIT tablet Take 1 tablet by mouth daily.     . Cholecalciferol (D3-1000) 1000 UNITS tablet Take 1,000 Units by mouth daily.    Marland Kitchen EPINEPHRINE 0.3 mg/0.3 mL IJ SOAJ injection USE AS DIRECTED 2 each 0  . fexofenadine (ALLEGRA) 60 MG tablet Take 60 mg by mouth 2 (two) times daily.    . fluticasone (FLONASE) 50 MCG/ACT nasal spray Place 1 spray into both nostrils daily.    . Misc. Devices (ALL-BODY MASSAGE) MISC by Does not apply route.    . Multiple Vitamin (MULTIVITAMIN) tablet Take by mouth.     No current facility-administered medications for this visit.    Objective:  Physical Examination:  There were no vitals filed for this visit. There is no height or weight on file to calculate BMI.  GENERAL: Patient is a well appearing female in no acute distress HEENT:  Sclera clear. Anicteric NODES:  Negative axillary, supraclavicular, inguinal lymph node survery LUNGS:  Clear to auscultation bilaterally.   HEART:  Regular rate and rhythm.  ABDOMEN:  Soft, nontender.  No hernias, incisions well healed. No masses or ascites SKIN:  Clear with no obvious rashes or skin changes.  NEURO:  Nonfocal. Well oriented.  Appropriate affect.  Pelvic:  EGBUS: atrophic Vagina: atrophic Cervix: flush with vault, no lesions Uterus: normal Bimanual: no masses or tenderness  Procedure note:  Informed consent obtained and form signed and patient identified.  Small amount of betadine placed on cervix with stick swab.  Patient said she is allergic and so this was irrigated out with saline. Anterior cervix grasped with tenaculum and os was stenotic so dilated with narrow sound and os finder.  Pipelle placed to 5 cm and small amount of blood and tissue obtained.  She had moderate discomfort due  to dilation of cervical stenosis.  She was observed for 30 minutes after procedure and had no symptoms.         Assessment:  Maria Kemp is a 64 y.o. 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 Cervical os stenosis. LSIL Pap smear with positive HPV 06/17/2017. Negative colposcopy with negative Pap/HRHPV 4/19 and 6/20.   PAP 1/21 showed LSIL with negative HPV.  Endometrial cells noted on PAP and this is abnormal in a post menopausal woman.  So returns today for endometrial biopsy.   Plan:   Problem List Items Addressed This Visit    None    Visit Diagnoses    Unexplained endometrial cells on cervical Pap smear    -  Primary   Relevant Orders   Surgical pathology   US PELVIC COMPLETE WITH TRANSVAGINAL     Will await results of endometrial biopsy and also order pelvis US to rule out polyps, endometrial thickening or other abnormalities.  If abnormal [place] or biopsy will consider need for hysteroscopy if polyps seen or hysterectomy if EIN or cancer found, but this is unlikely.    Patient has shellfish allergy and carries epipen.  She stopped using betadine years ago because of concern for allergy after experiencing considerable skin irritation.  Small amount of betadine used to clear cervix today as this was not on her allergy list.  This was irrigated out when she expressed concern that she might be allergic.  My reading today indicates that there is no connection between shellfish allergy and betadine allergy, and neither are thought to be due to iodine allergy presently.  Patient was observed and remained asymptomatic and was instructed to call if any vulvo/vaginal irritation developed.         Reviewed current ASCCP guidelines and recommended repeat PAP/HPV in 12/2020.    Alinda Dooms, NP  I personally interviewed and examined the patient. Agreed with the above/below plan of care. I have directly contributed to assessment and plan  of care of this patient and educated and discussed with patient and family.  Leida Lauth, MD   CC:  PCP Sherlene Shams, MD [address] [place], [place] [address] [phone]

## 2020-02-06 ENCOUNTER — Encounter: Payer: Self-pay | Admitting: Nurse Practitioner

## 2020-02-09 ENCOUNTER — Other Ambulatory Visit: Payer: Self-pay

## 2020-02-09 ENCOUNTER — Ambulatory Visit
Admission: RE | Admit: 2020-02-09 | Discharge: 2020-02-09 | Disposition: A | Discharge status: Home / Self Care | Payer: 59 | Source: Ambulatory Visit | Attending: Nurse Practitioner | Admitting: Nurse Practitioner

## 2020-02-09 DIAGNOSIS — R87618 Other abnormal cytological findings on specimens from cervix uteri: Secondary | ICD-10-CM | POA: Diagnosis not present

## 2020-03-10 ENCOUNTER — Other Ambulatory Visit: Payer: Self-pay | Admitting: Internal Medicine

## 2020-03-12 ENCOUNTER — Ambulatory Visit: Payer: 59 | Admitting: Psychology

## 2020-05-23 ENCOUNTER — Ambulatory Visit: Payer: 59

## 2020-07-03 ENCOUNTER — Encounter: Payer: Self-pay | Admitting: Internal Medicine

## 2020-08-01 ENCOUNTER — Ambulatory Visit (INDEPENDENT_AMBULATORY_CARE_PROVIDER_SITE_OTHER): Payer: 59 | Admitting: Internal Medicine

## 2020-08-01 ENCOUNTER — Other Ambulatory Visit: Payer: Self-pay

## 2020-08-01 ENCOUNTER — Encounter: Payer: Self-pay | Admitting: Internal Medicine

## 2020-08-01 VITALS — BP 108/72 | HR 82 | Temp 98.5°F | Resp 14 | Ht 63.0 in | Wt 139.2 lb

## 2020-08-01 DIAGNOSIS — R5383 Other fatigue: Secondary | ICD-10-CM | POA: Diagnosis not present

## 2020-08-01 DIAGNOSIS — S4991XA Unspecified injury of right shoulder and upper arm, initial encounter: Secondary | ICD-10-CM

## 2020-08-01 DIAGNOSIS — I10 Essential (primary) hypertension: Secondary | ICD-10-CM | POA: Diagnosis not present

## 2020-08-01 DIAGNOSIS — R7303 Prediabetes: Secondary | ICD-10-CM | POA: Diagnosis not present

## 2020-08-01 DIAGNOSIS — Z0001 Encounter for general adult medical examination with abnormal findings: Secondary | ICD-10-CM

## 2020-08-01 DIAGNOSIS — E785 Hyperlipidemia, unspecified: Secondary | ICD-10-CM | POA: Diagnosis not present

## 2020-08-01 DIAGNOSIS — Z0000 Encounter for general adult medical examination without abnormal findings: Secondary | ICD-10-CM

## 2020-08-01 NOTE — Progress Notes (Signed)
Patient ID: Maria Kemp, female    DOB: [birth year]  Age: 64 y.o. MRN: [id number]  The patient is here for annual  wellness examination and management of other chronic and acute problems.   The risk factors are reflected in the social history.  The roster of all physicians providing medical care to patient - is listed in the Snapshot section of the chart.  Activities of daily living:  The patient is 100% independent in all ADLs: dressing, toileting, feeding as well as independent mobility  Home safety : The patient has smoke detectors in the home. They wear seatbelts.  There are no firearms at home. There is no violence in the home.   There is no risks for hepatitis, STDs or HIV. There is no   history of blood transfusion. They have no travel history to infectious disease endemic areas of the world.  The patient has seen their dentist in the last six month. They have seen their eye doctor in the last year. They admit to slight hearing difficulty with regard to whispered voices and some television programs.  They have deferred audiologic testing in the last year.  They do not  have excessive sun exposure. Discussed the need for sun protection: hats, long sleeves and use of sunscreen if there is significant sun exposure.   Diet: the importance of a healthy diet is discussed. They do have a healthy diet.  The benefits of regular aerobic exercise were discussed. She walks 4 times per week ,  20 minutes.   Depression screen: there are no signs or vegative symptoms of depression- irritability, change in appetite, anhedonia, sadness/tearfullness.  Cognitive assessment: the patient manages all their financial and personal affairs and is actively engaged. They could relate day,date,year and events; recalled 2/3 objects at 3 minutes; performed clock-face test normally.  The following portions of the patient's history were reviewed and updated as appropriate: allergies, current medications, past family  history, past medical history,  past surgical history, past social history  and problem list.  Visual acuity was not assessed per patient preference since she has regular follow up with her ophthalmologist. Hearing and body mass index were assessed and reviewed.   During the course of the visit the patient was educated and counseled about appropriate screening and preventive services including : fall prevention , diabetes screening, nutrition counseling, colorectal cancer screening, and recommended immunizations.    CC: The primary encounter diagnosis was Encounter for preventive health examination. Diagnoses of Hyperlipidemia LDL goal <130, Essential hypertension, benign, Prediabetes, Other fatigue, Caregiver with fatigue, and Right shoulder injury, initial encounter were also pertinent to this visit.  1)  Stress:  Family matters.  Father 95 on started dialysis for renal failure.  Mother has FTD and resists a lot of things Dnasia tries to do.  Parents live with her . In bed by 8:30-9:00 wakes up by 5:45.  Would like to see a therapist.  (NOT  JP)    2) linear calcifications on mammogram , awaiting callback from [facility]   3) right shoulder pain for 4 months post fall onto face rotator cuff injury suspected.   Does not want PT referral at this time   4) getting Medicare care next week     History Mrytle has a past medical history of Arthritis, Colon polyps, Hyperlipidemia, Hypertension, and PONV (postoperative nausea and vomiting).   She has a past surgical history that includes Abdominoplasty (1995); Cesarean section (1989); Colonoscopy with propofol (N/A, 11/29/2018); polypectomy (N/A, 11/29/2018);  and Cataract extraction, bilateral.   Her family history includes Arthritis in her father and mother; Cancer in her father; Diabetes in her father; Heart disease in her father and mother; Hyperlipidemia in her brother, father, and mother; Hypertension in her brother, father, and mother.She reports  that she has never smoked. She has never used smokeless tobacco. She reports current alcohol use. She reports that she does not use drugs.  Outpatient Medications Prior to Visit  Medication Sig Dispense Refill  . amLODipine (NORVASC) 5 MG tablet TAKE ONE TABLET EVERY DAY 90 tablet 1  . atorvastatin (LIPITOR) 10 MG tablet TAKE ONE TABLET EVERY DAY 90 tablet 1  . Biotin (BIOTIN 5000) 5 MG CAPS Take 1 capsule by mouth daily.    . Calcium Carbonate-Vitamin D (CALCIUM-VITAMIN D) 500-200 MG-UNIT tablet Take 1 tablet by mouth daily.     . Cholecalciferol (D3-1000) 1000 UNITS tablet Take 1,000 Units by mouth daily.    Marland Kitchen EPINEPHRINE 0.3 mg/0.3 mL IJ SOAJ injection USE AS DIRECTED 2 each 0  . fexofenadine (ALLEGRA) 60 MG tablet Take 60 mg by mouth 2 (two) times daily.    . fluticasone (FLONASE) 50 MCG/ACT nasal spray Place 1 spray into both nostrils daily.    . Misc. Devices (ALL-BODY MASSAGE) MISC by Does not apply route.    . Multiple Vitamin (MULTIVITAMIN) tablet Take by mouth.     No facility-administered medications prior to visit.    Review of Systems   Patient denies headache, fevers, malaise, unintentional weight loss, skin rash, eye pain, sinus congestion and sinus pain, sore throat, dysphagia,  hemoptysis , cough, dyspnea, wheezing, chest pain, palpitations, orthopnea, edema, abdominal pain, nausea, melena, diarrhea, constipation, flank pain, dysuria, hematuria, urinary  Frequency, nocturia, numbness, tingling, seizures,  Focal weakness, Loss of consciousness,  Tremor, insomnia, depression, anxiety, and suicidal ideation.      Objective:  BP 108/72 (BP Location: Left Arm, Patient Position: Sitting, Cuff Size: Normal)   Pulse 82   Temp 98.5 F (36.9 C) (Oral)   Resp 14   Ht 5\' 3"  (1.6 m)   Wt 139 lb 3.2 oz (63.1 kg)   SpO2 96%   BMI 24.66 kg/m   Physical Exam  General appearance: alert, cooperative and appears stated age Head: Normocephalic, without obvious abnormality,  atraumatic Eyes: conjunctivae/corneas clear. PERRL, EOM's intact. Fundi benign. Ears: normal TM's and external ear canals both ears Nose: Nares normal. Septum midline. Mucosa normal. No drainage or sinus tenderness. Throat: lips, mucosa, and tongue normal; teeth and gums normal Neck: no adenopathy, no carotid bruit, no JVD, supple, symmetrical, trachea midline and thyroid not enlarged, symmetric, no tenderness/mass/nodules MSK:  Right shoulder ROM limmited with forced extension and adduction by pain.  Full ROM passively  Lungs: clear to auscultation bilaterally Breasts: normal appearance, no masses or tenderness Heart: regular rate and rhythm, S1, S2 normal, no murmur, click, rub or gallop Abdomen: soft, non-tender; bowel sounds normal; no masses,  no organomegaly Extremities: extremities normal, atraumatic, no cyanosis or edema Pulses: 2+ and symmetric Skin: Skin color, texture, turgor normal. No rashes or lesions Neurologic: Alert and oriented X 3, normal strength and tone. Normal symmetric reflexes. Normal coordination and gait.    Assessment & Plan:   Problem List Items Addressed This Visit      Unprioritized   Caregiver with fatigue    A total of 40 minutes of face to face time was spent with patient more than half of which was spent in counselling  Encounter for preventive health examination - Primary    age appropriate education and counseling updated, referrals for preventative services and immunizations addressed, dietary and smoking counseling addressed, most recent labs reviewed.  I have personally reviewed and have noted:  1) the patient's medical and social history 2) The pt's use of alcohol, tobacco, and illicit drugs 3) The patient's current medications and supplements 4) Functional ability including ADL's, fall risk, home safety risk, hearing and visual impairment 5) Diet and physical activities 6) Evidence for depression or mood disorder 7) The patient's  height, weight, and BMI have been recorded in the chart  I have made referrals, and provided counseling and education based on review of the above      Essential hypertension, benign    Well controlled on current regimen. Renal function stable, no changes today.      Hyperlipidemia LDL goal <130    Managed with 10 mg atorvastatin  (Caduet) . She has no side effects and will return for fasting labs and liver enzymes . No changes today. Nonfasting LDl is 90  Lab Results  Component Value Date   CHOL 163 05/11/2019   HDL 57.50 05/11/2019   LDLCALC 89 05/11/2019   LDLDIRECT 90.0 03/05/2016   TRIG 79.0 05/11/2019   CHOLHDL 3 05/11/2019         Relevant Orders   TSH   Right shoulder injury, initial encounter    exam consistent with rotator cuff muscle injury        Other Visit Diagnoses    Prediabetes       Relevant Orders   Comprehensive metabolic panel   Hemoglobin A1c   Other fatigue       Relevant Orders   CBC with Differential/Platelet      I am having Tiffany Kocher maintain her Cholecalciferol, calcium-vitamin D, multivitamin, Biotin, All-Body Massage, fluticasone, EPINEPHrine, fexofenadine, atorvastatin, and amLODipine.  No orders of the defined types were placed in this encounter.   There are no discontinued medications.  Follow-up: No follow-ups on file.   Sherlene Shams, MD

## 2020-08-01 NOTE — Patient Instructions (Signed)
If you get a supplement to your Medicare,  The PT I recommend is:  Cristy Hilts, PT, DPT [facility]   Ph [phone]   Fax [phone]   Return for labs after suspending biotin for 3 days     Health Maintenance for Postmenopausal Women Menopause is a normal process in which your ability to get pregnant comes to an end. This process happens slowly over many months or years, usually between the ages of 43 and 76. Menopause is complete when you have missed your menstrual periods for 12 months. It is important to talk with your health care provider about some of the most common conditions that affect women after menopause (postmenopausal women). These include heart disease, cancer, and bone loss (osteoporosis). Adopting a healthy lifestyle and getting preventive care can help to promote your health and wellness. The actions you take can also lower your chances of developing some of these common conditions. What should I know about menopause? During menopause, you may get a number of symptoms, such as:  Hot flashes. These can be moderate or severe.  Night sweats.  Decrease in sex drive.  Mood swings.  Headaches.  Tiredness.  Irritability.  Memory problems.  Insomnia. Choosing to treat or not to treat these symptoms is a decision that you make with your health care provider. Do I need hormone replacement therapy?  Hormone replacement therapy is effective in treating symptoms that are caused by menopause, such as hot flashes and night sweats.  Hormone replacement carries certain risks, especially as you become older. If you are thinking about using estrogen or estrogen with progestin, discuss the benefits and risks with your health care provider. What is my risk for heart disease and stroke? The risk of heart disease, heart attack, and stroke increases as you age. One of the causes may be a change in the body's hormones during menopause. This can affect how your body uses  dietary fats, triglycerides, and cholesterol. Heart attack and stroke are medical emergencies. There are many things that you can do to help prevent heart disease and stroke. Watch your blood pressure  High blood pressure causes heart disease and increases the risk of stroke. This is more likely to develop in people who have high blood pressure readings, are of African descent, or are overweight.  Have your blood pressure checked: ? Every 3-5 years if you are 48-58 years of age. ? Every year if you are 67 years old or older. Eat a healthy diet   Eat a diet that includes plenty of vegetables, fruits, low-fat dairy products, and lean protein.  Do not eat a lot of foods that are high in solid fats, added sugars, or sodium. Get regular exercise Get regular exercise. This is one of the most important things you can do for your health. Most adults should:  Try to exercise for at least 150 minutes each week. The exercise should increase your heart rate and make you sweat (moderate-intensity exercise).  Try to do strengthening exercises at least twice each week. Do these in addition to the moderate-intensity exercise.  Spend less time sitting. Even light physical activity can be beneficial. Other tips  Work with your health care provider to achieve or maintain a healthy weight.  Do not use any products that contain nicotine or tobacco, such as cigarettes, e-cigarettes, and chewing tobacco. If you need help quitting, ask your health care provider.  Know your numbers. Ask your health care provider to check your  cholesterol and your blood sugar (glucose). Continue to have your blood tested as directed by your health care provider. Do I need screening for cancer? Depending on your health history and family history, you may need to have cancer screening at different stages of your life. This may include screening for:  Breast cancer.  Cervical cancer.  Lung cancer.  Colorectal cancer. What  is my risk for osteoporosis? After menopause, you may be at increased risk for osteoporosis. Osteoporosis is a condition in which bone destruction happens more quickly than new bone creation. To help prevent osteoporosis or the bone fractures that can happen because of osteoporosis, you may take the following actions:  If you are 31-57 years old, get at least 1,000 mg of calcium and at least 600 mg of vitamin D per day.  If you are older than age 24 but younger than age 34, get at least 1,200 mg of calcium and at least 600 mg of vitamin D per day.  If you are older than age 101, get at least 1,200 mg of calcium and at least 800 mg of vitamin D per day. Smoking and drinking excessive alcohol increase the risk of osteoporosis. Eat foods that are rich in calcium and vitamin D, and do weight-bearing exercises several times each week as directed by your health care provider. How does menopause affect my mental health? Depression may occur at any age, but it is more common as you become older. Common symptoms of depression include:  Low or sad mood.  Changes in sleep patterns.  Changes in appetite or eating patterns.  Feeling an overall lack of motivation or enjoyment of activities that you previously enjoyed.  Frequent crying spells. Talk with your health care provider if you think that you are experiencing depression. General instructions See your health care provider for regular wellness exams and vaccines. This may include:  Scheduling regular health, dental, and eye exams.  Getting and maintaining your vaccines. These include: ? Influenza vaccine. Get this vaccine each year before the flu season begins. ? Pneumonia vaccine. ? Shingles vaccine. ? Tetanus, diphtheria, and pertussis (Tdap) booster vaccine. Your health care provider may also recommend other immunizations. Tell your health care provider if you have ever been abused or do not feel safe at home. Summary  Menopause is a  normal process in which your ability to get pregnant comes to an end.  This condition causes hot flashes, night sweats, decreased interest in sex, mood swings, headaches, or lack of sleep.  Treatment for this condition may include hormone replacement therapy.  Take actions to keep yourself healthy, including exercising regularly, eating a healthy diet, watching your weight, and checking your blood pressure and blood sugar levels.  Get screened for cancer and depression. Make sure that you are up to date with all your vaccines. This information is not intended to replace advice given to you by your health care provider. Make sure you discuss any questions you have with your health care provider. Document Revised: 11/17/2018 Document Reviewed: 11/17/2018 Elsevier Patient Education  2020 [employer].

## 2020-08-02 DIAGNOSIS — Z0000 Encounter for general adult medical examination without abnormal findings: Secondary | ICD-10-CM | POA: Insufficient documentation

## 2020-08-02 DIAGNOSIS — S4991XA Unspecified injury of right shoulder and upper arm, initial encounter: Secondary | ICD-10-CM | POA: Insufficient documentation

## 2020-08-02 NOTE — Assessment & Plan Note (Signed)
Well controlled on current regimen. Renal function stable, no changes today. 

## 2020-08-02 NOTE — Assessment & Plan Note (Signed)
Managed with 10 mg atorvastatin  (Caduet) . She has no side effects and will return for fasting labs and liver enzymes . No changes today. Nonfasting LDl is 90  Lab Results  Component Value Date   CHOL 163 05/11/2019   HDL 57.50 05/11/2019   LDLCALC 89 05/11/2019   LDLDIRECT 90.0 03/05/2016   TRIG 79.0 05/11/2019   CHOLHDL 3 05/11/2019

## 2020-08-02 NOTE — Assessment & Plan Note (Signed)
A total of 40 minutes of face to face time was spent with patient more than half of which was spent in counselling

## 2020-08-02 NOTE — Assessment & Plan Note (Signed)
exam consistent with rotator cuff muscle injury

## 2020-08-02 NOTE — Assessment & Plan Note (Signed)

## 2020-08-06 ENCOUNTER — Other Ambulatory Visit (INDEPENDENT_AMBULATORY_CARE_PROVIDER_SITE_OTHER): Payer: 59

## 2020-08-06 ENCOUNTER — Other Ambulatory Visit: Payer: Self-pay

## 2020-08-06 ENCOUNTER — Other Ambulatory Visit: Payer: 59

## 2020-08-06 DIAGNOSIS — R7303 Prediabetes: Secondary | ICD-10-CM

## 2020-08-06 DIAGNOSIS — E785 Hyperlipidemia, unspecified: Secondary | ICD-10-CM

## 2020-08-06 DIAGNOSIS — R5383 Other fatigue: Secondary | ICD-10-CM

## 2020-08-07 LAB — CBC WITH DIFFERENTIAL/PLATELET
Basophils Absolute: 0 10*3/uL (ref 0.0–0.1)
Basophils Relative: 1 % (ref 0.0–3.0)
Eosinophils Absolute: 0 10*3/uL (ref 0.0–0.7)
Eosinophils Relative: 0.7 % (ref 0.0–5.0)
HCT: 39.9 % (ref 36.0–46.0)
Hemoglobin: 13.4 g/dL (ref 12.0–15.0)
Lymphocytes Relative: 42.6 % (ref 12.0–46.0)
Lymphs Abs: 1.5 10*3/uL (ref 0.7–4.0)
MCHC: 33.7 g/dL (ref 30.0–36.0)
MCV: 91.1 fl (ref 78.0–100.0)
Monocytes Absolute: 0.5 10*3/uL (ref 0.1–1.0)
Monocytes Relative: 13.4 % — ABNORMAL HIGH (ref 3.0–12.0)
Neutro Abs: 1.4 10*3/uL (ref 1.4–7.7)
Neutrophils Relative %: 42.3 % — ABNORMAL LOW (ref 43.0–77.0)
Platelets: 354 10*3/uL (ref 150.0–400.0)
RBC: 4.38 Mil/uL (ref 3.87–5.11)
RDW: 13.8 % (ref 11.5–15.5)
WBC: 3.4 10*3/uL — ABNORMAL LOW (ref 4.0–10.5)

## 2020-08-07 LAB — COMPREHENSIVE METABOLIC PANEL
ALT: 18 U/L (ref 0–35)
AST: 19 U/L (ref 0–37)
Albumin: 4.6 g/dL (ref 3.5–5.2)
Alkaline Phosphatase: 78 U/L (ref 39–117)
BUN: 15 mg/dL (ref 6–23)
CO2: 30 mEq/L (ref 19–32)
Calcium: 9.5 mg/dL (ref 8.4–10.5)
Chloride: 100 mEq/L (ref 96–112)
Creatinine, Ser: 0.76 mg/dL (ref 0.40–1.20)
GFR: 92.42 mL/min (ref >60.00)
Glucose, Bld: 83 mg/dL (ref 70–99)
Potassium: 4 mEq/L (ref 3.5–5.1)
Sodium: 138 mEq/L (ref 135–145)
Total Bilirubin: 0.4 mg/dL (ref 0.2–1.2)
Total Protein: 7 g/dL (ref 6.0–8.3)

## 2020-08-07 LAB — HEMOGLOBIN A1C: Hgb A1c MFr Bld: 6.2 % (ref 4.6–6.5)

## 2020-08-07 LAB — TSH: TSH: 0.78 u[IU]/mL (ref 0.35–4.50)

## 2020-08-09 ENCOUNTER — Other Ambulatory Visit: Payer: Self-pay | Admitting: Internal Medicine

## 2020-08-14 ENCOUNTER — Other Ambulatory Visit: Payer: Self-pay | Admitting: Orthopedic Surgery

## 2020-08-14 DIAGNOSIS — R2231 Localized swelling, mass and lump, right upper limb: Secondary | ICD-10-CM

## 2020-09-03 ENCOUNTER — Ambulatory Visit: Payer: 59

## 2020-09-03 ENCOUNTER — Ambulatory Visit
Admission: RE | Admit: 2020-09-03 | Discharge: 2020-09-03 | Disposition: A | Discharge status: Home / Self Care | Payer: 59 | Source: Ambulatory Visit | Attending: Orthopedic Surgery | Admitting: Orthopedic Surgery

## 2020-09-03 ENCOUNTER — Other Ambulatory Visit: Payer: Self-pay

## 2020-09-03 DIAGNOSIS — M19041 Primary osteoarthritis, right hand: Secondary | ICD-10-CM | POA: Diagnosis not present

## 2020-09-03 DIAGNOSIS — R2231 Localized swelling, mass and lump, right upper limb: Secondary | ICD-10-CM | POA: Insufficient documentation

## 2020-10-09 ENCOUNTER — Other Ambulatory Visit: Payer: Self-pay | Admitting: Internal Medicine

## 2020-12-26 ENCOUNTER — Ambulatory Visit: Payer: 59

## 2021-01-02 ENCOUNTER — Ambulatory Visit: Payer: 59

## 2021-01-16 ENCOUNTER — Inpatient Hospital Stay: Payer: Medicare Other | Attending: Obstetrics and Gynecology | Admitting: Obstetrics and Gynecology

## 2021-01-16 ENCOUNTER — Encounter: Payer: Self-pay | Admitting: Obstetrics and Gynecology

## 2021-01-16 VITALS — BP 134/74 | HR 75 | Temp 97.4°F | Wt 139.3 lb

## 2021-01-16 DIAGNOSIS — R87612 Low grade squamous intraepithelial lesion on cytologic smear of cervix (LGSIL): Secondary | ICD-10-CM | POA: Diagnosis present

## 2021-01-16 NOTE — Progress Notes (Signed)
Gynecologic Oncology Visit   Referring Provider: Dr. Arvil Chaco  PCP Sherlene Shams, MD [address] [place], [place] [address] [phone]  Chief Concern: abnormal Pap smears and VAIN1.  PAP with endometrial cells.   Subjective:  Maria Kemp is a 65 y.o. female who has a long history of slightly abnormal Pap smears, mainly LGSIL, often negative biopsies after colposcopy. 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 She returns to clinic today for Pap/HPV testing.   At her last visit she had an endometrial biopsy on 02/01/2020 pathology revealed tissue insufficient for diagnosis.   Today, she denies gynecologic complaints. Her mother and father passed away. Her mother was diagnosed with carcinosarcoma.   Gynecologic History:  Dr Arvil Chaco performed LEEP in 1999 and CKC in 2006 for cervical dysplasia.  Subsequent Pap smears vary between normal and LGSIL with high risk HPV.  Since 2013 her q6 month PAPs have shown ASCUS, Normal, LSIL, LSIL.  No HPV testing or colposcopy was done during that time period.  10/2014 she saw Dr. Johnnette Litter. Colposcopy was performed. The transformation zone was not visible, but there were no lesions. Pap NILM and HR-HPV negative.   She was seen by Dr Johnnette Litter 12/16 for colposcopy given LSIL cervical PAP, Vaginal Pap: ASCUS, negative HR HPV on 11/16. Biopsy of small cyst in the vagina just below the cervix was performed.  DIAGNOSIS:  A. VAGINA; BIOPSY:  - INFLAMED VAGINAL MUCOSA WITH A LOW GRADE SQUAMOUS INTRAEPITHELIAL LESION (VAIN 1) AND PARTIAL BENIGN CYST.   12/17- Pap/HPV normal  06/17/17- LSIL PAP/HPV Positive  03/17/2018 Colposcopy vagina and cervix, negative.  03/17/2018- Pap NILM, HRHPV negative; Colposcopy negative 05/25/2019- Pap LSIL, HR HPV negative.   02/09/2020 pelvic US was obtained for unexplained endometrial cells on Pap smear.  IMPRESSION: 1. Endometrial stripe measures within normal limits at 2 mm in maximal thickness. 2. 5 mm simple  cystic lesion seen close proximity to the proximal endometrial stripe, indeterminate, but could reflect a small subendometrial cyst versus small amount of trapped simple fluid within the endometrial cavity. 3. Normal right ovary. 4. Nonvisualization of the left ovary. No adnexal mass or free fluid.  01/04/2020- Pap LSIL, HPV negative. Epithelial cell abnormality Her most recent pap 1/27/21showed LSIL and HPV negative. However, endometrial cells were present and we discussed that while this can be benign or due to polyp, endometrial abnormalities including endometrial hyperplasia and/or carcinoma can't be ruled out and Dr. Johnnette Litter recommended endometrial biopsy to further evaluation.    She had an episode of SVT on 06/09/2019 that responded to adenosine. She started maintenance metoprolol.   Problem List: Patient Active Problem List   Diagnosis Date Noted  . Encounter for preventive health examination 08/02/2020  . Right shoulder injury, initial encounter 08/02/2020  . Bilateral cataracts 10/12/2018  . Cyst of joint of hand, right 10/12/2018  . Allergic conjunctivitis 03/02/2018  . Caregiver with fatigue 11/04/2017  . Breast nodule 03/22/2017  . Vaginal dysplasia 05/21/2016  . Low grade squamous intraepithelial lesion on cytologic smear of cervix (LGSIL) 05/21/2016  . History of colonic polyps 03/17/2016  . H/O sebaceous cyst 09/06/2015  . Hyperlipidemia LDL goal <130 09/06/2015  . Pruritus 03/05/2015  . Lichen planus 09/04/2014  . Essential hypertension, benign 09/04/2014  . Family history of colonic polyps 09/04/2014  . H/O rotator cuff tear 09/04/2014   Past Medical History: Past Medical History:  Diagnosis Date  . Arthritis    hands  . Colon polyps   .  Hyperlipidemia   . Hypertension   . PONV (postoperative nausea and vomiting)    Past Surgical History: Past Surgical History:  Procedure Laterality Date  . ABDOMINOPLASTY  1995  . CATARACT EXTRACTION, BILATERAL    .  CESAREAN SECTION  1989  . COLONOSCOPY WITH PROPOFOL N/A 11/29/2018   Procedure: COLONOSCOPY WITH PROPOFOL;  Surgeon: [name], MD;  Location: [facility];  Service: Endoscopy;  Laterality: N/A;  . POLYPECTOMY N/A 11/29/2018   Procedure: POLYPECTOMY;  Surgeon: [name], MD;  Location: [facility];  Service: Endoscopy;  Laterality: N/A;   Past Gynecologic History:  Menarche: 11 Menstrual details: postmenopausal  OB History: G3P2  Family History: Family History  Problem Relation Age of Onset  . Arthritis Mother   . Hyperlipidemia Mother   . Hypertension Mother   . Heart disease Mother   . Uterine cancer Mother   . Arthritis Father   . Cancer Father   . Hyperlipidemia Father   . Hypertension Father   . Heart disease Father   . Diabetes Father   . Hyperlipidemia Brother   . Hypertension Brother    Social History: Social History   Socioeconomic History  . Marital status: Divorced    Spouse name: Not on file  . Number of children: Not on file  . Years of education: Not on file  . Highest education level: Not on file  Occupational History  . Not on file  Tobacco Use  . Smoking status: Never Smoker  . Smokeless tobacco: Never Used  Vaping Use  . Vaping Use: Never used  Substance and Sexual Activity  . Alcohol use: Yes    Comment: rarely - 2-3x/yr  . Drug use: No  . Sexual activity: Not on file  Other Topics Concern  . Not on file  Social History Narrative  . Not on file   Social Determinants of Health   Financial Resource Strain: Not on file  Food Insecurity: Not on file  Transportation Needs: Not on file  Physical Activity: Not on file  Stress: Not on file  Social Connections: Not on file  Intimate Partner Violence: Not on file   Allergies: Allergies  Allergen Reactions  . Codeine Anaphylaxis  . Shrimp [Shellfish Allergy] Anaphylaxis    All shell fish   Current Medications: Current Outpatient Medications  Medication Sig  Dispense Refill  . amLODipine (NORVASC) 5 MG tablet TAKE ONE TABLET EVERY DAY 90 tablet 1  . atorvastatin (LIPITOR) 10 MG tablet TAKE ONE TABLET EVERY DAY 90 tablet 1  . Biotin 5 MG CAPS Take 1 capsule by mouth daily.    . Calcium Carbonate-Vitamin D (CALCIUM-VITAMIN D) 500-200 MG-UNIT tablet Take 1 tablet by mouth daily.     . Cholecalciferol 25 MCG (1000 UT) tablet Take 1,000 Units by mouth daily.    Marland Kitchen EPINEPHRINE 0.3 mg/0.3 mL IJ SOAJ injection USE AS DIRECTED 2 each 0  . Misc. Devices (ALL-BODY MASSAGE) MISC by Does not apply route.    . Multiple Vitamin (MULTIVITAMIN) tablet Take by mouth.    . fexofenadine (ALLEGRA) 60 MG tablet Take 60 mg by mouth 2 (two) times daily. (Patient not taking: Reported on 01/16/2021)    . fluticasone (FLONASE) 50 MCG/ACT nasal spray Place 1 spray into both nostrils daily. (Patient not taking: Reported on 01/16/2021)    . triamcinolone (NASACORT) 55 MCG/ACT AERO nasal inhaler Place into the nose.     No current facility-administered medications for this visit.   General: no complaints  HEENT: no complaints  Lungs: no complaints  Cardiac: no complaints  GI: diarrhea o/w no n/v or constipation  GU: no complaints  Musculoskeletal: back pain  Extremities: no complaints  Skin: no complaints  Neuro: no complaints  Endocrine: no complaints  Psych: no complaints       Objective:  Physical Examination:  Today's Vitals   01/16/21 1545  BP: 134/74  Pulse: 75  Temp: (!) 97.4 F (36.3 C)  TempSrc: Tympanic  SpO2: 99%  Weight: 139 lb 4.8 oz (63.2 kg)   Body mass index is 24.68 kg/m.  GENERAL: Patient is a well appearing female in no acute distress HEENT:  normocephalic  ABDOMEN:  Soft, nontender, nondistended. No masses or ascites or hernias.  EXTREMITIES:  No peripheral edema.   NEURO:  Nonfocal. Well oriented.  Appropriate affect.  Pelvic: chaperoned by CMA EGBUS: no lesions Cervix: no lesions, nontender, mobile Vagina:atrophic o/w no lesions,  no discharge or bleeding Uterus: normal size, nontender, mobile Adnexa: no palpable masses Rectovaginal: deferred            Assessment:  LOYALTY ARENTZ is a 65 y.o. 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 Cervical os stenosis. LSIL Pap smear with positive HPV 06/17/2017. Negative colposcopy with negative Pap/HRHPV 4/19 and 6/20. 01/04/2020- Pap LSIL, HPV negative. Epithelial cell abnormality. Endometrial cells noted on PAP and this is abnormal in a post menopausal woman.  Endometrial biopsy, insufficient tissue.   Family history of carcinosarcoma, mother in her 10's   Plan:   Problem List Items Addressed This Visit      Other   Low grade squamous intraepithelial lesion on cytologic smear of cervix (LGSIL) - Primary   Relevant Orders   IGP, Aptima HPV     PAP/HPV repeated today. We will follow up results and determine next steps.    Patient has shellfish allergy and carries epipen.  She stopped using betadine years ago because of concern for allergy after experiencing considerable skin irritation.  Small amount of betadine used to clear cervix today as this was not on her allergy list.  This was irrigated out when she expressed concern that she might be allergic. Dr. Johnnette Litter assessed this issue and on his ready there appear to be no connection between shellfish allergy and betadine allergy, and neither are thought to be due to iodine allergy presently.    We reviewed that her mother's cancer is most likely not genetic in origin given her age. We discussed that endometrial cancers are the most common type of gyn malignancy and carcinosarcomas are very rare. This type of cancer is not related to her abnormal Pap smear history.   I personally interviewed and examined the patient. Agreed with the above/below plan of care. I have directly contributed to assessment and plan of care of this patient and educated and discussed with  patient and family.   Verna Desrocher Leta Jungling, MD

## 2021-01-20 LAB — IGP, APTIMA HPV: HPV Aptima: NEGATIVE

## 2021-01-21 ENCOUNTER — Encounter: Payer: Self-pay | Admitting: Internal Medicine

## 2021-01-21 LAB — HM MAMMOGRAPHY

## 2021-01-29 ENCOUNTER — Telehealth: Payer: Self-pay | Admitting: Internal Medicine

## 2021-01-29 DIAGNOSIS — R920 Mammographic microcalcification found on diagnostic imaging of breast: Secondary | ICD-10-CM

## 2021-02-04 ENCOUNTER — Telehealth (INDEPENDENT_AMBULATORY_CARE_PROVIDER_SITE_OTHER): Payer: Medicare Other | Admitting: Internal Medicine

## 2021-02-04 ENCOUNTER — Ambulatory Visit: Payer: PRIVATE HEALTH INSURANCE | Admitting: Internal Medicine

## 2021-02-04 ENCOUNTER — Encounter: Payer: Self-pay | Admitting: Internal Medicine

## 2021-02-04 VITALS — Ht 63.0 in | Wt 139.0 lb

## 2021-02-04 DIAGNOSIS — I10 Essential (primary) hypertension: Secondary | ICD-10-CM

## 2021-02-04 DIAGNOSIS — R194 Change in bowel habit: Secondary | ICD-10-CM | POA: Diagnosis not present

## 2021-02-04 DIAGNOSIS — R079 Chest pain, unspecified: Secondary | ICD-10-CM

## 2021-02-04 DIAGNOSIS — R0789 Other chest pain: Secondary | ICD-10-CM | POA: Diagnosis not present

## 2021-02-04 DIAGNOSIS — E785 Hyperlipidemia, unspecified: Secondary | ICD-10-CM | POA: Diagnosis not present

## 2021-02-04 NOTE — Progress Notes (Signed)
Telephone Note  This visit type was conducted due to national recommendations for restrictions regarding the COVID-19 pandemic (e.g. social distancing).  This format is felt to be most appropriate for this patient at this time.  All issues noted in this document were discussed and addressed.  No physical exam was performed (except for noted visual exam findings with Video Visits).   I connected with@ on 02/04/21 at  4:00 PM EST by  telephone and verified that I am speaking with the correct person using two identifiers. Location patient: home Location provider: work or home office Persons participating in the virtual visit: patient, provider  I discussed the limitations, risks, security and privacy concerns of performing an evaluation and management service by telephone and the availability of in person appointments. I also discussed with the patient that there may be a patient responsible charge related to this service. The patient expressed understanding and agreed to proceed.  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  It does not radiate  To jaw or arm.  Is not accompanied by nausea , diaphoresis or dyspnea.  It is temporarily relieved by lying supine with a tennis ball stationed between her shoulder blades.   2) change in BMs.  For the last several months bowel movements have been more frequent and loose,  Occurring in the morning upon waking,  And before eating or drinking anything.  Consistency has improved to "soft" but frequency (3) has not changed since taking a daily dose of metamucil.  No nightttime meds.  No change in diet.  No unintentional weight loss.  No cramping last colonoscopy normal in Dec 2019 noted one sessile 3 mm polyp in colon , . Diverticulosis and intermal hemorrhoids.     ROS: See pertinent positives and negatives per HPI.  Past Medical History:  Diagnosis Date  . Arthritis    hands  . Colon polyps   . Hyperlipidemia   . Hypertension   . PONV (postoperative nausea and vomiting)     Past Surgical History:  Procedure Laterality Date  . ABDOMINOPLASTY  1995  . CATARACT EXTRACTION, BILATERAL    . CESAREAN SECTION  1989  . COLONOSCOPY WITH PROPOFOL N/A 11/29/2018   Procedure: COLONOSCOPY WITH PROPOFOL;  Surgeon: [name], MD;  Location: [facility];  Service: Endoscopy;  Laterality: N/A;  . POLYPECTOMY N/A 11/29/2018   Procedure: POLYPECTOMY;  Surgeon: [name], MD;  Location: [facility];  Service: Endoscopy;  Laterality: N/A;    Family History  Problem Relation Age of Onset  . Arthritis Mother   . Hyperlipidemia Mother   . Hypertension Mother   . Heart disease Mother   . Uterine cancer Mother   . Arthritis Father   . Cancer Father   . Hyperlipidemia Father   . Hypertension Father   . Heart disease Father   . Diabetes Father   . Hyperlipidemia Brother   . Hypertension Brother     SOCIAL HX:  reports that she has never smoked. She has never used smokeless tobacco. She reports current alcohol use. She reports that she does not use drugs.   Current Outpatient Medications:  .  amLODipine (NORVASC) 5 MG tablet, TAKE ONE TABLET EVERY DAY, Disp: 90 tablet, Rfl: 1 .  atorvastatin (LIPITOR) 10 MG  tablet, TAKE ONE TABLET EVERY DAY, Disp: 90 tablet, Rfl: 1 .  Biotin 5 MG CAPS, Take 1 capsule by mouth daily., Disp: , Rfl:  .  Calcium Carbonate-Vitamin D (CALCIUM-VITAMIN D) 500-200 MG-UNIT tablet, Take 1 tablet by mouth daily. , Disp: , Rfl:  .  Cholecalciferol 25 MCG (1000 UT) tablet, Take 1,000 Units by mouth daily., Disp: , Rfl:  .  EPINEPHRINE 0.3 mg/0.3 mL IJ SOAJ injection, USE AS DIRECTED, Disp: 2 each, Rfl: 0 .  fexofenadine (ALLEGRA) 60 MG tablet, Take 60 mg by mouth 2 (two) times daily., Disp: , Rfl:  .   fluticasone (FLONASE) 50 MCG/ACT nasal spray, Place 1 spray into both nostrils daily., Disp: , Rfl:  .  Misc. Devices (ALL-BODY MASSAGE) MISC, by Does not apply route., Disp: , Rfl:  .  Multiple Vitamin (MULTIVITAMIN) tablet, Take by mouth., Disp: , Rfl:  .  triamcinolone (NASACORT) 55 MCG/ACT AERO nasal inhaler, Place into the nose., Disp: , Rfl:   EXAM:  General appearance: alert, cooperative and articulate.  No signs of being in distress  Lungs: not short of breath ,  No cough, speaking in full sentences  Psych: affect normal,dspeech is articulate and non pressured .  Denies suicidal thoughts   ASSESSMENT AND PLAN:  Discussed the following assessment and plan:  Atypical chest pain - Plan: DG Chest 2 View, DG Thoracic Spine W/Swimmers  Essential hypertension, benign  Hyperlipidemia LDL goal <130  Change in bowel habits  Atypical chest pain Current symptoms suggest a MSK origin and she has occupational risks for thoracic spine degenerative disease.  Plain films of chest and thoracic spine ordered  To rule out aortic aneursym, cardiomegaly and DDD/spondylosis.  However, given her multiple cardiac risk factors and absence of any prior risk stratification,  Recommending cardiac evaluation    Essential hypertension, benign Home readings have been < 130/80 on amlodipine  Hyperlipidemia LDL goal <130 Managed with atorvastatin LDL goal < 100.    Lab Results  Component Value Date   CHOL 163 05/11/2019   HDL 57.50 05/11/2019   LDLCALC 89 05/11/2019   LDLDIRECT 90.0 03/05/2016   TRIG 79.0 05/11/2019   CHOLHDL 3 05/11/2019   Lab Results  Component Value Date   ALT 18 08/06/2020   AST 19 08/06/2020   ALKPHOS 78 08/06/2020   BILITOT 0.4 08/06/2020      Change in bowel habits Stool habits changed from once daily in the early morning,  To 3 liquids stools every morning, now to 3 soft stools since adding fiber.  Last colonoscopy reviewed and there have been no dietary or  medication changes  etiology likely  IBS , no warning symptoms     I discussed the assessment and treatment plan with the patient. The patient was provided an opportunity to ask questions and all were answered. The patient agreed with the plan and demonstrated an understanding of the instructions.   The patient was advised to call back or seek an in-person evaluation if the symptoms worsen or if the condition fails to improve as anticipated.   I spent 30 minutes dedicated to the care of this patient on the date of this encounter to include pre-visit review of his medical history,  Face-to-face time with the patient , and post visit ordering of testing and therapeutics.    Sherlene Shams, MD

## 2021-02-05 DIAGNOSIS — K589 Irritable bowel syndrome without diarrhea: Secondary | ICD-10-CM | POA: Insufficient documentation

## 2021-02-05 DIAGNOSIS — R0789 Other chest pain: Secondary | ICD-10-CM | POA: Insufficient documentation

## 2021-02-05 DIAGNOSIS — R194 Change in bowel habit: Secondary | ICD-10-CM | POA: Insufficient documentation

## 2021-02-05 NOTE — Assessment & Plan Note (Signed)
Stool habits changed from once daily in the early morning,  To 3 liquids stools every morning, now to 3 soft stools since adding fiber.  Last colonoscopy reviewed and there have been no dietary or medication changes  etiology likely  IBS , no warning symptoms

## 2021-02-05 NOTE — Assessment & Plan Note (Signed)
Managed with atorvastatin LDL goal < 100.    Lab Results  Component Value Date   CHOL 163 05/11/2019   HDL 57.50 05/11/2019   LDLCALC 89 05/11/2019   LDLDIRECT 90.0 03/05/2016   TRIG 79.0 05/11/2019   CHOLHDL 3 05/11/2019   Lab Results  Component Value Date   ALT 18 08/06/2020   AST 19 08/06/2020   ALKPHOS 78 08/06/2020   BILITOT 0.4 08/06/2020

## 2021-02-05 NOTE — Assessment & Plan Note (Signed)
Current symptoms suggest a MSK origin and she has occupational risks for thoracic spine degenerative disease.  Plain films of chest and thoracic spine ordered  To rule out aortic aneursym, cardiomegaly and DDD/spondylosis.  However, given her multiple cardiac risk factors and absence of any prior risk stratification,  Recommending cardiac evaluation

## 2021-02-05 NOTE — Assessment & Plan Note (Signed)
Home readings have been < 130/80 on amlodipine

## 2021-02-06 ENCOUNTER — Other Ambulatory Visit: Payer: Self-pay

## 2021-02-06 ENCOUNTER — Ambulatory Visit (INDEPENDENT_AMBULATORY_CARE_PROVIDER_SITE_OTHER): Payer: Medicare Other

## 2021-02-06 ENCOUNTER — Ambulatory Visit (INDEPENDENT_AMBULATORY_CARE_PROVIDER_SITE_OTHER): Payer: Medicare Other | Admitting: Internal Medicine

## 2021-02-06 ENCOUNTER — Encounter: Payer: Self-pay | Admitting: Internal Medicine

## 2021-02-06 ENCOUNTER — Ambulatory Visit: Payer: PRIVATE HEALTH INSURANCE | Admitting: Internal Medicine

## 2021-02-06 VITALS — BP 112/64 | HR 82 | Temp 97.7°F | Resp 14 | Ht 63.0 in | Wt 139.2 lb

## 2021-02-06 DIAGNOSIS — M542 Cervicalgia: Secondary | ICD-10-CM | POA: Diagnosis not present

## 2021-02-06 DIAGNOSIS — R7303 Prediabetes: Secondary | ICD-10-CM | POA: Diagnosis not present

## 2021-02-06 DIAGNOSIS — E785 Hyperlipidemia, unspecified: Secondary | ICD-10-CM

## 2021-02-06 DIAGNOSIS — R944 Abnormal results of kidney function studies: Secondary | ICD-10-CM

## 2021-02-06 DIAGNOSIS — M549 Dorsalgia, unspecified: Secondary | ICD-10-CM

## 2021-02-06 DIAGNOSIS — R194 Change in bowel habit: Secondary | ICD-10-CM

## 2021-02-06 DIAGNOSIS — R0789 Other chest pain: Secondary | ICD-10-CM

## 2021-02-06 DIAGNOSIS — F4329 Adjustment disorder with other symptoms: Secondary | ICD-10-CM

## 2021-02-06 NOTE — Patient Instructions (Addendum)
Try 1/2 caplet of Imodium with each meal and continue metamucil  If that doesn't help you can increase to 1 caplet. If it causes constipation:  Plan B;  Trial of dicyclomine and metamucil (rx)   Pease go get cervical and thoracic spine films at your leisure

## 2021-02-06 NOTE — Progress Notes (Addendum)
Subjective:  Patient ID: Maria Kemp, female    DOB: Oct 17, 1956  Age: 65 y.o. MRN: [id number]  CC: The primary encounter diagnosis was Cervicalgia of occipito-atlanto-axial region. Diagnoses of Hyperlipidemia LDL goal <130, Prediabetes, Mid back pain, Atypical chest pain, Change in bowel habits, and Decreased GFR were also pertinent to this visit.  HPI Maria Kemp presents for FOLLOW UP ON MULTIPLE issues:  This visit occurred during the SARS-CoV-2 public health emergency.  Safety protocols were in place, including screening questions prior to the visit, additional usage of staff PPE, and extensive cleaning of exam room while observing appropriate contact time as indicated for disinfecting solutions.     1) atypical chest wall pain occurring at rest and accompanied by parspinus muscle spasm ane posterior neck pain.  Has not had spine films in several years.  Occupational hazard as a Engineering geologist   2) POST PRANDIAL STOOLING,  INITIALLY LIQUID, WITH URGENCY:  now improved consistency with daily use of metamucil,  But still accompanied flatulence .  Denies abdominal pain.Reviewed most recent colonoscopies  3) Adjusting to suddenly empty household following the loss of both parents within days of each other.  She dreads coming home to an empty house.  Still in conflict with brother Maria Kemp who did not share in the caregiving responsibilities.       Outpatient Medications Prior to Visit  Medication Sig Dispense Refill  . amLODipine (NORVASC) 5 MG tablet TAKE ONE TABLET EVERY DAY 90 tablet 1  . atorvastatin (LIPITOR) 10 MG tablet TAKE ONE TABLET EVERY DAY 90 tablet 1  . EPINEPHRINE 0.3 mg/0.3 mL IJ SOAJ injection USE AS DIRECTED 2 each 0  . fexofenadine (ALLEGRA) 60 MG tablet Take 60 mg by mouth 2 (two) times daily.    . fluticasone (FLONASE) 50 MCG/ACT nasal spray Place 1 spray into both nostrils daily.    . Misc. Devices (ALL-BODY MASSAGE) MISC by Does not apply route.    .  triamcinolone (NASACORT) 55 MCG/ACT AERO nasal inhaler Place into the nose.    . Biotin 5 MG CAPS Take 1 capsule by mouth daily. (Patient not taking: Reported on 02/06/2021)    . Calcium Carbonate-Vitamin D (CALCIUM-VITAMIN D) 500-200 MG-UNIT tablet Take 1 tablet by mouth daily.  (Patient not taking: Reported on 02/06/2021)    . Cholecalciferol 25 MCG (1000 UT) tablet Take 1,000 Units by mouth daily. (Patient not taking: Reported on 02/06/2021)    . Multiple Vitamin (MULTIVITAMIN) tablet Take by mouth. (Patient not taking: Reported on 02/06/2021)     No facility-administered medications prior to visit.    Review of Systems;  Patient denies headache, fevers, malaise, unintentional weight loss, skin rash, eye pain, sinus congestion and sinus pain, sore throat, dysphagia,  hemoptysis , cough, dyspnea, wheezing,, palpitations, orthopnea, edema, abdominal pain, nausea, melena, diarrhea, constipation, flank pain, dysuria, hematuria, urinary  Frequency, nocturia, numbness, tingling, seizures,  Focal weakness, Loss of consciousness,  Tremor, insomnia, depression, anxiety, and suicidal ideation.      Objective:  BP 112/64 (BP Location: Left Arm, Patient Position: Sitting, Cuff Size: Normal)   Pulse 82   Temp 97.7 F (36.5 C) (Oral)   Resp 14   Ht 5\' 3"  (1.6 m)   Wt 139 lb 3.2 oz (63.1 kg)   SpO2 96%   BMI 24.66 kg/m   BP Readings from Last 3 Encounters:  02/06/21 112/64  01/16/21 134/74  08/01/20 108/72    Wt Readings from Last 3 Encounters:  02/06/21 139  lb 3.2 oz (63.1 kg)  02/04/21 139 lb (63 kg)  01/16/21 139 lb 4.8 oz (63.2 kg)    General appearance: alert, cooperative and appears stated age Ears: normal TM's and external ear canals both ears Throat: lips, mucosa, and tongue normal; teeth and gums normal Neck: no adenopathy, no carotid bruit, supple, symmetrical, trachea midline and thyroid not enlarged, symmetric, no tenderness/mass/nodules Back: symmetric, no curvature. ROM normal.  No CVA tenderness. Lungs: clear to auscultation bilaterally Heart: regular rate and rhythm, S1, S2 normal, no murmur, click, rub or gallop MSK:  Left paraspinus muscles in spasm   No vertebral tenderness Abdomen: soft, non-tender; bowel sounds normal; no masses,  no organomegaly Pulses: 2+ and symmetric Skin: Skin color, texture, turgor normal. No rashes or lesions Lymph nodes: Cervical, supraclavicular, and axillary nodes normal.  Lab Results  Component Value Date   HGBA1C 5.9 02/06/2021   HGBA1C 6.2 08/06/2020   HGBA1C 6.1 05/11/2019    Lab Results  Component Value Date   CREATININE 1.18 02/06/2021   CREATININE 0.76 08/06/2020   CREATININE 0.65 06/09/2019    Lab Results  Component Value Date   WBC 3.4 (L) 08/06/2020   HGB 13.4 08/06/2020   HCT 39.9 08/06/2020   PLT 354.0 08/06/2020   GLUCOSE 95 02/06/2021   CHOL 163 02/06/2021   TRIG 74.0 02/06/2021   HDL 54.50 02/06/2021   LDLDIRECT 90.0 03/05/2016   LDLCALC 94 02/06/2021   ALT 17 02/06/2021   AST 17 02/06/2021   NA 140 02/06/2021   K 3.8 02/06/2021   CL 104 02/06/2021   CREATININE 1.18 02/06/2021   BUN 18 02/06/2021   CO2 30 02/06/2021   TSH 0.50 02/06/2021   HGBA1C 5.9 02/06/2021   MICROALBUR 2.6 (H) 05/11/2017    MR FINGERS RICHT WO CONTRAST  Result Date: 09/04/2020 CLINICAL DATA:  Right thumb mass present for greater than 1 year EXAM: MRI OF THE RIGHT FINGERS WITHOUT CONTRAST TECHNIQUE: Multiplanar, multisequence MR imaging of the right thumb was performed. No intravenous contrast was administered. COMPARISON:  None available FINDINGS: Bones/Joint/Cartilage No acute fracture. No dislocation. No bone marrow edema. No suspicious marrow replacing lesion. Mild osteoarthritis of the first MCP joint and interphalangeal joint of the thumb. No joint effusion. Ligaments Collateral ligaments of the thumb are intact. Muscles and Tendons Intact flexor and extensor tendons. No tenosynovitis. Normal muscle bulk and signal  intensity. Soft tissues There is a solid T1/T2 heterogeneously hypointense mass within the volar soft tissues of the right thumb adjacent to the ulnar aspect of the flexor tendon just proximal to the thumb IP joint. Mass measures 1.0 x 0.7 x 1.2 cm (series 12, image 11; series 9, image 16). No erosion to the underlying proximal phalanx. No additional soft tissue masses. No associated soft tissue edema. No cystic lesions or fluid collections. IMPRESSION: 1. Well-circumscribed 1.2 cm solid mass within the volar soft tissues of the right thumb adjacent to the flexor tendon just proximal to the thumb IP joint. Imaging characteristics are most suggestive of a giant cell tumor of the tendon sheath. 2. Mild osteoarthritis of the first MCP joint and interphalangeal joint of the thumb. Electronically Signed   By: Duanne Guess D.O.   On: 09/04/2020 10:35    Assessment & Plan:   Problem List Items Addressed This Visit      Unprioritized   Hyperlipidemia LDL goal <130   Relevant Orders   Comprehensive metabolic panel (Completed)   Lipid panel (Completed)   TSH (Completed)  Atypical chest pain    History and exam consistent with radicular pain from spine . Plain films negative for fractures.  Positive for spurring and c6-7 herniation.  Referring for PT      Change in bowel habits    Without fevers,  Pain or weight loss,  Suggestive of functional diarrhea due to IBS.  Trial of Imodium 1/4 dose prior to meals to slow down transit       Prediabetes    Improved risk of diabetes noted , with lowering of a1c  Lab Results  Component Value Date   HGBA1C 5.9 02/06/2021          Relevant Orders   Hemoglobin A1c (Completed)   Decreased GFR    Noted,  Will repeat in one week when well hydrated        Other Visit Diagnoses    Cervicalgia of occipito-atlanto-axial region    -  Primary   Relevant Orders   DG Cervical Spine Complete (Completed)   Mid back pain       Relevant Orders   DG Thoracic  Spine 2 View (Completed)      I am having Maria Kemp maintain her Cholecalciferol, calcium-vitamin D, multivitamin, Biotin, All-Body Massage, fluticasone, fexofenadine, amLODipine, atorvastatin, EPINEPHrine, and triamcinolone.  No orders of the defined types were placed in this encounter.   There are no discontinued medications.  Follow-up: Return in about 4 months (around 06/08/2021).   Sherlene Shams, MD

## 2021-02-07 DIAGNOSIS — R7303 Prediabetes: Secondary | ICD-10-CM | POA: Insufficient documentation

## 2021-02-07 DIAGNOSIS — F4329 Adjustment disorder with other symptoms: Secondary | ICD-10-CM | POA: Insufficient documentation

## 2021-02-07 DIAGNOSIS — R944 Abnormal results of kidney function studies: Secondary | ICD-10-CM | POA: Insufficient documentation

## 2021-02-07 LAB — COMPREHENSIVE METABOLIC PANEL
ALT: 17 U/L (ref 0–35)
AST: 17 U/L (ref 0–37)
Albumin: 4.2 g/dL (ref 3.5–5.2)
Alkaline Phosphatase: 78 U/L (ref 39–117)
BUN: 18 mg/dL (ref 6–23)
CO2: 30 mEq/L (ref 19–32)
Calcium: 9.7 mg/dL (ref 8.4–10.5)
Chloride: 104 mEq/L (ref 96–112)
Creatinine, Ser: 1.18 mg/dL (ref 0.40–1.20)
GFR: 48.47 mL/min — ABNORMAL LOW (ref >60.00)
Glucose, Bld: 95 mg/dL (ref 70–99)
Potassium: 3.8 mEq/L (ref 3.5–5.1)
Sodium: 140 mEq/L (ref 135–145)
Total Bilirubin: 0.3 mg/dL (ref 0.2–1.2)
Total Protein: 6.8 g/dL (ref 6.0–8.3)

## 2021-02-07 LAB — LIPID PANEL
Cholesterol: 163 mg/dL (ref 0–200)
HDL: 54.5 mg/dL (ref >39.00)
LDL Cholesterol: 94 mg/dL (ref 0–99)
NonHDL: 108.48
Total CHOL/HDL Ratio: 3
Triglycerides: 74 mg/dL (ref 0.0–149.0)
VLDL: 14.8 mg/dL (ref 0.0–40.0)

## 2021-02-07 LAB — HEMOGLOBIN A1C: Hgb A1c MFr Bld: 5.9 % (ref 4.6–6.5)

## 2021-02-07 LAB — TSH: TSH: 0.5 u[IU]/mL (ref 0.35–4.50)

## 2021-02-07 NOTE — Assessment & Plan Note (Signed)
History and exam consistent with radicular pain from spine . Plain films negative for fractures.  Positive for spurring and c6-7 herniation.  Referring for PT

## 2021-02-07 NOTE — Assessment & Plan Note (Signed)
Improved risk of diabetes noted , with lowering of a1c  Lab Results  Component Value Date   HGBA1C 5.9 02/06/2021

## 2021-02-07 NOTE — Assessment & Plan Note (Signed)
Noted,  Will repeat in one week when well hydrated

## 2021-02-07 NOTE — Assessment & Plan Note (Addendum)
Without fevers,  Pain or weight loss,  Suggestive of functional diarrhea due to IBS.  Trial of Imodium 1/4 dose prior to meals to slow down transit

## 2021-02-07 NOTE — Assessment & Plan Note (Signed)
Patient is dealing with the unexpected loss of  Both parents ;  and has adequate coping skills and emotional support .  i have asked patinet to return in one month to examine for signs of unresolving grief.

## 2021-02-12 ENCOUNTER — Telehealth: Payer: Self-pay | Admitting: *Deleted

## 2021-02-12 ENCOUNTER — Other Ambulatory Visit: Payer: Self-pay | Admitting: Internal Medicine

## 2021-02-12 DIAGNOSIS — R944 Abnormal results of kidney function studies: Secondary | ICD-10-CM

## 2021-02-12 NOTE — Telephone Encounter (Signed)
Please place future orders for lab appt.  

## 2021-02-13 ENCOUNTER — Other Ambulatory Visit: Payer: Self-pay

## 2021-02-13 ENCOUNTER — Other Ambulatory Visit (INDEPENDENT_AMBULATORY_CARE_PROVIDER_SITE_OTHER): Payer: Medicare Other

## 2021-02-13 ENCOUNTER — Other Ambulatory Visit: Payer: Self-pay | Admitting: Internal Medicine

## 2021-02-13 DIAGNOSIS — R944 Abnormal results of kidney function studies: Secondary | ICD-10-CM | POA: Diagnosis not present

## 2021-02-14 LAB — RENAL FUNCTION PANEL
Albumin: 4.4 g/dL (ref 3.5–5.2)
BUN: 13 mg/dL (ref 6–23)
CO2: 30 mEq/L (ref 19–32)
Calcium: 9.6 mg/dL (ref 8.4–10.5)
Chloride: 103 mEq/L (ref 96–112)
Creatinine, Ser: 0.87 mg/dL (ref 0.40–1.20)
GFR: 69.87 mL/min (ref >60.00)
Glucose, Bld: 116 mg/dL — ABNORMAL HIGH (ref 70–99)
Phosphorus: 3 mg/dL (ref 2.3–4.6)
Potassium: 3.1 mEq/L — ABNORMAL LOW (ref 3.5–5.1)
Sodium: 141 mEq/L (ref 135–145)

## 2021-02-15 ENCOUNTER — Other Ambulatory Visit: Payer: Self-pay | Admitting: Internal Medicine

## 2021-02-15 MED ORDER — POTASSIUM CHLORIDE CRYS ER 20 MEQ PO TBCR: 20.0000 meq | EXTENDED_RELEASE_TABLET | Freq: Two times a day (BID) | ORAL | 0 refills | Status: DC

## 2021-05-27 ENCOUNTER — Other Ambulatory Visit: Payer: Self-pay | Admitting: Internal Medicine

## 2021-06-07 IMAGING — DX DG THORACIC SPINE 2V
3 series · 3 of 3 positions shown · non-contrast
Comparison: None.

CLINICAL DATA: Thoracic back pain.

EXAM:
THORACIC SPINE 2 VIEWS

[thoracic spine ap]
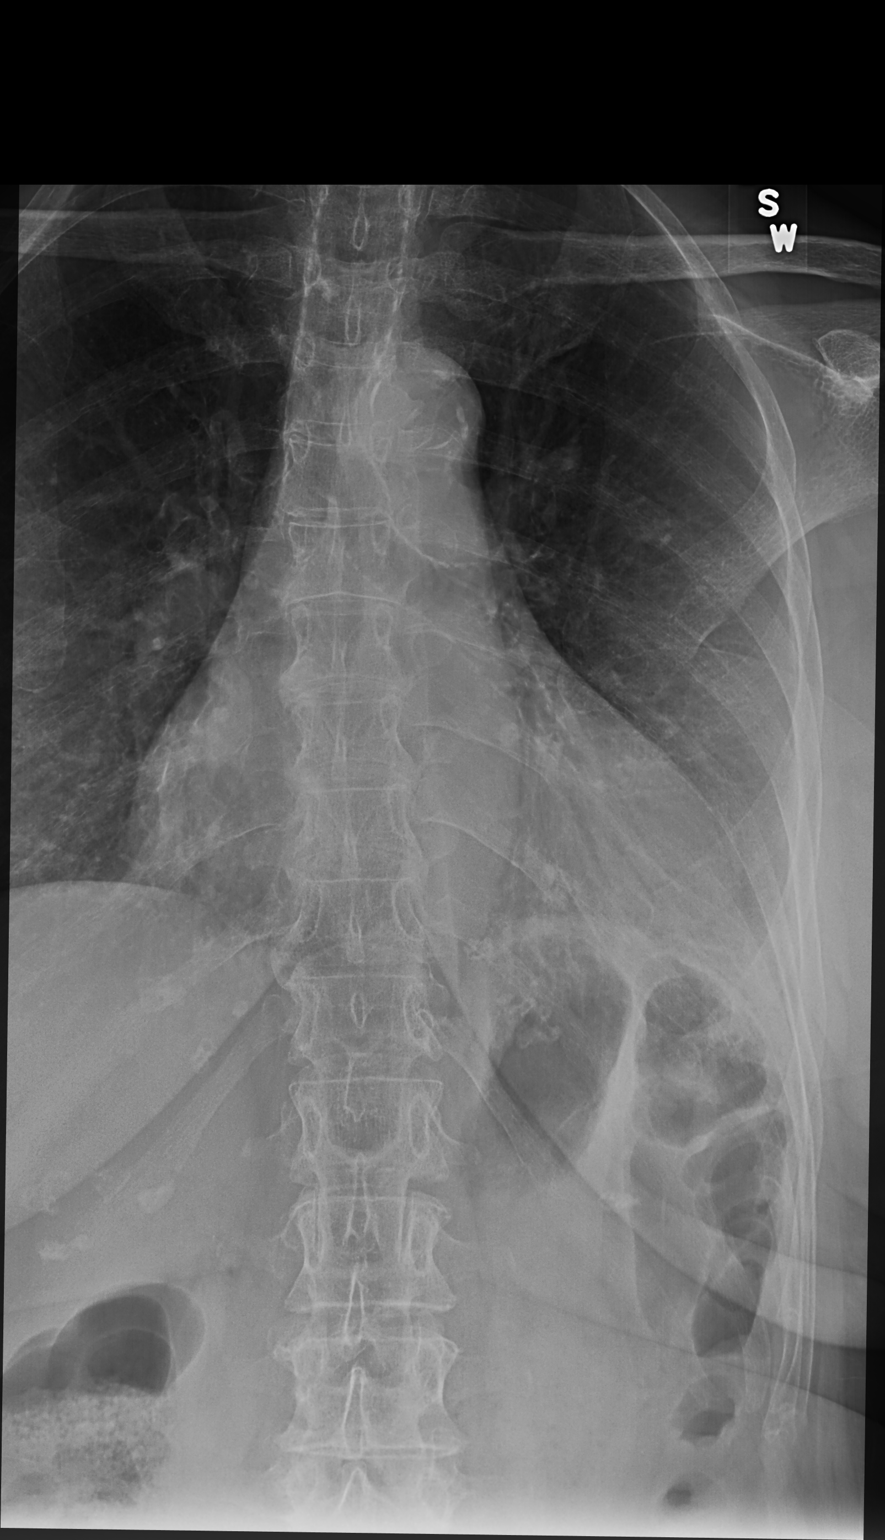

[thoracic spine lat]
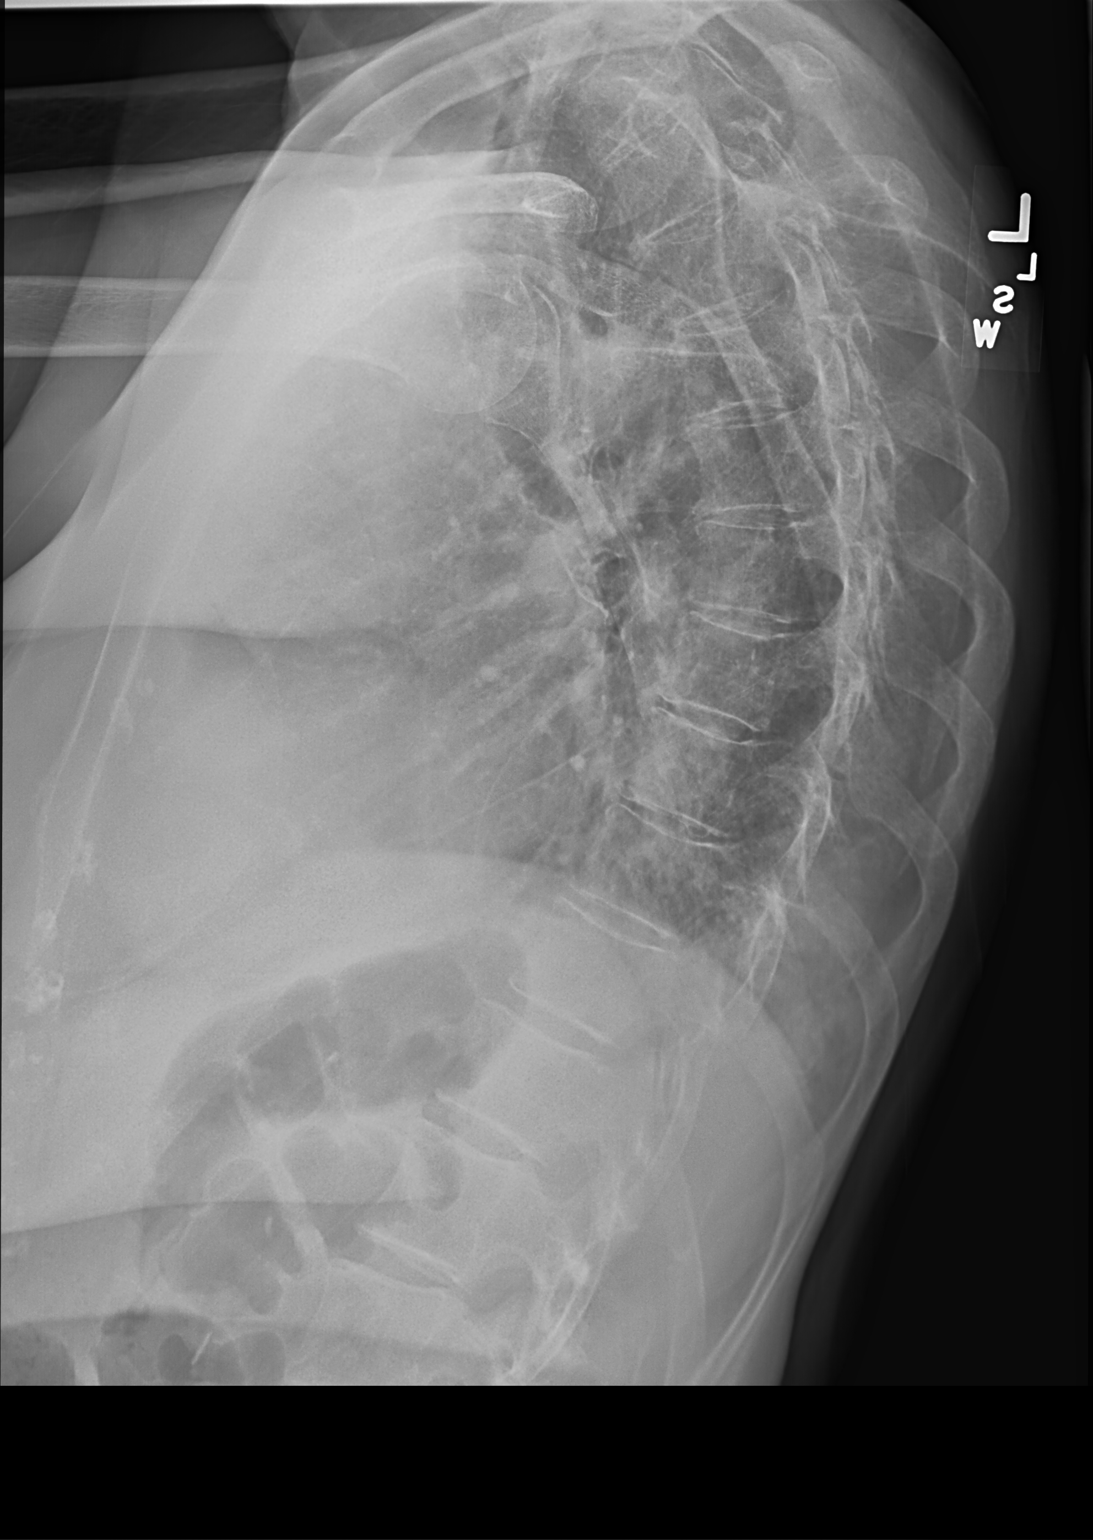

[swimmers lat]
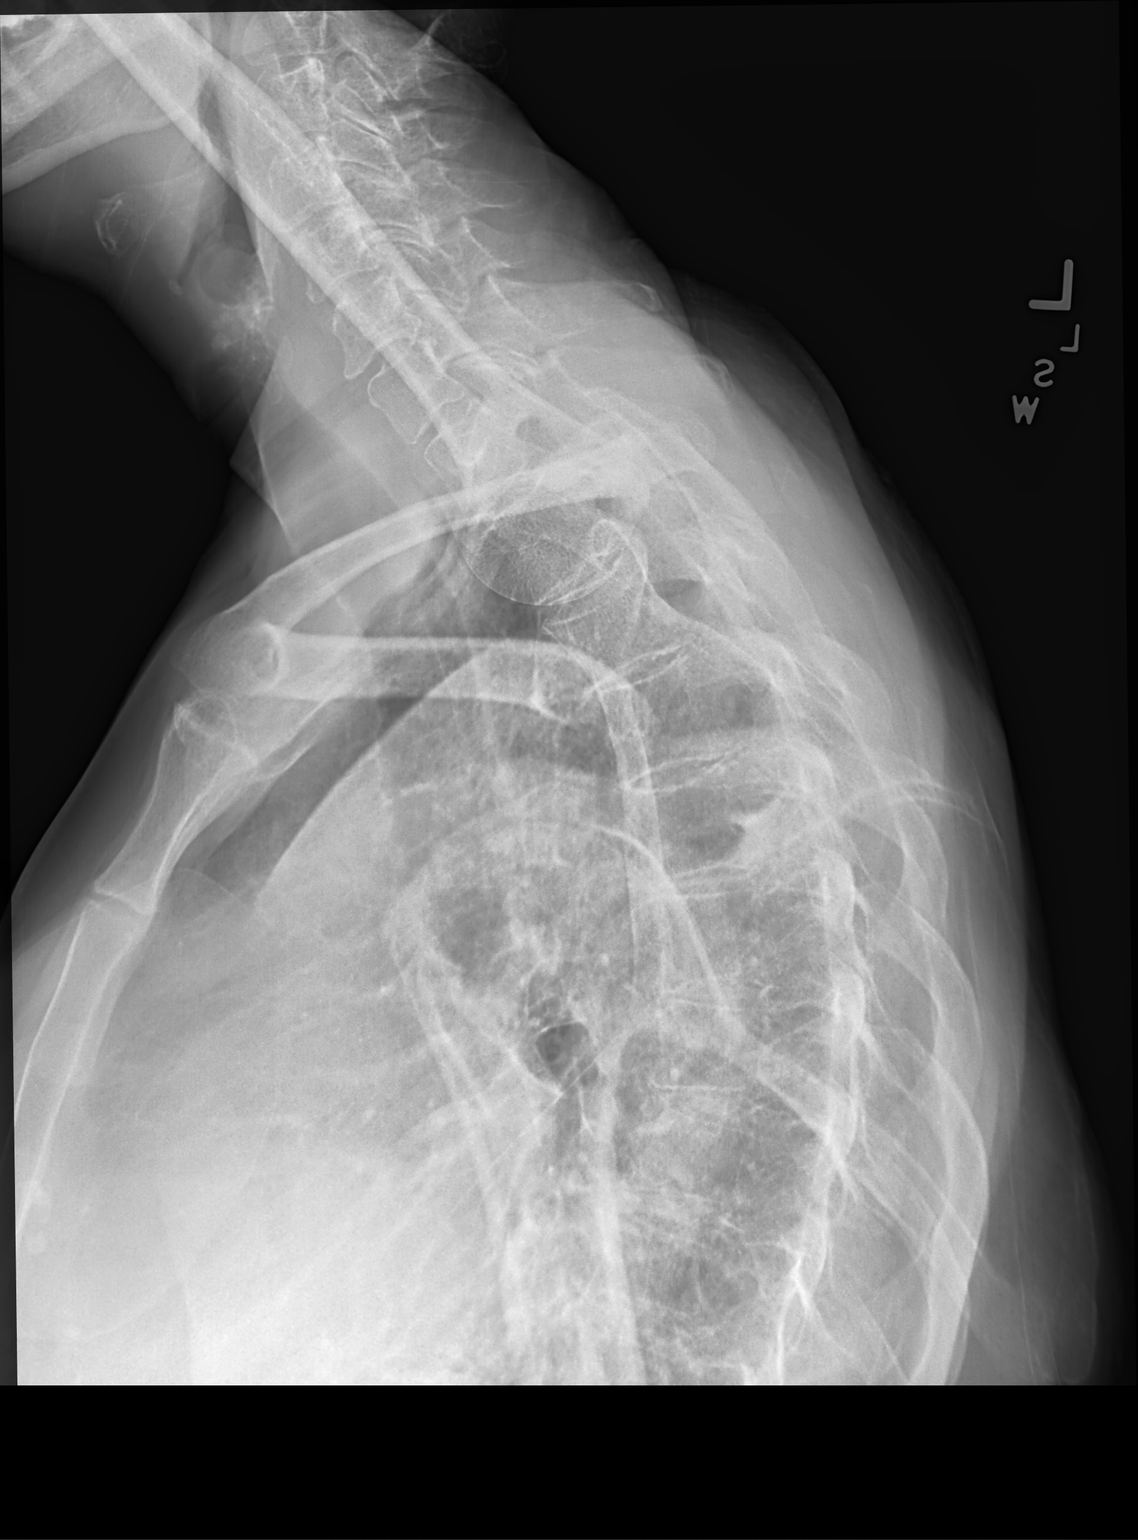

[3 of 3 positions shown; findings below may reference images not displayed]

FINDINGS: There are 12 pairs of ribs peer the alignment is maintained.
Vertebral body heights are maintained. Minimal endplate spurring in
the midthoracic spine without significant disc space narrowing. No
fracture, evidence of focal bone lesion or bone destruction. There
is no paravertebral soft tissue abnormality.
IMPRESSION: Minimal endplate spurring in the midthoracic spine.

## 2021-06-13 ENCOUNTER — Other Ambulatory Visit: Payer: Self-pay | Admitting: Internal Medicine

## 2021-07-02 ENCOUNTER — Other Ambulatory Visit: Payer: Self-pay

## 2021-07-02 ENCOUNTER — Encounter: Payer: Self-pay | Admitting: Internal Medicine

## 2021-07-02 ENCOUNTER — Ambulatory Visit (INDEPENDENT_AMBULATORY_CARE_PROVIDER_SITE_OTHER): Payer: Medicare Other | Admitting: Internal Medicine

## 2021-07-02 VITALS — BP 128/70 | HR 72 | Temp 96.3°F | Resp 15 | Ht 63.0 in | Wt 140.0 lb

## 2021-07-02 DIAGNOSIS — J029 Acute pharyngitis, unspecified: Secondary | ICD-10-CM | POA: Insufficient documentation

## 2021-07-02 DIAGNOSIS — R194 Change in bowel habit: Secondary | ICD-10-CM | POA: Diagnosis not present

## 2021-07-02 DIAGNOSIS — K591 Functional diarrhea: Secondary | ICD-10-CM | POA: Diagnosis not present

## 2021-07-02 MED ORDER — MOLNUPIRAVIR EUA 200MG CAPSULE: 4.0000 | ORAL_CAPSULE | Freq: Two times a day (BID) | ORAL | 0 refills | Status: AC

## 2021-07-02 MED ORDER — HYOSCYAMINE SULFATE 0.125 MG PO TABS: 0.1250 mg | ORAL_TABLET | ORAL | 0 refills | Status: DC | PRN

## 2021-07-02 NOTE — Patient Instructions (Signed)
For the diarrhea:  1) try the hyoscyamine first.  15-30 min before meals (excluding your regular breakfast)  2) add Imodium after a day or two if the hyoscyamine doesn't help  3) Referral to [facility] in progress   4) rx for molnupiravir on file at [facility]   5)  change your pillow,  try a chin strap, put a HEPA filtre in bed room   If no improvement in morning throat symptoms, ENT referral   .

## 2021-07-02 NOTE — Assessment & Plan Note (Signed)
She has had persistent post prandial diarrhea with the exception of small meals and a morning bagel with cream cheese.  The loose stools have been accompanied  by urgency and occur 1.5-2 hours after eating .  There has been no change after 7 months. Trial of hyoscyamine,  And GI referral

## 2021-07-02 NOTE — Progress Notes (Signed)
Subjective:  Patient ID: Maria Kemp, female    DOB: Jan 25, 1956  Age: 65 y.o. MRN: [id number]  CC: The primary encounter diagnosis was Change in bowel habits. Diagnoses of Functional diarrhea and Sore throat in the morning were also pertinent to this visit.  HPI [name] presents for  follow up   This visit occurred during the SARS-CoV-2 public health emergency.  Safety protocols were in place, including screening questions prior to the visit, additional usage of staff PPE, and extensive cleaning of exam room while observing appropriate contact time as indicated for disinfecting solutions.    1) chronic diarrhea 7 months. Occurs 1-2 hours after eating with urgency and loose stools .  Metamucil only helps the consistency a little.  Stools still very soft to loose.  Occurs after every meal.  Largest stool is in the morning . Has not tried imodium . Does not have a loose stool if she eats a very very small meal.  Wants to see [name].  Trial of hyoscyamine  2)  snoring at night?  Wakes up with sore throat several days per week .  Lives alone.  Uses allegra 3-4 days per week , better on those days.  Does not have sinus congestion  but uses  nasonex 1-2 times daily   3) Travelling to [place] with friends in August.  Wants TO HAVE RX waiting for her at pharmacy if she comes back and tests positive  4) Neck and back pain :  improved with regular massage every 2 weeks  Outpatient Medications Prior to Visit  Medication Sig Dispense Refill   amLODipine (NORVASC) 5 MG tablet TAKE 1 TABLET BY MOUTH DAILY 90 tablet 1   atorvastatin (LIPITOR) 10 MG tablet TAKE 1 TABLET BY MOUTH DAILY 90 tablet 1   EPINEPHRINE 0.3 mg/0.3 mL IJ SOAJ injection USE AS DIRECTED 2 each 0   fexofenadine (ALLEGRA) 60 MG tablet Take 60 mg by mouth 2 (two) times daily.     Misc. Devices (ALL-BODY MASSAGE) MISC by Does not apply route.     triamcinolone (NASACORT) 55 MCG/ACT AERO nasal inhaler Place into the nose.      Biotin 5 MG CAPS Take 1 capsule by mouth daily. (Patient not taking: Reported on 07/02/2021)     Calcium Carbonate-Vitamin D (CALCIUM-VITAMIN D) 500-200 MG-UNIT tablet Take 1 tablet by mouth daily.  (Patient not taking: Reported on 07/02/2021)     Cholecalciferol 25 MCG (1000 UT) tablet Take 1,000 Units by mouth daily. (Patient not taking: Reported on 07/02/2021)     fluticasone (FLONASE) 50 MCG/ACT nasal spray Place 1 spray into both nostrils daily. (Patient not taking: Reported on 07/02/2021)     Multiple Vitamin (MULTIVITAMIN) tablet Take by mouth. (Patient not taking: Reported on 07/02/2021)     potassium chloride SA (KLOR-CON) 20 MEQ tablet Take 1 tablet (20 mEq total) by mouth 2 (two) times daily. (Patient not taking: Reported on 07/02/2021) 4 tablet 0   No facility-administered medications prior to visit.    Review of Systems;  Patient denies headache, fevers, malaise, unintentional weight loss, skin rash, eye pain, sinus congestion and sinus pain, sore throat, dysphagia,  hemoptysis , cough, dyspnea, wheezing, chest pain, palpitations, orthopnea, edema, abdominal pain, nausea, melena, diarrhea, constipation, flank pain, dysuria, hematuria, urinary  Frequency, nocturia, numbness, tingling, seizures,  Focal weakness, Loss of consciousness,  Tremor, insomnia, depression, anxiety, and suicidal ideation.      Objective:  BP 128/70 (BP Location: Left Arm, Patient  Position: Sitting, Cuff Size: Normal)   Pulse 72   Temp (!) 96.3 F (35.7 C) (Temporal)   Resp 15   Ht 5\' 3"  (1.6 m)   Wt 140 lb (63.5 kg)   SpO2 98%   BMI 24.80 kg/m   BP Readings from Last 3 Encounters:  07/02/21 128/70  02/06/21 112/64  01/16/21 134/74    Wt Readings from Last 3 Encounters:  07/02/21 140 lb (63.5 kg)  02/06/21 139 lb 3.2 oz (63.1 kg)  02/04/21 139 lb (63 kg)    General appearance: alert, cooperative and appears stated age Ears: normal TM's and external ear canals both ears Throat: lips, mucosa, and  tongue normal; teeth and gums normal Neck: no adenopathy, no carotid bruit, supple, symmetrical, trachea midline and thyroid not enlarged, symmetric, no tenderness/mass/nodules Back: symmetric, no curvature. ROM normal. No CVA tenderness. Lungs: clear to auscultation bilaterally Heart: regular rate and rhythm, S1, S2 normal, no murmur, click, rub or gallop Abdomen: soft, non-tender; bowel sounds normal; no masses,  no organomegaly Pulses: 2+ and symmetric Skin: Skin color, texture, turgor normal. No rashes or lesions Lymph nodes: Cervical, supraclavicular, and axillary nodes normal.  Lab Results  Component Value Date   HGBA1C 5.9 02/06/2021   HGBA1C 6.2 08/06/2020   HGBA1C 6.1 05/11/2019    Lab Results  Component Value Date   CREATININE 0.87 02/13/2021   CREATININE 1.18 02/06/2021   CREATININE 0.76 08/06/2020    Lab Results  Component Value Date   WBC 3.4 (L) 08/06/2020   HGB 13.4 08/06/2020   HCT 39.9 08/06/2020   PLT 354.0 08/06/2020   GLUCOSE 116 (H) 02/13/2021   CHOL 163 02/06/2021   TRIG 74.0 02/06/2021   HDL 54.50 02/06/2021   LDLDIRECT 90.0 03/05/2016   LDLCALC 94 02/06/2021   ALT 17 02/06/2021   AST 17 02/06/2021   NA 141 02/13/2021   K 3.1 (L) 02/13/2021   CL 103 02/13/2021   CREATININE 0.87 02/13/2021   BUN 13 02/13/2021   CO2 30 02/13/2021   TSH 0.50 02/06/2021   HGBA1C 5.9 02/06/2021   MICROALBUR 2.6 (H) 05/11/2017    MR FINGERS RICHT WO CONTRAST  Result Date: 09/04/2020 CLINICAL DATA:  Right thumb mass present for greater than 1 year EXAM: MRI OF THE RIGHT FINGERS WITHOUT CONTRAST TECHNIQUE: Multiplanar, multisequence MR imaging of the right thumb was performed. No intravenous contrast was administered. COMPARISON:  None available FINDINGS: Bones/Joint/Cartilage No acute fracture. No dislocation. No bone marrow edema. No suspicious marrow replacing lesion. Mild osteoarthritis of the first MCP joint and interphalangeal joint of the thumb. No joint  effusion. Ligaments Collateral ligaments of the thumb are intact. Muscles and Tendons Intact flexor and extensor tendons. No tenosynovitis. Normal muscle bulk and signal intensity. Soft tissues There is a solid T1/T2 heterogeneously hypointense mass within the volar soft tissues of the right thumb adjacent to the ulnar aspect of the flexor tendon just proximal to the thumb IP joint. Mass measures 1.0 x 0.7 x 1.2 cm (series 12, image 11; series 9, image 16). No erosion to the underlying proximal phalanx. No additional soft tissue masses. No associated soft tissue edema. No cystic lesions or fluid collections. IMPRESSION: 1. Well-circumscribed 1.2 cm solid mass within the volar soft tissues of the right thumb adjacent to the flexor tendon just proximal to the thumb IP joint. Imaging characteristics are most suggestive of a giant cell tumor of the tendon sheath. 2. Mild osteoarthritis of the first MCP joint and interphalangeal joint  of the thumb. Electronically Signed   By: Duanne Guess D.O.   On: 09/04/2020 10:35    Assessment & Plan:   Problem List Items Addressed This Visit       Unprioritized   Change in bowel habits - Primary    She has had persistent post prandial diarrhea with the exception of small meals and a morning bagel with cream cheese.  The loose stools have been accompanied  by urgency and occur 1.5-2 hours after eating .  There has been no change after 7 months. Trial of hyoscyamine,  And GI referral        Relevant Orders   Ambulatory referral to Gastroenterology   Sore throat in the morning    Likely from mouth breathing but advised to try 1) change in pillows 2) use of HEPA filters in bedroom, and 3) use of chin strap       Other Visit Diagnoses     Functional diarrhea       Relevant Orders   Ambulatory referral to Gastroenterology      I provided  30 minutes of  face-to-face time during this encounter reviewing patient's current bowel problems and past surgeries,  labs and imaging studies, providing counseling on the above mentioned problems , and coordination  of care .   I am having Tiffany Kocher start on hyoscyamine and molnupiravir EUA. I am also having her maintain her Cholecalciferol, calcium-vitamin D, multivitamin, Biotin, All-Body Massage, fluticasone, fexofenadine, triamcinolone, potassium chloride SA, EPINEPHrine, atorvastatin, and amLODipine.  Meds ordered this encounter  Medications   hyoscyamine (LEVSIN) 0.125 MG tablet    Sig: Take 1 tablet (0.125 mg total) by mouth every 4 (four) hours as needed.    Dispense:  60 tablet    Refill:  0   molnupiravir EUA 200 mg CAPS    Sig: Take 4 capsules (800 mg total) by mouth 2 (two) times daily for 5 days.    Dispense:  40 capsule    Refill:  0    KEEP ON FILE FOR FUTURE USE    There are no discontinued medications.  Follow-up: No follow-ups on file.   Sherlene Shams, MD

## 2021-07-02 NOTE — Assessment & Plan Note (Signed)
Likely from mouth breathing but advised to try 1) change in pillows 2) use of HEPA filters in bedroom, and 3) use of chin strap

## 2021-07-30 LAB — HM MAMMOGRAPHY

## 2021-08-20 ENCOUNTER — Other Ambulatory Visit: Payer: Self-pay

## 2021-08-20 ENCOUNTER — Other Ambulatory Visit (INDEPENDENT_AMBULATORY_CARE_PROVIDER_SITE_OTHER): Payer: Medicare Other

## 2021-08-20 DIAGNOSIS — R300 Dysuria: Secondary | ICD-10-CM | POA: Diagnosis not present

## 2021-08-21 LAB — URINALYSIS, ROUTINE W REFLEX MICROSCOPIC
Bilirubin Urine: NEGATIVE
Ketones, ur: NEGATIVE
Nitrite: NEGATIVE
Specific Gravity, Urine: 1.02 (ref 1.000–1.030)
Total Protein, Urine: NEGATIVE
Urine Glucose: NEGATIVE
Urobilinogen, UA: 0.2 (ref 0.0–1.0)
pH: 6 (ref 5.0–8.0)

## 2021-08-21 LAB — URINE CULTURE
MICRO NUMBER:: 12366995
SPECIMEN QUALITY:: ADEQUATE

## 2021-08-21 NOTE — Telephone Encounter (Signed)
Has a follow up scheduled with you tomorrow at 11:30.

## 2021-08-22 ENCOUNTER — Other Ambulatory Visit: Payer: Self-pay

## 2021-08-22 ENCOUNTER — Telehealth (INDEPENDENT_AMBULATORY_CARE_PROVIDER_SITE_OTHER): Payer: Medicare Other | Admitting: Internal Medicine

## 2021-08-22 DIAGNOSIS — R109 Unspecified abdominal pain: Secondary | ICD-10-CM | POA: Diagnosis not present

## 2021-08-22 MED ORDER — CIPROFLOXACIN HCL 250 MG PO TABS: 250.0000 mg | ORAL_TABLET | Freq: Two times a day (BID) | ORAL | 0 refills | Status: AC

## 2021-08-22 MED ORDER — TAMSULOSIN HCL 0.4 MG PO CAPS: 0.4000 mg | ORAL_CAPSULE | Freq: Every day | ORAL | 3 refills | Status: DC

## 2021-08-22 NOTE — Patient Instructions (Addendum)
You do not hve a uti,  you may be passing small kidney stones which can partially block the ureter or the urethra.   Start the flomax once daily.  Do not start cipro unless you develop burning,  fevers,  or nausea   If symptoms do not resolve by Monday, I advise you to repeat the UA and consider CT scan without contrast to evaluate for stones

## 2021-08-22 NOTE — Progress Notes (Signed)
Virtual Visit via Caregility Note  This visit type was conducted due to national recommendations for restrictions regarding the COVID-19 pandemic (e.g. social distancing).  This format is felt to be most appropriate for this patient at this time.  All issues noted in this document were discussed and addressed.  No physical exam was performed (except for noted visual exam findings with Video Visits).   I connected withNAME@ on 08/22/21 at 11:30 AM EDT by a video enabled telemedicine application  and verified that I am speaking with the correct person using two identifiers. Location patient: home Location provider: work or home office Persons participating in the virtual visit: patient, provider  I discussed the limitations, risks, security and privacy concerns of performing an evaluation and management service by telephone and the availability of in person appointments. I also discussed with the patient that there may be a patient responsible charge related to this service. The patient expressed understanding and agreed to proceed.    Reason for visit: llq /flank pain,  dysuria  HPI:  65 yr old female no history of nephrolithiasis presents with one week history of dysuria and flank pain .  The dysuria  has resolved,  but the pain is now lower in her suprapubic area , nore in the inguinal area. And intermittent.  Relieved with ibuprofen  Reviewed UA and culture:  pyuria without  bacteruria,  calcium oxalate crystals noted.    ROS: See pertinent positives and negatives per HPI.  Past Medical History:  Diagnosis Date   Arthritis    hands   Colon polyps    Hyperlipidemia    Hypertension    PONV (postoperative nausea and vomiting)     Past Surgical History:  Procedure Laterality Date   ABDOMINOPLASTY  1995   CATARACT EXTRACTION, BILATERAL     CESAREAN SECTION  1989   COLONOSCOPY WITH PROPOFOL N/A 11/29/2018   Procedure: COLONOSCOPY WITH PROPOFOL;  Surgeon: [name], MD;   Location: [facility];  Service: Endoscopy;  Laterality: N/A;   POLYPECTOMY N/A 11/29/2018   Procedure: POLYPECTOMY;  Surgeon: [name], MD;  Location: [facility];  Service: Endoscopy;  Laterality: N/A;    Family History  Problem Relation Age of Onset   Arthritis Mother    Hyperlipidemia Mother    Hypertension Mother    Heart disease Mother    Uterine cancer Mother    Arthritis Father    Cancer Father    Hyperlipidemia Father    Hypertension Father    Heart disease Father    Diabetes Father    Hyperlipidemia Brother    Hypertension Brother     SOCIAL HX:  reports that she has never smoked. She has never used smokeless tobacco. She reports current alcohol use. She reports that she does not use drugs.    Current Outpatient Medications:    amLODipine (NORVASC) 5 MG tablet, TAKE 1 TABLET BY MOUTH DAILY, Disp: 90 tablet, Rfl: 1   atorvastatin (LIPITOR) 10 MG tablet, TAKE 1 TABLET BY MOUTH DAILY, Disp: 90 tablet, Rfl: 1   Biotin 5 MG CAPS, Take 1 capsule by mouth daily., Disp: , Rfl:    Calcium Carbonate-Vitamin D (CALCIUM-VITAMIN D) 500-200 MG-UNIT tablet, Take 1 tablet by mouth daily., Disp: , Rfl:    Cholecalciferol 25 MCG (1000 UT) tablet, Take 1,000 Units by mouth daily., Disp: , Rfl:    ciprofloxacin (CIPRO) 250 MG tablet, Take 1 tablet (250 mg total) by mouth 2 (two) times daily for 3 days.,  Disp: 6 tablet, Rfl: 0   EPINEPHRINE 0.3 mg/0.3 mL IJ SOAJ injection, USE AS DIRECTED, Disp: 2 each, Rfl: 0   fexofenadine (ALLEGRA) 60 MG tablet, Take 60 mg by mouth 2 (two) times daily., Disp: , Rfl:    fluticasone (FLONASE) 50 MCG/ACT nasal spray, Place 1 spray into both nostrils daily., Disp: , Rfl:    hyoscyamine (LEVSIN) 0.125 MG tablet, Take 1 tablet (0.125 mg total) by mouth every 4 (four) hours as needed., Disp: 60 tablet, Rfl: 0   Misc. Devices (ALL-BODY MASSAGE) MISC, by Does not apply route., Disp: , Rfl:    Multiple Vitamin (MULTIVITAMIN) tablet, Take by  mouth., Disp: , Rfl:    potassium chloride SA (KLOR-CON) 20 MEQ tablet, Take 1 tablet (20 mEq total) by mouth 2 (two) times daily., Disp: 4 tablet, Rfl: 0   tamsulosin (FLOMAX) 0.4 MG CAPS capsule, Take 1 capsule (0.4 mg total) by mouth daily., Disp: 30 capsule, Rfl: 3   triamcinolone (NASACORT) 55 MCG/ACT AERO nasal inhaler, Place into the nose., Disp: , Rfl:   EXAM:  VITALS per patient if applicable:  GENERAL: alert, oriented, appears well and in no acute distress  HEENT: atraumatic, conjunttiva clear, no obvious abnormalities on inspection of external nose and ears  NECK: normal movements of the head and neck  LUNGS: on inspection no signs of respiratory distress, breathing rate appears normal, no obvious gross SOB, gasping or wheezing  CV: no obvious cyanosis  MS: moves all visible extremities without noticeable abnormality  PSYCH/NEURO: pleasant and cooperative, no obvious depression or anxiety, speech and thought processing grossly intact  ASSESSMENT AND PLAN:  Discussed the following assessment and plan:  Flank pain  Flank pain Urine culture negative in untreated patient with pyuria, crystals and scant bacteria .  Suspect ureterolithiasis given recent history of travel and decreased hydration .  Starting   flomax.  Advised to suspend initiation of  cipro unless dysuria, nausea or fevers develop.  sent to local pharmacy    I discussed the assessment and treatment plan with the patient. The patient was provided an opportunity to ask questions and all were answered. The patient agreed with the plan and demonstrated an understanding of the instructions.   The patient was advised to call back or seek an in-person evaluation if the symptoms worsen or if the condition fails to improve as anticipated.   I spent 20 minutes dedicated to the care of this patient on the date of this encounter to include pre-visit review of hier medical history and recent labs,  Face-to-face time with  the patient , and post visit ordering of testing and therapeutics.    Sherlene Shams, MD

## 2021-08-22 NOTE — Assessment & Plan Note (Signed)
Urine culture negative in untreated patient with pyuria, crystals and scant bacteria .  Suspect ureterolithiasis given recent history of travel and decreased hydration .  Starting   flomax.  Advised to suspend initiation of  cipro unless dysuria, nausea or fevers develop.  sent to local pharmacy

## 2021-08-26 ENCOUNTER — Encounter: Payer: Self-pay | Admitting: Gastroenterology

## 2021-08-26 ENCOUNTER — Other Ambulatory Visit: Payer: Self-pay

## 2021-08-26 ENCOUNTER — Ambulatory Visit (INDEPENDENT_AMBULATORY_CARE_PROVIDER_SITE_OTHER): Payer: Self-pay | Admitting: Gastroenterology

## 2021-08-26 VITALS — BP 127/77 | HR 83 | Temp 97.0°F | Ht 63.0 in | Wt 141.6 lb

## 2021-08-26 DIAGNOSIS — K580 Irritable bowel syndrome with diarrhea: Secondary | ICD-10-CM

## 2021-08-26 MED ORDER — DESIPRAMINE HCL 25 MG PO TABS: 25.0000 mg | ORAL_TABLET | Freq: Every day | ORAL | 3 refills | Status: DC

## 2021-08-26 NOTE — Progress Notes (Signed)
Primary Care Physician: Sherlene Shams, MD  Primary Gastroenterologist:  Dr. [name]  Chief Complaint  Patient presents with   Diarrhea    HPI: Maria Kemp is a 65 y.o. female here for diarrhea. The patient reports that she is having diarrhea that usually occurs after she has eaten.  She does not report that any food makes it better or worse and she does not drink milk but reports that she does not bother her. She denies any unexplained weight loss with the diarrhea.  She also reports that when she has diarrhea she has a sense of urgency and some abdominal cramps but no overt pain.  The patient denies any rectal bleeding with the diarrhea.  She was tried on hyoscyamine without any difference noticed.  She also reports that she has had some bloating with the diarrhea.  She has taken Imodium and has reported that the Imodium has helped her.  Past Medical History:  Diagnosis Date   Arthritis    hands   Colon polyps    Hyperlipidemia    Hypertension    PONV (postoperative nausea and vomiting)     Current Outpatient Medications  Medication Sig Dispense Refill   amLODipine (NORVASC) 5 MG tablet TAKE 1 TABLET BY MOUTH DAILY 90 tablet 1   atorvastatin (LIPITOR) 10 MG tablet TAKE 1 TABLET BY MOUTH DAILY 90 tablet 1   Biotin 5 MG CAPS Take 1 capsule by mouth daily.     desipramine (NORPRAMIN) 25 MG tablet Take 1 tablet (25 mg total) by mouth at bedtime. 30 tablet 3   EPINEPHRINE 0.3 mg/0.3 mL IJ SOAJ injection USE AS DIRECTED 2 each 0   fexofenadine (ALLEGRA) 60 MG tablet Take 60 mg by mouth 2 (two) times daily.     fluticasone (FLONASE) 50 MCG/ACT nasal spray Place 1 spray into both nostrils daily.     hyoscyamine (LEVSIN) 0.125 MG tablet Take 1 tablet (0.125 mg total) by mouth every 4 (four) hours as needed. 60 tablet 0   Calcium Carbonate-Vitamin D (CALCIUM-VITAMIN D) 500-200 MG-UNIT tablet Take 1 tablet by mouth daily. (Patient not taking: Reported on 08/26/2021)      Cholecalciferol 25 MCG (1000 UT) tablet Take 1,000 Units by mouth daily. (Patient not taking: Reported on 08/26/2021)     Misc. Devices (ALL-BODY MASSAGE) MISC by Does not apply route. (Patient not taking: Reported on 08/26/2021)     Multiple Vitamin (MULTIVITAMIN) tablet Take by mouth. (Patient not taking: Reported on 08/26/2021)     potassium chloride SA (KLOR-CON) 20 MEQ tablet Take 1 tablet (20 mEq total) by mouth 2 (two) times daily. (Patient not taking: Reported on 08/26/2021) 4 tablet 0   tamsulosin (FLOMAX) 0.4 MG CAPS capsule Take 1 capsule (0.4 mg total) by mouth daily. (Patient not taking: Reported on 08/26/2021) 30 capsule 3   triamcinolone (NASACORT) 55 MCG/ACT AERO nasal inhaler Place into the nose. (Patient not taking: Reported on 08/26/2021)     No current facility-administered medications for this visit.    Allergies as of 08/26/2021 - Review Complete 08/26/2021  Allergen Reaction Noted   Codeine Anaphylaxis 09/04/2014   Shrimp [shellfish allergy] Anaphylaxis 09/04/2014    ROS:  General: Negative for anorexia, weight loss, fever, chills, fatigue, weakness. ENT: Negative for hoarseness, difficulty swallowing , nasal congestion. CV: Negative for chest pain, angina, palpitations, dyspnea on exertion, peripheral edema.  Respiratory: Negative for dyspnea at rest, dyspnea on exertion, cough, sputum, wheezing.  GI: See history of present  illness. GU:  Negative for dysuria, hematuria, urinary incontinence, urinary frequency, nocturnal urination.  Endo: Negative for unusual weight change.    Physical Examination:   BP 127/77 (BP Location: Left Arm, Patient Position: Sitting, Cuff Size: Large)   Pulse 83   Temp (!) 97 F (36.1 C) (Temporal)   Ht 5\' 3"  (1.6 m)   Wt 141 lb 9.6 oz (64.2 kg)   BMI 25.08 kg/m   General: Well-nourished, well-developed in no acute distress.  Eyes: No icterus. Conjunctivae pink. Neuro: Alert and oriented x 3.  Grossly intact. Psych: Alert and  cooperative, normal mood and affect.  Labs:    Imaging Studies: No results found.  Assessment and Plan:   Maria Kemp is a 65 y.o. y/o female who has postprandial diarrhea without any worrisome symptoms such as being woken from asleep with diarrhea or unexplained weight loss.  Is also no report of any GI bleeding. The patient has been explained the pathophysiology of functional bowel disorder with postprandial urgency and diarrhea.  The patient will increase fiber and will also be started on desipramine 25 mg daily at bedtime to see if that helps her symptoms.  If it does not then treating her with Imodium which has been helpful in the past and fiber would be the next recommendation.  The patient has been explained the plan and agrees with it.     70, MD. [name]    Note: This dictation was prepared with Dragon dictation along with smaller phrase technology. Any transcriptional errors that result from this process are unintentional.

## 2021-09-25 ENCOUNTER — Telehealth: Payer: Self-pay | Admitting: *Deleted

## 2021-09-25 NOTE — Telephone Encounter (Signed)
Call returned by Dr. Johnnette Litter

## 2021-09-25 NOTE — Telephone Encounter (Signed)
patient called asking for a return call to discuss some discomfort she is having and if she needs to make an appointment for evaluation [phone]

## 2021-09-27 ENCOUNTER — Telehealth: Payer: Self-pay | Admitting: Internal Medicine

## 2021-09-27 ENCOUNTER — Other Ambulatory Visit: Payer: Self-pay

## 2021-09-27 ENCOUNTER — Other Ambulatory Visit (INDEPENDENT_AMBULATORY_CARE_PROVIDER_SITE_OTHER): Payer: Medicare Other

## 2021-09-27 DIAGNOSIS — R300 Dysuria: Secondary | ICD-10-CM

## 2021-09-27 NOTE — Telephone Encounter (Signed)
Pt has been scheduled.  °

## 2021-09-27 NOTE — Telephone Encounter (Signed)
Please place Dr Metta Clines on the lab schedule today for UA and culture (see mychart message)

## 2021-09-28 LAB — MICROSCOPIC EXAMINATION
Casts: NONE SEEN /lpf
Epithelial Cells (non renal): >10 /hpf — AB (ref 0–10)
RBC, Urine: NONE SEEN /hpf (ref 0–2)

## 2021-09-28 LAB — URINALYSIS, ROUTINE W REFLEX MICROSCOPIC
Bilirubin, UA: NEGATIVE
Glucose, UA: NEGATIVE
Ketones, UA: NEGATIVE
Nitrite, UA: NEGATIVE
Protein,UA: NEGATIVE
RBC, UA: NEGATIVE
Specific Gravity, UA: >=1.03 — AB (ref 1.005–1.030)
Urobilinogen, Ur: 0.2 mg/dL (ref 0.2–1.0)
pH, UA: 5 (ref 5.0–7.5)

## 2021-09-30 LAB — URINE CULTURE

## 2021-10-02 ENCOUNTER — Inpatient Hospital Stay: Payer: Medicare Other | Attending: Obstetrics and Gynecology | Admitting: Obstetrics and Gynecology

## 2021-10-02 ENCOUNTER — Other Ambulatory Visit: Payer: Self-pay

## 2021-10-02 VITALS — BP 133/80 | HR 73 | Temp 97.8°F | Resp 20 | Wt 140.0 lb

## 2021-10-02 DIAGNOSIS — R300 Dysuria: Secondary | ICD-10-CM | POA: Diagnosis not present

## 2021-10-02 DIAGNOSIS — N950 Postmenopausal bleeding: Secondary | ICD-10-CM | POA: Insufficient documentation

## 2021-10-02 DIAGNOSIS — R102 Pelvic and perineal pain: Secondary | ICD-10-CM | POA: Diagnosis not present

## 2021-10-02 MED ORDER — FLUCONAZOLE 150 MG PO TABS: ORAL_TABLET | ORAL | 0 refills | Status: DC

## 2021-10-02 NOTE — Progress Notes (Signed)
Gynecologic Oncology Visit   Referring Provider: Dr. Arvil Chaco  PCP: Dr. Darrick Huntsman  Chief Concern: abnormal Pap smears and VAIN1.  PAP with endometrial cells. Vaginal pain  Subjective:  Maria Kemp is a 65 y.o. female who has a long history of slightly abnormal Pap smears, mainly LGSIL, often negative biopsies after colposcopy. 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 She returns to clinic today for new complaints of vaginal pain and spotting.   Symptoms occurred intermittently while at a conference. She experienced dysuria and flank pain. She had UA with mixed genital flora on urine culture. Symptoms recurred a week ago.. she saw her pcp who repeated UA and culture with similar results. She did have yeast on microscopy. Symptoms have now resolved.     Gynecologic History:  Dr Arvil Chaco performed LEEP in 1999 and CKC in 2006 for cervical dysplasia.  Subsequent Pap smears vary between normal and LGSIL with high risk HPV.  Since 2013 her q6 month PAPs have shown ASCUS, Normal, LSIL, LSIL.  No HPV testing or colposcopy was done during that time period.  10/2014 she saw Dr. Johnnette Litter. Colposcopy was performed. The transformation zone was not visible, but there were no lesions. Pap NILM and HR-HPV negative.   She was seen by Dr Johnnette Litter 12/16 for colposcopy given LSIL cervical PAP, Vaginal Pap: ASCUS, negative HR HPV on 11/16. Biopsy of small cyst in the vagina just below the cervix was performed.  DIAGNOSIS:  A. VAGINA; BIOPSY:  - INFLAMED VAGINAL MUCOSA WITH A LOW GRADE SQUAMOUS INTRAEPITHELIAL LESION (VAIN 1) AND PARTIAL BENIGN CYST.   12/17- Pap/HPV normal  06/17/17- LSIL PAP/HPV Positive  03/17/2018 Colposcopy vagina and cervix, negative.  03/17/2018- Pap NILM, HRHPV negative; Colposcopy negative 05/25/2019- Pap LSIL, HR HPV negative.   02/09/2020 pelvic US was obtained for unexplained endometrial cells on Pap smear.  IMPRESSION: 1. Endometrial stripe measures within normal limits at 2 mm in  maximal thickness. 2. 5 mm simple cystic lesion seen close proximity to the proximal endometrial stripe, indeterminate, but could reflect a small subendometrial cyst versus small amount of trapped simple fluid within the endometrial cavity. 3. Normal right ovary. 4. Nonvisualization of the left ovary. No adnexal mass or free fluid.   01/04/2020- Pap LSIL, HPV negative. Epithelial cell abnormality Her most recent pap 1/27/21showed LSIL and HPV negative. However, endometrial cells were present and we discussed that while this can be benign or due to polyp, endometrial abnormalities including endometrial hyperplasia and/or carcinoma can't be ruled out and Dr. Johnnette Litter recommended endometrial biopsy to further evaluation.    She had an episode of SVT on 06/09/2019 that responded to adenosine. She started maintenance metoprolol.   At her last visit she had an endometrial biopsy on 02/01/2020 pathology revealed tissue insufficient for diagnosis.   Her mother and father passed away. Her mother was diagnosed with carcinosarcoma.   Problem List: Patient Active Problem List   Diagnosis Date Noted   Flank pain 08/22/2021   Sore throat in the morning 07/02/2021   Prediabetes 02/07/2021   Decreased GFR 02/07/2021   Adjustment disorder with disturbance of emotion 02/07/2021   Atypical chest pain 02/05/2021   Change in bowel habits 02/05/2021   Abnormal mammogram with microcalcification 01/29/2021   Encounter for preventive health examination 08/02/2020   Right shoulder injury, initial encounter 08/02/2020   Bilateral cataracts 10/12/2018   Cyst of joint of hand, right 10/12/2018   Allergic conjunctivitis 03/02/2018   Breast nodule 03/22/2017   Vaginal  dysplasia 05/21/2016   Low grade squamous intraepithelial lesion on cytologic smear of cervix (LGSIL) 05/21/2016   History of colonic polyps 03/17/2016   H/O sebaceous cyst 09/06/2015   Hyperlipidemia LDL goal <130 09/06/2015   Lichen planus  09/04/2014   Essential hypertension, benign 09/04/2014   Family history of colonic polyps 09/04/2014   H/O rotator cuff tear 09/04/2014   Past Medical History: Past Medical History:  Diagnosis Date   Arthritis    hands   Colon polyps    Hyperlipidemia    Hypertension    PONV (postoperative nausea and vomiting)    Past Surgical History: Past Surgical History:  Procedure Laterality Date   ABDOMINOPLASTY  1995   CATARACT EXTRACTION, BILATERAL     CESAREAN SECTION  1989   COLONOSCOPY WITH PROPOFOL N/A 11/29/2018   Procedure: COLONOSCOPY WITH PROPOFOL;  Surgeon: [name], MD;  Location: [facility];  Service: Endoscopy;  Laterality: N/A;   POLYPECTOMY N/A 11/29/2018   Procedure: POLYPECTOMY;  Surgeon: [name], MD;  Location: [facility];  Service: Endoscopy;  Laterality: N/A;   Past Gynecologic History:  Menarche: 11 Menstrual details: postmenopausal  OB History: G3P2  Family History: Family History  Problem Relation Age of Onset   Arthritis Mother    Hyperlipidemia Mother    Hypertension Mother    Heart disease Mother    Uterine cancer Mother    Arthritis Father    Cancer Father    Hyperlipidemia Father    Hypertension Father    Heart disease Father    Diabetes Father    Hyperlipidemia Brother    Hypertension Brother    Social History: Social History   Socioeconomic History   Marital status: Divorced    Spouse name: Not on file   Number of children: Not on file   Years of education: Not on file   Highest education level: Professional school degree (e.g., MD, DDS, DVM, JD)  Occupational History   Not on file  Tobacco Use   Smoking status: Never   Smokeless tobacco: Never  Vaping Use   Vaping Use: Never used  Substance and Sexual Activity   Alcohol use: Yes    Comment: rarely - 2-3x/yr   Drug use: No   Sexual activity: Not on file  Other Topics Concern   Not on file  Social History Narrative   Not on file   Social  Determinants of Health   Financial Resource Strain: Low Risk    Difficulty of Paying Living Expenses: Not hard at all  Food Insecurity: No Food Insecurity   Worried About Programme researcher, broadcasting/film/video in the Last Year: Never true   Ran Out of Food in the Last Year: Never true  Transportation Needs: No Transportation Needs   Lack of Transportation (Medical): No   Lack of Transportation (Non-Medical): No  Physical Activity: Insufficiently Active   Days of Exercise per Week: 3 days   Minutes of Exercise per Session: 20 min  Stress: No Stress Concern Present   Feeling of Stress : Not at all  Social Connections: Moderately Integrated   Frequency of Communication with Friends and Family: More than three times a week   Frequency of Social Gatherings with Friends and Family: Once a week   Attends Religious Services: More than 4 times per year   Active Member of [employer] or Organizations: Yes   Attends Engineer, structural: More than 4 times per year   Marital Status: Divorced  Catering manager Violence:  Not on file   Allergies: Allergies  Allergen Reactions   Codeine Anaphylaxis   Shrimp [Shellfish Allergy] Anaphylaxis    All shell fish   Current Medications: Current Outpatient Medications  Medication Sig Dispense Refill   amLODipine (NORVASC) 5 MG tablet TAKE 1 TABLET BY MOUTH DAILY 90 tablet 1   atorvastatin (LIPITOR) 10 MG tablet TAKE 1 TABLET BY MOUTH DAILY 90 tablet 1   EPINEPHRINE 0.3 mg/0.3 mL IJ SOAJ injection USE AS DIRECTED 2 each 0   fexofenadine (ALLEGRA) 60 MG tablet Take 60 mg by mouth 2 (two) times daily.     fluticasone (FLONASE) 50 MCG/ACT nasal spray Place 1 spray into both nostrils daily.     Biotin 5 MG CAPS Take 1 capsule by mouth daily. (Patient not taking: Reported on 10/02/2021)     Calcium Carbonate-Vitamin D (CALCIUM-VITAMIN D) 500-200 MG-UNIT tablet Take 1 tablet by mouth daily. (Patient not taking: No sig reported)     Cholecalciferol 25 MCG (1000 UT) tablet  Take 1,000 Units by mouth daily. (Patient not taking: Reported on 08/26/2021)     desipramine (NORPRAMIN) 25 MG tablet Take 1 tablet (25 mg total) by mouth at bedtime. 30 tablet 3   hyoscyamine (LEVSIN) 0.125 MG tablet Take 1 tablet (0.125 mg total) by mouth every 4 (four) hours as needed. (Patient not taking: Reported on 10/02/2021) 60 tablet 0   Misc. Devices (ALL-BODY MASSAGE) MISC by Does not apply route. (Patient not taking: No sig reported)     Multiple Vitamin (MULTIVITAMIN) tablet Take by mouth. (Patient not taking: No sig reported)     potassium chloride SA (KLOR-CON) 20 MEQ tablet Take 1 tablet (20 mEq total) by mouth 2 (two) times daily. (Patient not taking: No sig reported) 4 tablet 0   tamsulosin (FLOMAX) 0.4 MG CAPS capsule Take 1 capsule (0.4 mg total) by mouth daily. (Patient not taking: No sig reported) 30 capsule 3   triamcinolone (NASACORT) 55 MCG/ACT AERO nasal inhaler Place into the nose. (Patient not taking: No sig reported)     No current facility-administered medications for this visit.   Review of Systems General:  no complaints Skin: no complaints Eyes: no complaints HEENT: no complaints Breasts: no complaints Pulmonary: no complaints Cardiac: no complaints Gastrointestinal: no complaints Genitourinary/Sexual: no complaints Ob/Gyn: no complaints Musculoskeletal: no complaints Hematology: no complaints Neurologic/Psych: no complaints   Objective:  Physical Examination:  Today's Vitals   10/02/21 1329  BP: 133/80  Pulse: 73  Resp: 20  Temp: 97.8 F (36.6 C)  SpO2: 100%  Weight: 140 lb (63.5 kg)   Body mass index is 24.8 kg/m.  GENERAL: Patient is a well appearing female in no acute distress ABDOMEN:  Soft, nontender.  No CVA tenderness SKIN:  Clear with no obvious rashes or skin changes.  NEURO:  Nonfocal. Well oriented.  Appropriate affect.  Pelvic: chaperoned by NP EGBUS: no lesions Cervix: no lesions, nontender, mobile Vagina: atrophic. No  lesions, discharge, or bleeding  Uterus: normal size, nontender, mobile Adnexa: no palpable masses Rectovaginal: deferred       Assessment:  Maria Kemp is a 65 y.o. 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 Cervical os stenosis. LSIL Pap smear with positive HPV 06/17/2017. Negative colposcopy with negative Pap/HRHPV 4/19 and 6/20. 01/04/2020- Pap LSIL, HPV negative. Epithelial cell abnormality. Endometrial cells noted on PAP and this is abnormal in a post menopausal woman.  Endometrial biopsy, insufficient tissue.  Pap 01/16/21 NILM, HPV negative. Presents for vaginal burning and dysuria. UA from PCP revealed yeast. Exam consistent with mild atrophy.   Family history of carcinosarcoma, mother in her 41's   Plan:   Problem List Items Addressed This Visit   None Visit Diagnoses     Dysuria    -  Primary       Urine culture consistent with skin contamination. Did not grow sufficient bacteria to treat with antibiotics. UA did reveal yeast. Unclear if contaminant but will treat with diflucan 150 mg on day one and repeat in 3 days. If symptoms persist, consider trial of topical estrogen.   Follow up as scheduled for hx of vaginal dysplasia.   Consuello Masse, DNP, AGNP-[facility] [phone] (clinic)  I personally interviewed and examined the patient. Agreed with the above/below plan of care. I have directly contributed to assessment and plan of care of this patient and educated and discussed with patient and family.  Leida Lauth, MD

## 2021-10-02 NOTE — Progress Notes (Signed)
Patient states in September she had pain with urination and her pcp said she didn't think she it was a uti and it went away and then a week ago she says now she starting to have lighting pain and wants to know what's going on

## 2021-10-30 ENCOUNTER — Ambulatory Visit: Payer: Medicare Other

## 2021-11-18 ENCOUNTER — Ambulatory Visit (INDEPENDENT_AMBULATORY_CARE_PROVIDER_SITE_OTHER): Payer: Medicare Other | Admitting: Internal Medicine

## 2021-11-18 ENCOUNTER — Other Ambulatory Visit: Payer: Self-pay

## 2021-11-18 ENCOUNTER — Encounter: Payer: Self-pay | Admitting: Internal Medicine

## 2021-11-18 VITALS — BP 112/74 | HR 74 | Temp 96.5°F | Ht 63.0 in | Wt 140.8 lb

## 2021-11-18 DIAGNOSIS — E785 Hyperlipidemia, unspecified: Secondary | ICD-10-CM

## 2021-11-18 DIAGNOSIS — F4329 Adjustment disorder with other symptoms: Secondary | ICD-10-CM | POA: Diagnosis not present

## 2021-11-18 DIAGNOSIS — I10 Essential (primary) hypertension: Secondary | ICD-10-CM

## 2021-11-18 DIAGNOSIS — R7303 Prediabetes: Secondary | ICD-10-CM

## 2021-11-18 NOTE — Patient Instructions (Signed)
No changes today  May the Lord give you peace and joy during this holiday season, and may the promise of His return bring you comfort and hope for the future.  Regards,   Duncan Dull, MD

## 2021-11-18 NOTE — Progress Notes (Signed)
Subjective:  Patient ID: Maria Kemp, female    DOB: Apr 01, 1956  Age: 65 y.o. MRN: [id number]  CC: The primary encounter diagnosis was Essential hypertension, benign. Diagnoses of Prediabetes, Hyperlipidemia LDL goal <130, and Adjustment disorder with disturbance of emotion were also pertinent to this visit.  HPI Maria Kemp presents for  Chief Complaint  Patient presents with   Follow-up    4 month follow up     This visit occurred during the SARS-CoV-2 public health emergency.  Safety protocols were in place, including screening questions prior to the visit, additional usage of staff PPE, and extensive cleaning of exam room while observing appropriate contact time as indicated for disinfecting solutions.   1) Hypertension: patient checks blood pressure twice weekly at home.  Readings have been for the most part < 130/80 at rest . Patient is following a reduced salt diet most days and is taking amlodipine  as prescribed .  2) hyperlipidemia  tolerating atorvastatin  3) IBS: managed with dietary restrictions after evaluation by Dr Allen Norris.  4) Stress: family,  career.  Her Parents passed last year. And lived with her until their death.  Brother was not actively  involved in their care, some estate issues have become a source of ongoing conflict.   finished reading the [employer]19 project .  Took her several weeks.  Graeful for her family's stewardship and insistence on education.  It has changed her outlook on the nation.       Outpatient Medications Prior to Visit  Medication Sig Dispense Refill   amLODipine (NORVASC) 5 MG tablet TAKE 1 TABLET BY MOUTH DAILY 90 tablet 1   atorvastatin (LIPITOR) 10 MG tablet TAKE 1 TABLET BY MOUTH DAILY 90 tablet 1   EPINEPHRINE 0.3 mg/0.3 mL IJ SOAJ injection USE AS DIRECTED 2 each 0   fexofenadine (ALLEGRA) 60 MG tablet Take 60 mg by mouth 2 (two) times daily.     fluticasone (FLONASE) 50 MCG/ACT nasal spray Place 1 spray into both nostrils daily.      Multiple Vitamin (MULTIVITAMIN) tablet Take by mouth.     Biotin 5 MG CAPS Take 1 capsule by mouth daily. (Patient not taking: Reported on 10/02/2021)     Calcium Carbonate-Vitamin D (CALCIUM-VITAMIN D) 500-200 MG-UNIT tablet Take 1 tablet by mouth daily. (Patient not taking: Reported on 08/26/2021)     Cholecalciferol 25 MCG (1000 UT) tablet Take 1,000 Units by mouth daily. (Patient not taking: Reported on 08/26/2021)     desipramine (NORPRAMIN) 25 MG tablet Take 1 tablet (25 mg total) by mouth at bedtime. (Patient not taking: Reported on 11/18/2021) 30 tablet 3   fluconazole (DIFLUCAN) 150 MG tablet Take 1 tablet (150 mg) by mouth on day 1. Three days later, repeat. (Patient not taking: Reported on 11/18/2021) 2 tablet 0   hyoscyamine (LEVSIN) 0.125 MG tablet Take 1 tablet (0.125 mg total) by mouth every 4 (four) hours as needed. (Patient not taking: Reported on 10/02/2021) 60 tablet 0   Misc. Devices (ALL-BODY MASSAGE) MISC by Does not apply route. (Patient not taking: Reported on 08/26/2021)     potassium chloride SA (KLOR-CON) 20 MEQ tablet Take 1 tablet (20 mEq total) by mouth 2 (two) times daily. (Patient not taking: Reported on 08/26/2021) 4 tablet 0   tamsulosin (FLOMAX) 0.4 MG CAPS capsule Take 1 capsule (0.4 mg total) by mouth daily. (Patient not taking: Reported on 08/26/2021) 30 capsule 3   triamcinolone (NASACORT) 55 MCG/ACT AERO nasal inhaler Place  into the nose. (Patient not taking: Reported on 08/26/2021)     No facility-administered medications prior to visit.    Review of Systems;  Patient denies headache, fevers, malaise, unintentional weight loss, skin rash, eye pain, sinus congestion and sinus pain, sore throat, dysphagia,  hemoptysis , cough, dyspnea, wheezing, chest pain, palpitations, orthopnea, edema, abdominal pain, nausea, melena, diarrhea, constipation, flank pain, dysuria, hematuria, urinary  Frequency, nocturia, numbness, tingling, seizures,  Focal weakness, Loss of  consciousness,  Tremor, insomnia, depression, anxiety, and suicidal ideation.      Objective:  BP 112/74 (BP Location: Left Arm, Patient Position: Sitting, Cuff Size: Normal)   Pulse 74   Temp (!) 96.5 F (35.8 C) (Temporal)   Ht 5\' 3"  (1.6 m)   Wt 140 lb 12.8 oz (63.9 kg)   SpO2 96%   BMI 24.94 kg/m   BP Readings from Last 3 Encounters:  11/18/21 112/74  10/02/21 133/80  08/26/21 127/77    Wt Readings from Last 3 Encounters:  11/18/21 140 lb 12.8 oz (63.9 kg)  10/02/21 140 lb (63.5 kg)  08/26/21 141 lb 9.6 oz (64.2 kg)    General appearance: alert, cooperative and appears stated age Ears: normal TM's and external ear canals both ears Throat: lips, mucosa, and tongue normal; teeth and gums normal Neck: no adenopathy, no carotid bruit, supple, symmetrical, trachea midline and thyroid not enlarged, symmetric, no tenderness/mass/nodules Back: symmetric, no curvature. ROM normal. No CVA tenderness. Lungs: clear to auscultation bilaterally Heart: regular rate and rhythm, S1, S2 normal, no murmur, click, rub or gallop Abdomen: soft, non-tender; bowel sounds normal; no masses,  no organomegaly Pulses: 2+ and symmetric Skin: Skin color, texture, turgor normal. No rashes or lesions Lymph nodes: Cervical, supraclavicular, and axillary nodes normal.  Lab Results  Component Value Date   HGBA1C 5.9 02/06/2021   HGBA1C 6.2 08/06/2020   HGBA1C 6.1 05/11/2019    Lab Results  Component Value Date   CREATININE 0.87 02/13/2021   CREATININE 1.18 02/06/2021   CREATININE 0.76 08/06/2020    Lab Results  Component Value Date   WBC 3.4 (L) 08/06/2020   HGB 13.4 08/06/2020   HCT 39.9 08/06/2020   PLT 354.0 08/06/2020   GLUCOSE 116 (H) 02/13/2021   CHOL 163 02/06/2021   TRIG 74.0 02/06/2021   HDL 54.50 02/06/2021   LDLDIRECT 90.0 03/05/2016   LDLCALC 94 02/06/2021   ALT 17 02/06/2021   AST 17 02/06/2021   NA 141 02/13/2021   K 3.1 (L) 02/13/2021   CL 103 02/13/2021    CREATININE 0.87 02/13/2021   BUN 13 02/13/2021   CO2 30 02/13/2021   TSH 0.50 02/06/2021   HGBA1C 5.9 02/06/2021   MICROALBUR 2.6 (H) 05/11/2017    MR FINGERS RICHT WO CONTRAST  Result Date: 09/04/2020 CLINICAL DATA:  Right thumb mass present for greater than 1 year EXAM: MRI OF THE RIGHT FINGERS WITHOUT CONTRAST TECHNIQUE: Multiplanar, multisequence MR imaging of the right thumb was performed. No intravenous contrast was administered. COMPARISON:  None available FINDINGS: Bones/Joint/Cartilage No acute fracture. No dislocation. No bone marrow edema. No suspicious marrow replacing lesion. Mild osteoarthritis of the first MCP joint and interphalangeal joint of the thumb. No joint effusion. Ligaments Collateral ligaments of the thumb are intact. Muscles and Tendons Intact flexor and extensor tendons. No tenosynovitis. Normal muscle bulk and signal intensity. Soft tissues There is a solid T1/T2 heterogeneously hypointense mass within the volar soft tissues of the right thumb adjacent to the ulnar aspect of  the flexor tendon just proximal to the thumb IP joint. Mass measures 1.0 x 0.7 x 1.2 cm (series 12, image 11; series 9, image 16). No erosion to the underlying proximal phalanx. No additional soft tissue masses. No associated soft tissue edema. No cystic lesions or fluid collections. IMPRESSION: 1. Well-circumscribed 1.2 cm solid mass within the volar soft tissues of the right thumb adjacent to the flexor tendon just proximal to the thumb IP joint. Imaging characteristics are most suggestive of a giant cell tumor of the tendon sheath. 2. Mild osteoarthritis of the first MCP joint and interphalangeal joint of the thumb. Electronically Signed   By: Davina Poke D.O.   On: 09/04/2020 10:35    Assessment & Plan:   Problem List Items Addressed This Visit     Prediabetes    Improved risk of diabetes noted , with lowering of a1c to 5.9   Lab Results  Component Value Date   HGBA1C 5.9 02/06/2021           Relevant Orders   Comprehensive metabolic panel   Microalbumin / creatinine urine ratio   Hemoglobin A1c   Hyperlipidemia LDL goal <130    Managed with atorvastatin LDL goal < 100.  LFTs are needed   Lab Results  Component Value Date   CHOL 163 02/06/2021   HDL 54.50 02/06/2021   LDLCALC 94 02/06/2021   LDLDIRECT 90.0 03/05/2016   TRIG 74.0 02/06/2021   CHOLHDL 3 02/06/2021   Lab Results  Component Value Date   ALT 17 02/06/2021   AST 17 02/06/2021   ALKPHOS 78 02/06/2021   BILITOT 0.3 02/06/2021          Relevant Orders   Lipid panel   Essential hypertension, benign - Primary    Well controlled on current regimen. Renal function stable, no changes today.      Relevant Orders   Comprehensive metabolic panel   Microalbumin / creatinine urine ratio   Adjustment disorder with disturbance of emotion    seconday to the unexpected loss of  Both parents whom she cared for in her home until their death  She is coping well and has adequate  emotional support .           I provided  30 minutes of  face-to-face time during this encounter reviewing patient's current problems and past surgeries, labs and imaging studies, providing counseling on the above mentioned problems , and coordination  of care .   Follow-up: No follow-ups on file.   Crecencio Mc, MD

## 2021-11-19 LAB — LIPID PANEL
Cholesterol: 171 mg/dL (ref 0–200)
HDL: 61.6 mg/dL (ref >39.00)
LDL Cholesterol: 92 mg/dL (ref 0–99)
NonHDL: 108.95
Total CHOL/HDL Ratio: 3
Triglycerides: 83 mg/dL (ref 0.0–149.0)
VLDL: 16.6 mg/dL (ref 0.0–40.0)

## 2021-11-19 LAB — COMPREHENSIVE METABOLIC PANEL
ALT: 18 U/L (ref 0–35)
AST: 18 U/L (ref 0–37)
Albumin: 4.3 g/dL (ref 3.5–5.2)
Alkaline Phosphatase: 77 U/L (ref 39–117)
BUN: 14 mg/dL (ref 6–23)
CO2: 29 mEq/L (ref 19–32)
Calcium: 9.4 mg/dL (ref 8.4–10.5)
Chloride: 103 mEq/L (ref 96–112)
Creatinine, Ser: 0.74 mg/dL (ref 0.40–1.20)
GFR: 84.39 mL/min (ref >60.00)
Glucose, Bld: 85 mg/dL (ref 70–99)
Potassium: 3.6 mEq/L (ref 3.5–5.1)
Sodium: 140 mEq/L (ref 135–145)
Total Bilirubin: 0.4 mg/dL (ref 0.2–1.2)
Total Protein: 7.1 g/dL (ref 6.0–8.3)

## 2021-11-19 LAB — MICROALBUMIN / CREATININE URINE RATIO
Creatinine,U: 143 mg/dL
Microalb Creat Ratio: 1.1 mg/g (ref 0.0–30.0)
Microalb, Ur: 1.6 mg/dL (ref 0.0–1.9)

## 2021-11-19 LAB — HEMOGLOBIN A1C: Hgb A1c MFr Bld: 6.2 % (ref 4.6–6.5)

## 2021-11-19 NOTE — Assessment & Plan Note (Signed)
Improved risk of diabetes noted , with lowering of a1c to 5.9   Lab Results  Component Value Date   HGBA1C 5.9 02/06/2021

## 2021-11-19 NOTE — Assessment & Plan Note (Signed)
Well controlled on current regimen. Renal function stable, no changes today. 

## 2021-11-19 NOTE — Assessment & Plan Note (Signed)
seconday to the unexpected loss of  Both parents whom she cared for in her home until their death  She is coping well and has adequate  emotional support .

## 2021-11-19 NOTE — Assessment & Plan Note (Signed)
Managed with atorvastatin LDL goal < 100.  LFTs are needed   Lab Results  Component Value Date   CHOL 163 02/06/2021   HDL 54.50 02/06/2021   LDLCALC 94 02/06/2021   LDLDIRECT 90.0 03/05/2016   TRIG 74.0 02/06/2021   CHOLHDL 3 02/06/2021   Lab Results  Component Value Date   ALT 17 02/06/2021   AST 17 02/06/2021   ALKPHOS 78 02/06/2021   BILITOT 0.3 02/06/2021

## 2021-12-07 ENCOUNTER — Other Ambulatory Visit: Payer: Self-pay | Admitting: Internal Medicine

## 2022-01-22 ENCOUNTER — Ambulatory Visit: Payer: PRIVATE HEALTH INSURANCE

## 2022-02-05 ENCOUNTER — Inpatient Hospital Stay: Payer: Medicare Other

## 2022-02-13 ENCOUNTER — Telehealth: Payer: Self-pay | Admitting: Obstetrics and Gynecology

## 2022-02-13 NOTE — Telephone Encounter (Signed)
Pt is wanting to know if she can see you on 4/19 you are book for the afternoon and she doesn't really want to see but you ,can I double book you or not. Please advise next appt for you is 5/10? ?

## 2022-02-14 NOTE — Telephone Encounter (Signed)
Im sorry I was meaning to put Dr. Johnnette Litter name that is who she wants to see. Stated he would work her in cause she is at [facility] on WED afternoons. He is full that day and next time he here is in May. ?

## 2022-02-26 ENCOUNTER — Telehealth: Payer: Self-pay | Admitting: Internal Medicine

## 2022-02-26 NOTE — Telephone Encounter (Signed)
Attempted to schedule AWV. Unable to LVM.  Will try at later time.  

## 2022-03-12 ENCOUNTER — Ambulatory Visit: Payer: Medicare Other

## 2022-03-26 ENCOUNTER — Inpatient Hospital Stay: Payer: Medicare Other | Attending: Obstetrics and Gynecology | Admitting: Obstetrics and Gynecology

## 2022-03-26 ENCOUNTER — Other Ambulatory Visit: Payer: Self-pay | Admitting: Nurse Practitioner

## 2022-03-26 VITALS — BP 121/75 | HR 82 | Resp 19 | Wt 139.4 lb

## 2022-03-26 DIAGNOSIS — N893 Dysplasia of vagina, unspecified: Secondary | ICD-10-CM | POA: Diagnosis present

## 2022-03-26 NOTE — Progress Notes (Signed)
Gynecologic Oncology Visit  ? ?Referring Provider: Dr. Ammie Dalton ? ?PCP: Dr. Derrel Nip ? ?Chief Concern: abnormal Pap smears and VAIN1.  PAP with endometrial cells. Vaginal pain ? ?Subjective:  ?Maria Kemp is a 66 y.o. female who has a long history of slightly abnormal Pap smears, mainly LGSIL, often negative biopsies after colposcopy. 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  ? ?Doing well.  No complaints.  No discharge or bleeding.  ? ?She experienced dysuria and flank pain 10/23. She had UA with mixed genital flora on urine culture. She saw her pcp who repeated UA and culture with similar results. She did have yeast on microscopy. Symptoms resolved.  ? ?Gynecologic History:  ?Dr Ammie Dalton performed LEEP in 1999 and [place] in 2006 for cervical dysplasia.  Subsequent Pap smears vary between normal and LGSIL with high risk HPV. ? ?Since 2013 her q6 month PAPs have shown ASCUS, Normal, LSIL, LSIL.  No HPV testing or colposcopy was done during that time period. ? ?10/2014 she saw Dr. Fransisca Connors. Colposcopy was performed. The transformation zone was not visible, but there were no lesions. Pap NILM and HR-HPV negative.  ? ?She was seen by Dr Fransisca Connors 12/16 for colposcopy given LSIL cervical PAP, Vaginal Pap: ASCUS, negative HR HPV on 11/16. Biopsy of small cyst in the vagina just below the cervix was performed. ? ?DIAGNOSIS:  ?A. VAGINA; BIOPSY:  ?- INFLAMED VAGINAL MUCOSA WITH A LOW GRADE SQUAMOUS INTRAEPITHELIAL LESION (VAIN 1) AND PARTIAL BENIGN CYST.  ? ?12/17- Pap/HPV normal  ?06/17/17- LSIL PAP/HPV Positive  ?03/17/2018 Colposcopy vagina and cervix, negative.  ?03/17/2018- Pap NILM, HRHPV negative; Colposcopy negative ?05/25/2019- Pap LSIL, HR HPV negative.  ? ?02/09/2020 pelvic US was obtained for unexplained endometrial cells on Pap smear.  ?IMPRESSION: ?1. Endometrial stripe measures within normal limits at 2 mm in maximal thickness. ?2. 5 mm simple cystic lesion seen close proximity to the proximal endometrial stripe,  indeterminate, but could reflect a small ?subendometrial cyst versus small amount of trapped simple fluid within the endometrial cavity. ?3. Normal right ovary. ?4. Nonvisualization of the left ovary. No adnexal mass or free fluid. ?  ?01/04/2020- Pap LSIL, HPV negative. Epithelial cell abnormality ?Her most recent pap 1/27/21showed LSIL and HPV negative. However, endometrial cells were present and we discussed that while this can be benign or due to polyp, endometrial abnormalities including endometrial hyperplasia and/or carcinoma can't be ruled out and Dr. Fransisca Connors recommended endometrial biopsy to further evaluation.   ? ?She had an episode of SVT on 06/09/2019 that responded to adenosine. She started maintenance metoprolol.  ? ?At her last visit she had an endometrial biopsy on 02/01/2020 pathology revealed tissue insufficient for diagnosis.  ? ?Her mother and father passed away. Her mother was diagnosed with carcinosarcoma.  ? ?Problem List: ?Patient Active Problem List  ? Diagnosis Date Noted  ? Flank pain 08/22/2021  ? Sore throat in the morning 07/02/2021  ? Prediabetes 02/07/2021  ? Decreased GFR 02/07/2021  ? Adjustment disorder with disturbance of emotion 02/07/2021  ? Atypical chest pain 02/05/2021  ? Change in bowel habits 02/05/2021  ? Abnormal mammogram with microcalcification 01/29/2021  ? Encounter for preventive health examination 08/02/2020  ? Right shoulder injury, initial encounter 08/02/2020  ? Bilateral cataracts 10/12/2018  ? Cyst of joint of hand, right 10/12/2018  ? Allergic conjunctivitis 03/02/2018  ? Breast nodule 03/22/2017  ? Vaginal dysplasia 05/21/2016  ? Low grade squamous intraepithelial lesion on cytologic smear of cervix (LGSIL) 05/21/2016  ?  History of colonic polyps 03/17/2016  ? H/O sebaceous cyst 09/06/2015  ? Hyperlipidemia LDL goal <130 09/06/2015  ? Lichen planus 0000000  ? Essential hypertension, benign 09/04/2014  ? Family history of colonic polyps 09/04/2014  ? H/O  rotator cuff tear 09/04/2014  ? ?Past Medical History: ?Past Medical History:  ?Diagnosis Date  ? Arthritis   ? hands  ? Colon polyps   ? Hyperlipidemia   ? Hypertension   ? PONV (postoperative nausea and vomiting)   ? ?Past Surgical History: ?Past Surgical History:  ?Procedure Laterality Date  ? ABDOMINOPLASTY  1995  ? CATARACT EXTRACTION, BILATERAL    ? [place]  ? COLONOSCOPY WITH PROPOFOL N/A 11/29/2018  ? Procedure: COLONOSCOPY WITH PROPOFOL;  Surgeon: Lucilla Lame, MD;  Location: [place];  Service: Endoscopy;  Laterality: N/A;  ? POLYPECTOMY N/A 11/29/2018  ? Procedure: POLYPECTOMY;  Surgeon: Lucilla Lame, MD;  Location: [place];  Service: Endoscopy;  Laterality: N/A;  ? ?Past Gynecologic History:  ?Menarche: 11 ?Menstrual details: postmenopausal ? ?OB History: G3P2 ? ?Family History: ?Family History  ?Problem Relation Age of Onset  ? Arthritis Mother   ? Hyperlipidemia Mother   ? Hypertension Mother   ? Heart disease Mother   ? Uterine cancer Mother   ? Arthritis Father   ? Cancer Father   ? Hyperlipidemia Father   ? Hypertension Father   ? Heart disease Father   ? Diabetes Father   ? Hyperlipidemia Brother   ? Hypertension Brother   ? ?Social History: ?Social History  ? ?Socioeconomic History  ? Marital status: Divorced  ?  Spouse name: Not on file  ? Number of children: Not on file  ? Years of education: Not on file  ? Highest education level: Professional school degree (e.g., MD, DDS, DVM, JD)  ?Occupational History  ? Not on file  ?Tobacco Use  ? Smoking status: Never  ? Smokeless tobacco: Never  ?Vaping Use  ? Vaping Use: Never used  ?Substance and Sexual Activity  ? Alcohol use: Yes  ?  Comment: rarely - 2-3x/yr  ? Drug use: No  ? Sexual activity: Not on file  ?Other Topics Concern  ? Not on file  ?Social History Narrative  ? Not on file  ? ?Social Determinants of Health  ? ?Financial Resource Strain: Low Risk   ? Difficulty of Paying Living Expenses: Not hard at  all  ?Food Insecurity: No Food Insecurity  ? Worried About Charity fundraiser in the Last Year: Never true  ? Ran Out of Food in the Last Year: Never true  ?Transportation Needs: No Transportation Needs  ? Lack of Transportation (Medical): No  ? Lack of Transportation (Non-Medical): No  ?Physical Activity: Insufficiently Active  ? Days of Exercise per Week: 3 days  ? Minutes of Exercise per Session: 20 min  ?Stress: No Stress Concern Present  ? Feeling of Stress : Not at all  ?Social Connections: Moderately Integrated  ? Frequency of Communication with Friends and Family: More than three times a week  ? Frequency of Social Gatherings with Friends and Family: Once a week  ? Attends Religious Services: More than 4 times per year  ? Active Member of Clubs or Organizations: Yes  ? Attends Archivist Meetings: More than 4 times per year  ? Marital Status: Divorced  ?Intimate Partner Violence: Not on file  ? ?Allergies: ?Allergies  ?Allergen Reactions  ? Codeine Anaphylaxis  ? Shrimp [employer]  Allergy] Anaphylaxis  ?  All shell fish  ? ?Current Medications: ?Current Outpatient Medications  ?Medication Sig Dispense Refill  ? amLODipine (NORVASC) 5 MG tablet TAKE 1 TABLET BY MOUTH DAILY 90 tablet 3  ? atorvastatin (LIPITOR) 10 MG tablet TAKE 1 TABLET BY MOUTH DAILY 90 tablet 3  ? EPINEPHRINE 0.3 mg/0.3 mL IJ SOAJ injection USE AS DIRECTED 2 each 0  ? fexofenadine (ALLEGRA) 60 MG tablet Take 60 mg by mouth 2 (two) times daily.    ? fluticasone (FLONASE) 50 MCG/ACT nasal spray Place 1 spray into both nostrils daily.    ? Multiple Vitamin (MULTIVITAMIN) tablet Take by mouth. (Patient not taking: Reported on 03/26/2022)    ? ?No current facility-administered medications for this visit.  ? ?Review of Systems ?General:  no complaints ?Skin: no complaints ?Eyes: no complaints ?HEENT: no complaints ?Breasts: no complaints ?Pulmonary: no complaints ?Cardiac: no complaints ?Gastrointestinal: no  complaints ?Genitourinary/Sexual: no complaints ?Ob/Gyn: no complaints ?Musculoskeletal: no complaints ?Hematology: no complaints ?Neurologic/Psych: no complaints ? ? ?Objective:  ?Physical Examination:  ?Today's Vitals  ? 03/26/22 1535  ?BP

## 2022-04-02 LAB — IGP, APTIMA HPV: HPV Aptima: NEGATIVE

## 2022-04-07 ENCOUNTER — Ambulatory Visit (INDEPENDENT_AMBULATORY_CARE_PROVIDER_SITE_OTHER): Payer: Medicare Other | Admitting: Internal Medicine

## 2022-04-07 ENCOUNTER — Encounter: Payer: Self-pay | Admitting: Internal Medicine

## 2022-04-07 DIAGNOSIS — H1013 Acute atopic conjunctivitis, bilateral: Secondary | ICD-10-CM | POA: Diagnosis not present

## 2022-04-07 DIAGNOSIS — I10 Essential (primary) hypertension: Secondary | ICD-10-CM

## 2022-04-07 DIAGNOSIS — E785 Hyperlipidemia, unspecified: Secondary | ICD-10-CM

## 2022-04-07 DIAGNOSIS — R7303 Prediabetes: Secondary | ICD-10-CM

## 2022-04-07 DIAGNOSIS — N893 Dysplasia of vagina, unspecified: Secondary | ICD-10-CM

## 2022-04-07 NOTE — Patient Instructions (Signed)
Return for fasting labs In mid to late July ? ? ?[phone]  ?

## 2022-04-07 NOTE — Progress Notes (Signed)
? ?Subjective:  ?Patient ID: Maria Kemp, female    DOB: August 05, 1956  Age: 66 y.o. MRN: [id number] ? ?CC: Diagnoses of Essential hypertension, benign, Hyperlipidemia LDL goal <130, Prediabetes, Allergic conjunctivitis of both eyes, and Vaginal dysplasia were pertinent to this visit. ? ? ?This visit occurred during the SARS-CoV-2 public health emergency.  Safety protocols were in place, including screening questions prior to the visit, additional usage of staff PPE, and extensive cleaning of exam room while observing appropriate contact time as indicated for disinfecting solutions.   ? ?HPI ?Maria Kemp presents for follow up on hypertension , prediabetes, hyperlipidemia and vaginal dysplasia  ?Chief Complaint  ?Patient presents with  ? Follow-up  ?  4 month follow up   ? ?1) HTN:  Hypertension: patient checks blood pressure twice weekly at home.  Readings have been for the most part  <130/80 at rest . Patient is following a reduced salt diet most days, is walking regularly and taking her medications as prescribed  without side effects including edema  ? ?2) Prediabetes:  she is maintaining a healthy weight and following a low GI diet.  Planning to increase her exercise regimen to include water aerobics two times weekly  ?3) Hyperlipidemia:  taking atorvastatin.  No muscle pain ? ?4) GYN:  saw [facility] April 19,  PAP repeated. ? ?Outpatient Medications Prior to Visit  ?Medication Sig Dispense Refill  ? amLODipine (NORVASC) 5 MG tablet TAKE 1 TABLET BY MOUTH DAILY 90 tablet 3  ? atorvastatin (LIPITOR) 10 MG tablet TAKE 1 TABLET BY MOUTH DAILY 90 tablet 3  ? EPINEPHRINE 0.3 mg/0.3 mL IJ SOAJ injection USE AS DIRECTED 2 each 0  ? fexofenadine (ALLEGRA) 60 MG tablet Take 60 mg by mouth 2 (two) times daily.    ? fluticasone (FLONASE) 50 MCG/ACT nasal spray Place 1 spray into both nostrils daily.    ? Multiple Vitamin (MULTIVITAMIN) tablet Take by mouth.    ? ?No facility-administered medications prior to visit.   ? ? ?Review of Systems; ? ?Patient denies headache, fevers, malaise, unintentional weight loss, skin rash, eye pain, sinus congestion and sinus pain, sore throat, dysphagia,  hemoptysis , cough, dyspnea, wheezing, chest pain, palpitations, orthopnea, edema, abdominal pain, nausea, melena, diarrhea, constipation, flank pain, dysuria, hematuria, urinary  Frequency, nocturia, numbness, tingling, seizures,  Focal weakness, Loss of consciousness,  Tremor, insomnia, depression, anxiety, and suicidal ideation.   ? ? ? ?Objective:  ?BP 124/78 (BP Location: Left Arm, Patient Position: Sitting, Cuff Size: Normal)   Pulse 68   Temp 98.2 ?F (36.8 ?C) (Oral)   Ht 5\' 3"  (1.6 m)   Wt 139 lb 9.6 oz (63.3 kg)   SpO2 96%   BMI 24.73 kg/m?  ? ?BP Readings from Last 3 Encounters:  ?04/07/22 124/78  ?03/26/22 121/75  ?11/18/21 112/74  ? ? ?Wt Readings from Last 3 Encounters:  ?04/07/22 139 lb 9.6 oz (63.3 kg)  ?03/26/22 139 lb 6.4 oz (63.2 kg)  ?11/18/21 140 lb 12.8 oz (63.9 kg)  ? ? ?General appearance: alert, cooperative and appears stated age ?Ears: normal TM's and external ear canals both ears ?Throat: lips, mucosa, and tongue normal; teeth and gums normal ?Neck: no adenopathy, no carotid bruit, supple, symmetrical, trachea midline and thyroid not enlarged, symmetric, no tenderness/mass/nodules ?Back: symmetric, no curvature. ROM normal. No CVA tenderness. ?Lungs: clear to auscultation bilaterally ?Heart: regular rate and rhythm, S1, S2 normal, no murmur, click, rub or gallop ?Abdomen: soft, non-tender; bowel sounds normal; no  masses,  no organomegaly ?Pulses: 2+ and symmetric ?Skin: Skin color, texture, turgor normal. No rashes or lesions ?Lymph nodes: Cervical, supraclavicular, and axillary nodes normal. ? ?Lab Results  ?Component Value Date  ? HGBA1C 6.2 11/18/2021  ? HGBA1C 5.9 02/06/2021  ? HGBA1C 6.2 08/06/2020  ? ? ?Lab Results  ?Component Value Date  ? CREATININE 0.74 11/18/2021  ? CREATININE 0.87 02/13/2021  ?  CREATININE 1.18 02/06/2021  ? ? ?Lab Results  ?Component Value Date  ? WBC 3.4 (L) 08/06/2020  ? HGB 13.4 08/06/2020  ? HCT 39.9 08/06/2020  ? PLT 354.0 08/06/2020  ? GLUCOSE 85 11/18/2021  ? CHOL 171 11/18/2021  ? TRIG 83.0 11/18/2021  ? HDL 61.60 11/18/2021  ? LDLDIRECT 90.0 03/05/2016  ? [place] 92 11/18/2021  ? ALT 18 11/18/2021  ? AST 18 11/18/2021  ? NA 140 11/18/2021  ? K 3.6 11/18/2021  ? CL 103 11/18/2021  ? CREATININE 0.74 11/18/2021  ? BUN 14 11/18/2021  ? CO2 29 11/18/2021  ? TSH 0.50 02/06/2021  ? HGBA1C 6.2 11/18/2021  ? MICROALBUR 1.6 11/18/2021  ? ? ? ?Assessment & Plan:  ? ?Problem List Items Addressed This Visit   ? ? Essential hypertension, benign  ?  Well controlled on current regimen of amlodipine 5 mg.  Advised to reduce dose to 2.5 mg if she develops relative hypotension.  , no changes today. ? ?  ?  ? Relevant Orders  ? Comprehensive metabolic panel  ? Hyperlipidemia LDL goal <130  ?  Managed with atorvastatin LDL goal < 100.  LFTs are needed in June  ?Lab Results  ?Component Value Date  ? CHOL 171 11/18/2021  ? HDL 61.60 11/18/2021  ? [place] 92 11/18/2021  ? LDLDIRECT 90.0 03/05/2016  ? TRIG 83.0 11/18/2021  ? CHOLHDL 3 11/18/2021  ? ?Lab Results  ?Component Value Date  ? ALT 18 11/18/2021  ? AST 18 11/18/2021  ? ALKPHOS 77 11/18/2021  ? BILITOT 0.4 11/18/2021  ? ? ? ?  ?  ? Relevant Orders  ? Lipid panel  ? Vaginal dysplasia  ?  Managed by Dr Fransisca Connors.  Last visit In mid [place] reviewed. PAP and HPV were repeated  And she will  follow up in 6 months for history of low grade vaginal dysplasia and persistent  LGSIL PAP.  ? ?  ?  ? Allergic conjunctivitis  ?  Managed with daily antihistamine.  ? ?  ?  ? Prediabetes  ?  reviewed her  risk of diabetes noted , with last a1c rising.  Repeat in June.  Low GI diet and increased exercise recommended.  Weight is stable and optimal ? ?Lab Results  ?Component Value Date  ? HGBA1C 6.2 11/18/2021  ? ?  ?  ?  ? Relevant Orders  ? Hemoglobin A1c  ? ? ?I  provided  30 minutes of  face-to-face time during this encounter reviewing patient's  management of hypertension and hyperlipidemia,  the status of her recent gyn visit,  her most recent  labs and imaging studies, providing counseling on the above mentioned problems , and coordination  of care .  ?Follow-up: No follow-ups on file. ? ? ?Crecencio Mc, MD ?

## 2022-04-08 NOTE — Assessment & Plan Note (Signed)
reviewed her  risk of diabetes noted , with last a1c rising.  Repeat in June.  Low GI diet and increased exercise recommended.  Weight is stable and optimal ? ?Lab Results  ?Component Value Date  ? HGBA1C 6.2 11/18/2021  ? ?  ?

## 2022-04-08 NOTE — Assessment & Plan Note (Addendum)
Managed by Dr Fransisca Connors.  Last visit In mid [place] reviewed. PAP and HPV were repeated  And she will  follow up in 6 months for history of low grade vaginal dysplasia and persistent  LGSIL PAP.  ?

## 2022-04-08 NOTE — Assessment & Plan Note (Signed)
Managed with daily antihistamine 

## 2022-04-08 NOTE — Assessment & Plan Note (Signed)
Managed with atorvastatin LDL goal < 100.  LFTs are needed in June  ?Lab Results  ?Component Value Date  ? CHOL 171 11/18/2021  ? HDL 61.60 11/18/2021  ? LDLCALC 92 11/18/2021  ? LDLDIRECT 90.0 03/05/2016  ? TRIG 83.0 11/18/2021  ? CHOLHDL 3 11/18/2021  ? ?Lab Results  ?Component Value Date  ? ALT 18 11/18/2021  ? AST 18 11/18/2021  ? ALKPHOS 77 11/18/2021  ? BILITOT 0.4 11/18/2021  ? ? ? ?

## 2022-04-08 NOTE — Assessment & Plan Note (Signed)
Well controlled on current regimen of amlodipine 5 mg.  Advised to reduce dose to 2.5 mg if she develops relative hypotension.  , no changes today. ?

## 2022-05-20 ENCOUNTER — Ambulatory Visit (INDEPENDENT_AMBULATORY_CARE_PROVIDER_SITE_OTHER): Payer: Medicare Other

## 2022-05-20 VITALS — Ht 63.0 in | Wt 139.0 lb

## 2022-05-20 DIAGNOSIS — Z0000 Encounter for general adult medical examination without abnormal findings: Secondary | ICD-10-CM

## 2022-05-20 NOTE — Patient Instructions (Addendum)
  Ms. Heacock , Thank you for taking time to come for your Medicare Wellness Visit. I appreciate your ongoing commitment to your health goals. Please review the following plan we discussed and let me know if I can assist you in the future.   These are the goals we discussed:  Goals      Maintain Healthy Lifestyle     Stay active Healthy diet        This is a list of the screening recommended for you and due dates:  Health Maintenance  Topic Date Due   COVID-19 Vaccine (6 - Moderna series) 06/05/2022*   Mammogram  08/08/2022*   DEXA scan (bone density measurement)  08/08/2022*   Pneumonia Vaccine (1 - PCV) 05/21/2023*   Flu Shot  07/08/2022   Tetanus Vaccine  03/05/2023   Colon Cancer Screening  11/30/2023   Hepatitis C Screening: USPSTF Recommendation to screen - Ages 18-79 yo.  Completed   Zoster (Shingles) Vaccine  Completed   HPV Vaccine  Aged Out  *Topic was postponed. The date shown is not the original due date.

## 2022-05-20 NOTE — Progress Notes (Addendum)
Subjective:   Maria Kemp is a 66 y.o. female who presents for an Initial Medicare Annual Wellness Visit.  Review of Systems    No ROS.  Medicare Wellness Virtual Visit.  Visual/audio telehealth visit, UTA vital signs.   See social history for additional risk factors.   Cardiac Risk Factors include: advanced age (>25men, >29 women);hypertension     Objective:    Today's Vitals   05/20/22 1240  Weight: 139 lb (63 kg)  Height: 5\' 3"  (1.6 m)   Body mass index is 24.62 kg/m.     05/20/2022   12:36 PM 03/26/2022    3:37 PM 10/02/2021    1:29 PM 01/04/2020    3:04 PM 06/09/2019    8:30 PM 12/22/2018    3:06 PM 11/29/2018    6:42 AM  Advanced Directives  Does Patient Have a Medical Advance Directive? Yes Yes Yes Yes No Yes Yes  Type of 12/01/2018 of Estate agent Power of [place];Living will Healthcare Power of [place];Living will   Healthcare Power of [place];Living will Healthcare Power of [place];Living will  Does patient want to make changes to medical advance directive? No - Patient declined Yes (ED - Information included in AVS) Yes (MAU/Ambulatory/Procedural Areas - Information given)   No - Patient declined No - Patient declined  Copy of Healthcare Power of Attorney in Chart? No - copy requested     No - copy requested No - copy requested  Would patient like information on creating a medical advance directive?     No - Patient declined      Current Medications (verified) Outpatient Encounter Medications as of 05/20/2022  Medication Sig   amLODipine (NORVASC) 5 MG tablet TAKE 1 TABLET BY MOUTH DAILY   atorvastatin (LIPITOR) 10 MG tablet TAKE 1 TABLET BY MOUTH DAILY   EPINEPHRINE 0.3 mg/0.3 mL IJ SOAJ injection USE AS DIRECTED   fexofenadine (ALLEGRA) 60 MG tablet Take 60 mg by mouth 2 (two) times daily.   fluticasone (FLONASE) 50 MCG/ACT nasal spray Place 1 spray into both nostrils daily.   Multiple Vitamin (MULTIVITAMIN) tablet Take by  mouth.   [DISCONTINUED] calcium-vitamin D (OSCAL WITH D) 500-200 MG-UNIT tablet Take 1 tablet by mouth daily with breakfast.   No facility-administered encounter medications on file as of 05/20/2022.    Allergies (verified) Codeine and Shrimp [shellfish allergy]   History: Past Medical History:  Diagnosis Date   Arthritis    hands   Colon polyps    Hyperlipidemia    Hypertension    PONV (postoperative nausea and vomiting)    Past Surgical History:  Procedure Laterality Date   ABDOMINOPLASTY  1995   CATARACT EXTRACTION, BILATERAL     CESAREAN SECTION  1989   COLONOSCOPY WITH PROPOFOL N/A 11/29/2018   Procedure: COLONOSCOPY WITH PROPOFOL;  Surgeon: 12/01/2018, MD;  Location: [facility];  Service: Endoscopy;  Laterality: N/A;   POLYPECTOMY N/A 11/29/2018   Procedure: POLYPECTOMY;  Surgeon: 12/01/2018, MD;  Location: [facility];  Service: Endoscopy;  Laterality: N/A;   Family History  Problem Relation Age of Onset   Arthritis Mother    Hyperlipidemia Mother    Hypertension Mother    Heart disease Mother    Uterine cancer Mother    Arthritis Father    Cancer Father    Hyperlipidemia Father    Hypertension Father    Heart disease Father    Diabetes Father    Hyperlipidemia Brother  Hypertension Brother    Social History   Socioeconomic History   Marital status: Divorced    Spouse name: Not on file   Number of children: Not on file   Years of education: Not on file   Highest education level: Professional school degree (e.g., MD, DDS, DVM, JD)  Occupational History   Not on file  Tobacco Use   Smoking status: Never   Smokeless tobacco: Never  Vaping Use   Vaping Use: Never used  Substance and Sexual Activity   Alcohol use: Yes    Comment: rarely - 2-3x/yr   Drug use: No   Sexual activity: Not on file  Other Topics Concern   Not on file  Social History Narrative   Not on file   Social Determinants of Health   Financial Resource  Strain: Low Risk  (05/20/2022)   Overall Financial Resource Strain (CARDIA)    Difficulty of Paying Living Expenses: Not hard at all  Food Insecurity: No Food Insecurity (05/20/2022)   Hunger Vital Sign    Worried About Running Out of Food in the Last Year: Never true    Ran Out of Food in the Last Year: Never true  Transportation Needs: No Transportation Needs (05/20/2022)   PRAPARE - Administrator, Civil ServiceTransportation    Lack of Transportation (Medical): No    Lack of Transportation (Non-Medical): No  Physical Activity: Insufficiently Active (08/22/2021)   Exercise Vital Sign    Days of Exercise per Week: 3 days    Minutes of Exercise per Session: 20 min  Stress: No Stress Concern Present (05/20/2022)   [employer] - Occupational Stress Questionnaire    Feeling of Stress : Not at all  Social Connections: Moderately Integrated (05/20/2022)   Social Connection and Isolation Panel [NHANES]    Frequency of Communication with Friends and Family: More than three times a week    Frequency of Social Gatherings with Friends and Family: Once a week    Attends Religious Services: More than 4 times per year    Active Member of [employer] Financial[employer]ubs or Organizations: Yes    Attends Engineer, structuralClub or Organization Meetings: More than 4 times per year    Marital Status: Divorced    Tobacco Counseling Counseling given: Not Answered   Clinical Intake:  Pre-visit preparation completed: Yes        Diabetes: No  How often do you need to have someone help you when you read instructions, pamphlets, or other written materials from your doctor or pharmacy?: 1 - Never  Interpreter Needed?: No    Activities of Daily Living    05/20/2022   12:37 PM  In your present state of health, do you have any difficulty performing the following activities:  Hearing? 0  Vision? 0  Difficulty concentrating or making decisions? 0  Walking or climbing stairs? 0  Dressing or bathing? 0  Doing errands, shopping? 0  Preparing  Food and eating ? N  Using the Toilet? N  In the past six months, have you accidently leaked urine? N  Do you have problems with loss of bowel control? N  Managing your Medications? N  Managing your Finances? N  Housekeeping or managing your Housekeeping? N   Patient Care Team: Sherlene Shamsullo, Teresa L, MD as PCP - General (Internal Medicine) Benita GutterStanton, Kristi D, RN as Registered Nurse  Indicate any recent Medical Services you may have received from other than [facility] providers in the past year (date may be approximate).     Assessment:  This is a routine wellness examination for Maria Kemp.  Virtual Visit via Telephone Note  I connected with  Maria Kemp on 05/20/22 at 12:30 PM EDT by telephone and verified that I am speaking with the correct person using two identifiers.  Persons participating in the virtual visit: patient/Nurse Health Advisor   I discussed the limitations of performing an evaluation and management service by telehealth. We continued and completed visit with audio only. Some vital signs may be absent or patient reported.   Hearing/Vision screen Hearing Screening - Comments:: Patient is able to hear conversational tones without difficulty.  No issues reported. Vision Screening - Comments:: Wears readers only Bilateral cataracts  Dietary issues and exercise activities discussed: Current Exercise Habits: Home exercise routine, Type of exercise: walking, Time (Minutes): 30, Frequency (Times/Week): 7, Weekly Exercise (Minutes/Week): 210, Intensity: Mild   Goals Addressed             This Visit's Progress    Maintain Healthy Lifestyle       Stay active Healthy diet       Depression Screen    05/20/2022   12:43 PM 04/07/2022    4:33 PM 08/22/2021   11:29 AM 07/02/2021    4:14 PM 08/01/2020    3:09 PM 05/11/2017    3:20 PM  PHQ 2/9 Scores  PHQ - 2 Score 0 0 0 0 0 0  PHQ- 9 Score      0    Fall Risk    05/20/2022   12:37 PM 04/07/2022    4:33 PM 08/22/2021   11:29 AM  07/02/2021    4:14 PM 02/06/2021    2:36 PM  Fall Risk   Falls in the past year? 0 0 0 0 0  Number falls in past yr:   0    Injury with Fall?   0    Risk for fall due to :  No Fall Risks     Follow up Falls evaluation completed Falls evaluation completed Falls evaluation completed Falls evaluation completed Falls evaluation completed   FALL RISK PREVENTION PERTAINING TO THE HOME: Home free of loose throw rugs in walkways, pet beds, electrical cords, etc? Yes  Adequate lighting in your home to reduce risk of falls? Yes   ASSISTIVE DEVICES UTILIZED TO PREVENT FALLS: Life alert? No  Use of a cane, walker or w/c? No   TIMED UP AND GO: Was the test performed? No .   Cognitive Function:  Patient is alert and oriented x3.      Immunizations Immunization History  Administered Date(s) Administered   Influenza, High Dose Seasonal PF 09/07/2021   Influenza,inj,Quad PF,6+ Mos 09/05/2015, 09/07/2018   Influenza-Unspecified 09/12/2016, 08/24/2017, 09/21/2019   Moderna Covid-19 Vaccine Bivalent Booster 70yrs & up 09/18/2021   Moderna Sars-Covid-2 Vaccination 12/08/2019, 01/06/2020, 09/28/2020, 04/06/2021   Pneumococcal-Unspecified 02/06/2021   Tdap 03/04/2013   Zoster Recombinat (Shingrix) 03/29/2018, 07/08/2018   Pneumococcal vaccine status: Due, Education has been provided regarding the importance of this vaccine. Advised may receive this vaccine at local pharmacy or Health Dept. Aware to provide a copy of the vaccination record if obtained from local pharmacy or Health Dept. Verbalized acceptance and understanding.  Screening Tests Health Maintenance  Topic Date Due   COVID-19 Vaccine (6 - Moderna series) 06/05/2022 (Originally 01/19/2022)   MAMMOGRAM  08/08/2022 (Originally 01/30/2022)   DEXA SCAN  08/08/2022 (Originally 08/07/2020)   Pneumonia Vaccine 37+ Years old (1 - PCV) 05/21/2023 (Originally 08/07/2020)   INFLUENZA VACCINE  07/08/2022   TETANUS/TDAP  03/05/2023   COLONOSCOPY (Pts  45-25yrs Insurance coverage will need to be confirmed)  11/30/2023   Hepatitis C Screening  Completed   Zoster Vaccines- Shingrix  Completed   HPV VACCINES  Aged Out   Health Maintenance There are no preventive care reminders to display for this patient.  Mammogram- notes scheduled. No referral needed at this time. Deferred for update once completed.   Lung Cancer Screening: (Low Dose CT Chest recommended if Age 32-80 years, 30 pack-year currently smoking OR have quit w/in 15years.) does not qualify.   Vision Screening: Recommended annual ophthalmology exams for early detection of glaucoma and other disorders of the eye.  Dental Screening: Recommended annual dental exams for proper oral hygiene  Community Resource Referral / Chronic Care Management: CRR required this visit?  No   CCM required this visit?  No      Plan:     I have personally reviewed and noted the following in the patient's chart:   Medical and social history Use of alcohol, tobacco or illicit drugs  Current medications and supplements including opioid prescriptions. Patient is not currently taking opioid prescriptions. Functional ability and status Nutritional status Physical activity Advanced directives List of other physicians Hospitalizations, surgeries, and ER visits in previous 12 months Vitals Screenings to include cognitive, depression, and falls Referrals and appointments  In addition, I have reviewed and discussed with patient certain preventive protocols, quality metrics, and best practice recommendations. A written personalized care plan for preventive services as well as general preventive health recommendations were provided to patient.     OBrien-Blaney, Gordie Crumby L, LPN   [id number]/[id number]      I have reviewed the above information and agree with above.   Duncan Dull, MD

## 2022-05-28 ENCOUNTER — Inpatient Hospital Stay: Payer: Medicare Other | Attending: Obstetrics and Gynecology | Admitting: Obstetrics and Gynecology

## 2022-05-28 ENCOUNTER — Encounter: Payer: Self-pay | Admitting: Obstetrics and Gynecology

## 2022-05-28 VITALS — BP 134/86 | HR 72 | Temp 98.7°F | Resp 20 | Wt 141.5 lb

## 2022-05-28 DIAGNOSIS — N893 Dysplasia of vagina, unspecified: Secondary | ICD-10-CM | POA: Insufficient documentation

## 2022-05-28 NOTE — Progress Notes (Signed)
Gynecologic Oncology Visit   Referring Provider: Dr. Ammie Dalton  PCP: Dr. Derrel Nip  Chief Concern: abnormal PapLSIL  Subjective:  Maria Kemp is a 66 y.o. female who has a long history of slightly abnormal Pap smears, mainly LGSIL, often negative biopsies after colposcopy. 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   Doing well.  No complaints.  No discharge or bleeding.   PAP 03/26/22 showed LSIL, HPV negative.   She experienced dysuria and flank pain 10/23. She had UA with mixed genital flora on urine culture. She saw her pcp who repeated UA and culture with similar results. She did have yeast on microscopy. Symptoms resolved.   Gynecologic History:  Dr Ammie Dalton performed LEEP in 1999 and [employer] in 2006 for cervical dysplasia.  Subsequent Pap smears vary between normal and LGSIL with high risk HPV.  Since 2013 her q6 month PAPs have shown ASCUS, Normal, LSIL, LSIL.  No HPV testing or colposcopy was done during that time period.  10/2014 she saw Dr. Fransisca Connors. Colposcopy was performed. The transformation zone was not visible, but there were no lesions. Pap NILM and HR-HPV negative.   She was seen by Dr Fransisca Connors 12/16 for colposcopy given LSIL cervical PAP, Vaginal Pap: ASCUS, negative HR HPV on 11/16. Biopsy of small cyst in the vagina just below the cervix was performed.  DIAGNOSIS:  A. VAGINA; BIOPSY:  - INFLAMED VAGINAL MUCOSA WITH A LOW GRADE SQUAMOUS INTRAEPITHELIAL LESION (VAIN 1) AND PARTIAL BENIGN CYST.   12/17- Pap/HPV normal  06/17/17- LSIL PAP/HPV Positive  03/17/2018 Colposcopy vagina and cervix, negative.  03/17/2018- Pap NILM, HRHPV negative; Colposcopy negative 05/25/2019- Pap LSIL, HR HPV negative.   02/09/2020 pelvic US was obtained for unexplained endometrial cells on Pap smear.  IMPRESSION: 1. Endometrial stripe measures within normal limits at 2 mm in maximal thickness. 2. 5 mm simple cystic lesion seen close proximity to the proximal endometrial stripe, indeterminate, but  could reflect a small subendometrial cyst versus small amount of trapped simple fluid within the endometrial cavity. 3. Normal right ovary. 4. Nonvisualization of the left ovary. No adnexal mass or free fluid.   01/04/2020- Pap LSIL, HPV negative. Epithelial cell abnormality Her most recent pap 1/27/21showed LSIL and HPV negative. However, endometrial cells were present and we discussed that while this can be benign or due to polyp, endometrial abnormalities including endometrial hyperplasia and/or carcinoma can't be ruled out and Dr. Fransisca Connors recommended endometrial biopsy to further evaluation.    She had an episode of SVT on 06/09/2019 that responded to adenosine. She started maintenance metoprolol.   At her last visit she had an endometrial biopsy on 02/01/2020 pathology revealed tissue insufficient for diagnosis.   Her mother and father passed away. Her mother was diagnosed with carcinosarcoma.   Problem List: Patient Active Problem List   Diagnosis Date Noted   Flank pain 08/22/2021   Prediabetes 02/07/2021   Atypical chest pain 02/05/2021   Change in bowel habits 02/05/2021   Abnormal mammogram with microcalcification 01/29/2021   Encounter for preventive health examination 08/02/2020   Right shoulder injury, initial encounter 08/02/2020   Bilateral cataracts 10/12/2018   Cyst of joint of hand, right 10/12/2018   Allergic conjunctivitis 03/02/2018   Breast nodule 03/22/2017   Vaginal dysplasia 05/21/2016   Low grade squamous intraepithelial lesion on cytologic smear of cervix (LGSIL) 05/21/2016   History of colonic polyps 03/17/2016   H/O sebaceous cyst 09/06/2015   Hyperlipidemia LDL goal [id number]   Lichen planus  09/04/2014   Essential hypertension, benign 09/04/2014   Family history of colonic polyps 09/04/2014   H/O rotator cuff tear 09/04/2014   Past Medical History: Past Medical History:  Diagnosis Date   Arthritis    hands   Colon polyps    Hyperlipidemia     Hypertension    PONV (postoperative nausea and vomiting)    Past Surgical History: Past Surgical History:  Procedure Laterality Date   ABDOMINOPLASTY  1995   CATARACT EXTRACTION, BILATERAL     CESAREAN SECTION  1989   COLONOSCOPY WITH PROPOFOL N/A 11/29/2018   Procedure: COLONOSCOPY WITH PROPOFOL;  Surgeon: [name], MD;  Location: [facility];  Service: Endoscopy;  Laterality: N/A;   POLYPECTOMY N/A 11/29/2018   Procedure: POLYPECTOMY;  Surgeon: [name], MD;  Location: [facility];  Service: Endoscopy;  Laterality: N/A;   Past Gynecologic History:  Menarche: 11 Menstrual details: postmenopausal  OB History: G3P2  Family History: Family History  Problem Relation Age of Onset   Arthritis Mother    Hyperlipidemia Mother    Hypertension Mother    Heart disease Mother    Uterine cancer Mother    Arthritis Father    Cancer Father    Hyperlipidemia Father    Hypertension Father    Heart disease Father    Diabetes Father    Hyperlipidemia Brother    Hypertension Brother    Social History: Social History   Socioeconomic History   Marital status: Divorced    Spouse name: Not on file   Number of children: Not on file   Years of education: Not on file   Highest education level: Professional school degree (e.g., MD, DDS, DVM, JD)  Occupational History   Not on file  Tobacco Use   Smoking status: Never   Smokeless tobacco: Never  Vaping Use   Vaping Use: Never used  Substance and Sexual Activity   Alcohol use: Yes    Comment: rarely - 2-3x/yr   Drug use: No   Sexual activity: Not on file  Other Topics Concern   Not on file  Social History Narrative   Not on file   Social Determinants of Health   Financial Resource Strain: Low Risk  (05/20/2022)   Overall Financial Resource Strain (CARDIA)    Difficulty of Paying Living Expenses: Not hard at all  Food Insecurity: No Food Insecurity (05/20/2022)   Hunger Vital Sign    Worried About  Running Out of Food in the Last Year: Never true    Ran Out of Food in the Last Year: Never true  Transportation Needs: No Transportation Needs (05/20/2022)   PRAPARE - Administrator, Civil Service (Medical): No    Lack of Transportation (Non-Medical): No  Physical Activity: Insufficiently Active (08/22/2021)   Exercise Vital Sign    Days of Exercise per Week: 3 days    Minutes of Exercise per Session: 20 min  Stress: No Stress Concern Present (05/20/2022)   [employer] of Occupational Health - Occupational Stress Questionnaire    Feeling of Stress : Not at all  Social Connections: Moderately Integrated (05/20/2022)   Social Connection and Isolation Panel [NHANES]    Frequency of Communication with Friends and Family: More than three times a week    Frequency of Social Gatherings with Friends and Family: Once a week    Attends Religious Services: More than 4 times per year    Active Member of [employer] or Organizations: Yes  Attends Banker Meetings: More than 4 times per year    Marital Status: Divorced  Intimate Partner Violence: Not At Risk (05/20/2022)   Humiliation, Afraid, Rape, and Kick questionnaire    Fear of Current or Ex-Partner: No    Emotionally Abused: No    Physically Abused: No    Sexually Abused: No   Allergies: Allergies  Allergen Reactions   Codeine Anaphylaxis   Shrimp [Shellfish Allergy] Anaphylaxis    All shell fish   Current Medications: Current Outpatient Medications  Medication Sig Dispense Refill   amLODipine (NORVASC) 5 MG tablet TAKE 1 TABLET BY MOUTH DAILY 90 tablet 3   atorvastatin (LIPITOR) 10 MG tablet TAKE 1 TABLET BY MOUTH DAILY 90 tablet 3   EPINEPHRINE 0.3 mg/0.3 mL IJ SOAJ injection USE AS DIRECTED 2 each 0   fexofenadine (ALLEGRA) 60 MG tablet Take 60 mg by mouth 2 (two) times daily.     fluticasone (FLONASE) 50 MCG/ACT nasal spray Place 1 spray into both nostrils daily.     Multiple Vitamin (MULTIVITAMIN) tablet  Take by mouth.     No current facility-administered medications for this visit.   Review of Systems General:  no complaints Skin: no complaints Eyes: no complaints HEENT: no complaints Breasts: no complaints Pulmonary: no complaints Cardiac: no complaints Gastrointestinal: no complaints Genitourinary/Sexual: no complaints Ob/Gyn: no complaints Musculoskeletal: no complaints Hematology: no complaints Neurologic/Psych: no complaints   Objective:  Physical Examination:  Today's Vitals   05/28/22 1500  BP: 134/86  Pulse: 72  Resp: 20  Temp: 98.7 F (37.1 C)  SpO2: 100%  Weight: 141 lb 8 oz (64.2 kg)    There is no height or weight on file to calculate BMI.  GENERAL: Patient is a well appearing female in no acute distress ABDOMEN:  Soft, nontender.  No CVA tenderness SKIN:  Clear with no obvious rashes or skin changes.  NEURO:  Nonfocal. Well oriented.  Appropriate affect.  Pelvic: chaperoned by NP EGBUS: no lesions Cervix: no lesions, nontender, mobile Vagina: atrophic. No lesions, discharge, or bleeding  Uterus: normal size, nontender, mobile Adnexa: no palpable masses Rectovaginal: deferred  Procedure note: consent signed and time out done.  Cervix and upper vagina soaked with acetic acid.  No visible lesions seen.  Transformation zone not seen. No lesions.       Assessment:  Maria Kemp is a 66 y.o. 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 Cervical os stenosis. LSIL Pap smear with positive HPV 06/17/2017. Negative colposcopy with negative Pap/HRHPV 4/19 and 6/20. 01/04/2020- Pap LSIL, HPV negative. Epithelial cell abnormality. Endometrial cells noted on PAP and this is abnormal in a post menopausal woman.  Endometrial biopsy, insufficient tissue. Pap 01/16/21 NILM, HPV negative. Presented 10/22 with vaginal burning and dysuria. UA from PCP revealed yeast. Exam consistent with mild atrophy.  PAP 03/26/22  showed LSIL, HPV negative.   Family history of carcinosarcoma, mother in her 55's  Plan:   Problem List Items Addressed This Visit       Genitourinary   Vaginal dysplasia - Primary   No lesions seen today and PAP low grade and HPV negative.  She will follow up in 6 months for history of low grade vaginal dysplasia and LSIL PAP.    Leida Lauth, MD

## 2022-07-28 LAB — HM MAMMOGRAPHY

## 2022-09-24 ENCOUNTER — Other Ambulatory Visit: Payer: Self-pay | Admitting: Internal Medicine

## 2022-09-24 ENCOUNTER — Ambulatory Visit: Payer: Medicare Other

## 2022-11-03 ENCOUNTER — Encounter: Payer: Self-pay | Admitting: Surgery

## 2022-11-03 ENCOUNTER — Other Ambulatory Visit: Payer: Self-pay | Admitting: Surgery

## 2022-11-03 ENCOUNTER — Ambulatory Visit (INDEPENDENT_AMBULATORY_CARE_PROVIDER_SITE_OTHER): Payer: Medicare Other | Admitting: Surgery

## 2022-11-03 VITALS — BP 148/80 | HR 67 | Temp 97.6°F | Ht 63.5 in | Wt 142.2 lb

## 2022-11-03 DIAGNOSIS — L723 Sebaceous cyst: Secondary | ICD-10-CM | POA: Diagnosis not present

## 2022-11-03 NOTE — Patient Instructions (Signed)
If you have any concerns or questions, please feel free to call our office. See follow up appointment below.  Excision of Skin Lesions, Care After The following information offers guidance on how to care for yourself after your procedure. Your health care provider may also give you more specific instructions. If you have problems or questions, contact your health care provider. What can I expect after the procedure? After your procedure, it is common to have: Soreness or mild pain. Some redness and swelling. Follow these instructions at home: Excision site care  Follow instructions from your health care provider about how to take care of your excision site. Make sure you: Wash your hands with soap and water for at least 20 seconds before and after you change your bandage (dressing). If soap and water are not available, use hand sanitizer. Change your dressing as told by your health care provider. Leave stitches (sutures), skin glue, or adhesive strips in place. These skin closures may need to stay in place for 2 weeks or longer. If adhesive strip edges start to loosen and curl up, you may trim the loose edges. Do not remove adhesive strips completely unless your health care provider tells you to do that. Check the excision area every day for signs of infection. Watch for: More redness, swelling, or pain. Fluid or blood. Warmth. Pus or a bad smell. Keep the site clean, dry, and protected for at least 48 hours. For bleeding, apply gentle but firm pressure to the area using a folded towel for 20 minutes. Do not take baths, swim, or use a hot tub until your health care provider approves. Ask your health care provider if you may take showers. You may only be allowed to take sponge baths. General instructions Take over-the-counter and prescription medicines only as told by your health care provider. Follow instructions from your health care provider about how to minimize scarring. Scarring should  lessen over time. Avoid sun exposure until the area has healed. Use sunscreen to protect the area from the sun after it has healed. Avoid high-impact exercise and activities until the sutures are removed or the area heals. Keep all follow-up visits. This is important. Contact a health care provider if: You have more redness, swelling, or pain around your excision site. You have fluid or blood coming from your excision site. Your excision site feels warm to the touch. You have pus or a bad smell coming from your excision site. You have a fever. You have pain that does not improve in 2-3 days after your procedure. Get help right away if: You have bleeding that does not stop with pressure or a dressing. Your wound opens up. Summary Take over-the-counter and prescription medicines only as told by your health care provider. Change your dressing as told by your health care provider. Contact a health care provider if you have redness, swelling, pain, or other signs of infection around your excision site. Keep all follow-up visits. This is important. This information is not intended to replace advice given to you by your health care provider. Make sure you discuss any questions you have with your health care provider. Document Revised: 06/25/2021 Document Reviewed: 06/25/2021 Elsevier Patient Education  2023 Elsevier Inc.  

## 2022-11-04 NOTE — Progress Notes (Signed)
Outpatient Surgical Follow Up  11/04/2022  Maria Kemp is an 66 y.o. female.   Chief Complaint  Patient presents with   New Patient (Initial Visit)    Cyst on right elbow     HPI: Maria Kemp is 66 year old well-known to me with a history of subcutaneous soft tissue masses in the right elbow.  She has had that for years and denies symptomatic.  The one that bothers her the most is right in the middle of the elbow on the right side she does express some intermittent discomfort especially when sitting down and putting pressure on the lesions.  Experiences some mild intermittent pain that is dull.  No fevers no chills.  She has had prior excisions of subcutaneous tissues on both arms.  They have been benign in the past. 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  She is very active she is able to perform more than 4 METS of activity without shortness of breath or chest pain.  She is not taking any anticoagulants. CMP and hbA1c is nml  Past Medical History:  Diagnosis Date   Arthritis    hands   Colon polyps    Hyperlipidemia    Hypertension    PONV (postoperative nausea and vomiting)     Past Surgical History:  Procedure Laterality Date   ABDOMINOPLASTY  1995   CATARACT EXTRACTION, BILATERAL     CESAREAN SECTION  1989   COLONOSCOPY WITH PROPOFOL N/A 11/29/2018   Procedure: COLONOSCOPY WITH PROPOFOL;  Surgeon: [name], MD;  Location: [facility];  Service: Endoscopy;  Laterality: N/A;   POLYPECTOMY N/A 11/29/2018   Procedure: POLYPECTOMY;  Surgeon: [name], MD;  Location: [facility];  Service: Endoscopy;  Laterality: N/A;    Family History  Problem Relation Age of Onset   Arthritis Mother    Hyperlipidemia Mother    Hypertension Mother    Heart disease Mother    Uterine cancer Mother    Arthritis Father    Cancer Father    Hyperlipidemia Father    Hypertension Father    Heart disease Father    Diabetes Father    Hyperlipidemia Brother     Hypertension Brother     Social History:  reports that she has never smoked. She has never used smokeless tobacco. She reports current alcohol use. She reports that she does not use drugs.  Allergies:  Allergies  Allergen Reactions   Codeine Anaphylaxis   Shrimp [Shellfish Allergy] Anaphylaxis    All shell fish    Medications reviewed.    ROS Full ROS performed and is otherwise negative other than what is stated in HPI   BP (!) 148/80   Pulse 67   Temp 97.6 F (36.4 C) (Oral)   Ht 5' 3.5" (1.613 m)   Wt 142 lb 3.2 oz (64.5 kg)   SpO2 97%   BMI 24.79 kg/m   Physical Exam Vitals and nursing note reviewed. Exam conducted with a chaperone present.  Constitutional:      General: She is not in acute distress.    Appearance: Normal appearance. She is not ill-appearing.  Eyes:     General: No scleral icterus.       Right eye: No discharge.        Left eye: No discharge.  Musculoskeletal:        General: No swelling or tenderness. Normal range of motion.  Skin:    General: Skin is warm and dry.  Capillary Refill: Capillary refill takes less than 2 seconds.     Comments: The is a 12 mm skin lesion right elbow , there are two additional lesions sub q tissue within the right elbow area, mobile one measures 12 mm and the other aprx 80mm  Neurological:     General: No focal deficit present.     Mental Status: She is alert and oriented to person, place, and time.  Psychiatric:        Mood and Affect: Mood normal.        Behavior: Behavior normal.        Thought Content: Thought content normal.        Judgment: Judgment normal.     Assessment/Plan: Symptomatic soft tissue lesion in the right elbow.  She does have a total of 3 lesions but the one that bothers her the most is right in the middle of the elbow.  Discussed with the patient detail about options of observation versus excision.  Patient wishes to have that excised.  Procedure discussed with the patient in detail.   Risk, benefits and possible complications, but not limited to: Bleeding, infection nerve injuries.  She understands and wished to proceed.  PROCEDURE NOTE DIAGNOSIS Symptomatic soft tissue mass Right elbow  PROCEDURES 1.  Excision of skin lesion 60mm  2.  Intermediate closure measuring 3cm defect right elbow  ANESTHESIA: Lidocaine 1% with epinephrine  EBL: Minimal  FINDINGS: skin cyst  After informed consent was obtained the patient was prepped and draped in the usual sterile fashion.  Lidocaine 1% was injected over the area of interest.  15 blade knife used to create an incision and the subcutaneous tissue was dissected free with hemostats.  The mass was dissected free from adjacent structures using Metzenbaum scissors.  It was sent for permanent pathology.  Hemostasis obtained with pressure.  The wound was closed in a 2 layer fashion with dermal layer using interrupted 3-0 Vicryl.  The skin was closed in a subcuticular fashion using 4-0 Monocryl.  Dermabond was applied.  No complications. The  patient tolerated procedure well  Sterling Big, MD [facility] General Surgeon

## 2022-11-12 ENCOUNTER — Telehealth: Payer: Self-pay

## 2022-11-12 NOTE — Telephone Encounter (Signed)
Spoke with the patient and let her know that the pathology just showed fibrosis and no malignancy.

## 2022-11-12 NOTE — Telephone Encounter (Signed)
-----   Message from Leafy Ro, MD sent at 11/12/2022  9:38 AM EST ----- Please let her know Path showed fibrosis .Marland Kitchen Scar tissue but no malignancy ----- Message ----- From: Interface, Lab In [facility] Sent: 11/11/2022   5:34 PM EST To: Leafy Ro, MD

## 2022-11-19 NOTE — Telephone Encounter (Signed)
Abstraction  

## 2022-11-26 ENCOUNTER — Inpatient Hospital Stay: Payer: Medicare Other | Attending: Obstetrics and Gynecology | Admitting: Obstetrics and Gynecology

## 2022-11-26 VITALS — BP 118/74 | HR 75 | Temp 98.1°F | Wt 142.0 lb

## 2022-11-26 DIAGNOSIS — R87612 Low grade squamous intraepithelial lesion on cytologic smear of cervix (LGSIL): Secondary | ICD-10-CM | POA: Diagnosis present

## 2022-11-26 NOTE — Progress Notes (Signed)
Gynecologic Oncology Visit   Referring Provider: Dr. Arvil Chaco  PCP: Dr. Darrick Huntsman  Chief Concern: abnormal Pap- LSIL  Subjective:  Maria Kemp is a 66 y.o. female, with long history of slightly abnormal pap smears, primarily LGSIL, with often negative biopsies after colposcopy, who returns to clinic for follow up. 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   PAP 03/26/22 showed LSIL, HPV negative.  05/28/22- Colposcopy which was negative for lesions  Dr. Johnnette Litter recommended follow up in 6 months for exam and repeat Pap  She denies complaints. No discharge or bleeding.     Gynecologic History:  Dr Arvil Chaco performed LEEP in 1999 and CKC in 2006 for cervical dysplasia.  Subsequent Pap smears vary between normal and LGSIL with high risk HPV.  Since 2013 her q6 month PAPs have shown ASCUS, Normal, LSIL, LSIL.  No HPV testing or colposcopy was done during that time period.  10/2014 she saw Dr. Johnnette Litter. Colposcopy was performed. The transformation zone was not visible, but there were no lesions. Pap NILM and HR-HPV negative.   She was seen by Dr Johnnette Litter 12/16 for colposcopy given LSIL cervical PAP, Vaginal Pap: ASCUS, negative HR HPV on 11/16. Biopsy of small cyst in the vagina just below the cervix was performed.  DIAGNOSIS:  A. VAGINA; BIOPSY:  - INFLAMED VAGINAL MUCOSA WITH A LOW GRADE SQUAMOUS INTRAEPITHELIAL LESION (VAIN 1) AND PARTIAL BENIGN CYST.   12/17- Pap/HPV normal  06/17/17- LSIL PAP/HPV Positive  03/17/2018 Colposcopy vagina and cervix, negative.  03/17/2018- Pap NILM, HRHPV negative; Colposcopy negative 05/25/2019- Pap LSIL, HR HPV negative.   02/09/2020 pelvic US was obtained for unexplained endometrial cells on Pap smear.  IMPRESSION: 1. Endometrial stripe measures within normal limits at 2 mm in maximal thickness. 2. 5 mm simple cystic lesion seen close proximity to the proximal endometrial stripe, indeterminate, but could reflect a  small subendometrial cyst versus small amount of trapped simple fluid within the endometrial cavity. 3. Normal right ovary. 4. Nonvisualization of the left ovary. No adnexal mass or free fluid.   01/04/2020- Pap LSIL, HPV negative. Epithelial cell abnormality Her most recent pap 1/27/21showed LSIL and HPV negative. However, endometrial cells were present and we discussed that while this can be benign or due to polyp, endometrial abnormalities including endometrial hyperplasia and/or carcinoma can't be ruled out and Dr. Johnnette Litter recommended endometrial biopsy to further evaluation.    She had an episode of SVT on 06/09/2019 that responded to adenosine. She started maintenance metoprolol.   02/01/2020 Endometrial biopsy pathology revealed tissue insufficient for diagnosis.   01/16/21- Pap NILM, HPV negative.   03/26/22  PAP showed LSIL, HPV negative.  05/28/22- Colposcopy which was negative for lesions  Her mother and father passed away. Her mother was diagnosed with carcinosarcoma.   Problem List: Patient Active Problem List   Diagnosis Date Noted   Flank pain 08/22/2021   Prediabetes 02/07/2021   Atypical chest pain 02/05/2021   Change in bowel habits 02/05/2021   Abnormal mammogram with microcalcification 01/29/2021   Encounter for preventive health examination 08/02/2020   Right shoulder injury, initial encounter 08/02/2020   Bilateral cataracts 10/12/2018   Cyst of joint of hand, right 10/12/2018   Allergic conjunctivitis 03/02/2018   Breast nodule 03/22/2017   Vaginal dysplasia 05/21/2016   Low grade squamous intraepithelial lesion on cytologic smear of cervix (LGSIL) 05/21/2016   History of colonic polyps 03/17/2016   H/O sebaceous cyst 09/06/2015   Hyperlipidemia LDL goal <130 09/06/2015  Lichen planus 0000000   Essential hypertension, benign 09/04/2014   Family history of colonic polyps 09/04/2014   H/O rotator cuff tear 09/04/2014   Past Medical History: Past Medical  History:  Diagnosis Date   Arthritis    hands   Colon polyps    Hyperlipidemia    Hypertension    PONV (postoperative nausea and vomiting)    Past Surgical History: Past Surgical History:  Procedure Laterality Date   ABDOMINOPLASTY  1995   CATARACT EXTRACTION, BILATERAL     CESAREAN SECTION  1989   COLONOSCOPY WITH PROPOFOL N/A 11/29/2018   Procedure: COLONOSCOPY WITH PROPOFOL;  Surgeon: Lucilla Lame, MD;  Location: [place];  Service: Endoscopy;  Laterality: N/A;   POLYPECTOMY N/A 11/29/2018   Procedure: POLYPECTOMY;  Surgeon: Lucilla Lame, MD;  Location: [place];  Service: Endoscopy;  Laterality: N/A;   Past Gynecologic History:  Menarche: 11 Menstrual details: postmenopausal  OB History: G3P2  Family History: Family History  Problem Relation Age of Onset   Arthritis Mother    Hyperlipidemia Mother    Hypertension Mother    Heart disease Mother    Uterine cancer Mother    Arthritis Father    Cancer Father    Hyperlipidemia Father    Hypertension Father    Heart disease Father    Diabetes Father    Hyperlipidemia Brother    Hypertension Brother    Social History: Social History   Socioeconomic History   Marital status: Divorced    Spouse name: Not on file   Number of children: Not on file   Years of education: Not on file   Highest education level: Professional school degree (e.g., MD, DDS, DVM, JD)  Occupational History   Not on file  Tobacco Use   Smoking status: Never   Smokeless tobacco: Never  Vaping Use   Vaping Use: Never used  Substance and Sexual Activity   Alcohol use: Yes    Comment: rarely - 2-3x/yr   Drug use: No   Sexual activity: Not on file  Other Topics Concern   Not on file  Social History Narrative   Not on file   Social Determinants of Health   Financial Resource Strain: Low Risk  (05/20/2022)   Overall Financial Resource Strain (CARDIA)    Difficulty of Paying Living Expenses: Not hard at all  Food  Insecurity: No Food Insecurity (05/20/2022)   Hunger Vital Sign    Worried About Running Out of Food in the Last Year: Never true    Ran Out of Food in the Last Year: Never true  Transportation Needs: No Transportation Needs (05/20/2022)   PRAPARE - Hydrologist (Medical): No    Lack of Transportation (Non-Medical): No  Physical Activity: Insufficiently Active (08/22/2021)   Exercise Vital Sign    Days of Exercise per Week: 3 days    Minutes of Exercise per Session: 20 min  Stress: No Stress Concern Present (05/20/2022)   [place]    Feeling of Stress : Not at all  Social Connections: Moderately Integrated (05/20/2022)   Social Connection and Isolation Panel [NHANES]    Frequency of Communication with Friends and Family: More than three times a week    Frequency of Social Gatherings with Friends and Family: Once a week    Attends Religious Services: More than 4 times per year    Active Member of [employer] or Organizations: Yes  Attends Archivist Meetings: More than 4 times per year    Marital Status: Divorced  Intimate Partner Violence: Not At Risk (05/20/2022)   Humiliation, Afraid, Rape, and Kick questionnaire    Fear of Current or Ex-Partner: No    Emotionally Abused: No    Physically Abused: No    Sexually Abused: No   Allergies: Allergies  Allergen Reactions   Codeine Anaphylaxis   Shrimp [Shellfish Allergy] Anaphylaxis    All shell fish   Current Medications: Current Outpatient Medications  Medication Sig Dispense Refill   amLODipine (NORVASC) 5 MG tablet TAKE 1 TABLET BY MOUTH DAILY 90 tablet 3   atorvastatin (LIPITOR) 10 MG tablet TAKE 1 TABLET BY MOUTH DAILY 90 tablet 3   EPINEPHRINE 0.3 mg/0.3 mL IJ SOAJ injection USE AS DIRECTED 2 each 0   fexofenadine (ALLEGRA) 60 MG tablet Take 60 mg by mouth 2 (two) times daily.     fluticasone (FLONASE) 50 MCG/ACT nasal spray  Place 1 spray into both nostrils daily.     Multiple Vitamin (MULTIVITAMIN) tablet Take by mouth.     No current facility-administered medications for this visit.   Review of Systems General:  no complaints Skin: no complaints Eyes: no complaints HEENT: no complaints Breasts: no complaints Pulmonary: no complaints Cardiac: no complaints Gastrointestinal: no complaints Genitourinary/Sexual: no complaints Ob/Gyn: no complaints Musculoskeletal: no complaints Hematology: no complaints Neurologic/Psych: no complaints  Objective:  Physical Examination:  Today's Vitals   11/26/22 1530  BP: 118/74  Pulse: 75  Temp: 98.1 F (36.7 C)  TempSrc: Tympanic  Weight: 142 lb (64.4 kg)   Body mass index is 24.76 kg/m.   GENERAL: Patient is a well appearing female in no acute distress HEENT:  Atraumatic and normocephalic.  ABDOMEN:  Soft, nontender. Nondistended. No masses/ascites EXTREMITIES:  No peripheral edema.   NEURO:  Nonfocal. Well oriented.  Appropriate affect.  Pelvic: EGBUS: no lesions Cervix: surgically absent. Pap obtained. Cervical os is stenotic. Vagina: no lesions, no discharge or bleeding. On palpation there is an area of roughness ~8 mm posterior to the cervix and midline. Visually there is no obvious abnormality there.  Uterus: normal size, nontender, mobile Adnexa: no palpable masses Rectovaginal: deferred    Assessment:  ORIYAH WEAN is a 66 y.o. 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 Cervical os stenosis. LSIL Pap smear with positive HPV 06/17/2017. Negative colposcopy with negative Pap/HRHPV 4/19 and 6/20. 01/04/2020- Pap LSIL, HPV negative. Epithelial cell abnormality. Endometrial cells noted on PAP and this is abnormal in a post menopausal woman.  Endometrial biopsy, insufficient tissue. Pap 01/16/21 NILM, HPV negative. Presented 10/22 with vaginal burning and dysuria. UA from PCP revealed yeast.  Exam consistent with mild atrophy.  PAP 03/26/22 showed LSIL, HPV negative. 05/2022 colposcopy negative.   Family history of carcinosarcoma, mother in her 57's  Plan:   Problem List Items Addressed This Visit       Other   Low grade squamous intraepithelial lesion on cytologic smear of cervix (LGSIL) - Primary   Relevant Orders   IGP, Aptima HPV   No lesions seen today and PAP low grade and HPV negative with prior negative colposcopy. Area palpated on vaginal exam with no visualized lesion. Suspect she may have had a biopsy in this area.    She will follow up in 6 months for history of low grade vaginal dysplasia and LSIL PAP with Dr. Fransisca Connors or  sooner if needed.    Maria Rutter, NP scribed this note  I personally had a face to face interaction and evaluated the patient.  I have reviewed her history and available records and have performed the physical exam as documented above by me. I performed the assessment and plan which was fully formulated by me.  Counseling was completed by me.   Rayyan Orsborn Gaetana Michaelis, MD

## 2022-12-05 ENCOUNTER — Encounter: Payer: Self-pay | Admitting: Obstetrics and Gynecology

## 2022-12-05 LAB — IGP, APTIMA HPV: HPV Aptima: NEGATIVE

## 2022-12-10 ENCOUNTER — Encounter: Payer: Self-pay | Admitting: Obstetrics and Gynecology

## 2022-12-10 NOTE — Progress Notes (Unsigned)
I contacted Dr. Primus Bravo about her Pap results LGSIL/HRHPV negative. After discussing with Dr. Fransisca Connors we recommend repeat colposcopy with PAP/HPV at her appt in June 2024. The patient agrees with this plan.     LOW-GRADE SQUAMOUS INTRAEPITHELIAL LESION (LSIL); (ENCOMPASSING HUMAN PAPILLOMAVIRUS /MILD DYSPLASIA/CIN1).   HPV Aptima Negative   Negative CM Negative CM     Comment: This nucleic acid amplification test detects fourteen high-risk HPV types (16,18,31,33,35,39,45,51,52,56,58,59,66,68) without differentiation.   Maria Waldridge Gaetana Michaelis, MD

## 2023-01-19 ENCOUNTER — Ambulatory Visit (INDEPENDENT_AMBULATORY_CARE_PROVIDER_SITE_OTHER): Payer: Medicare Other | Admitting: Internal Medicine

## 2023-01-19 ENCOUNTER — Encounter: Payer: Self-pay | Admitting: Internal Medicine

## 2023-01-19 VITALS — BP 102/76 | HR 75 | Temp 98.0°F | Ht 63.5 in | Wt 143.8 lb

## 2023-01-19 DIAGNOSIS — I10 Essential (primary) hypertension: Secondary | ICD-10-CM | POA: Diagnosis not present

## 2023-01-19 DIAGNOSIS — K580 Irritable bowel syndrome with diarrhea: Secondary | ICD-10-CM

## 2023-01-19 DIAGNOSIS — E785 Hyperlipidemia, unspecified: Secondary | ICD-10-CM | POA: Diagnosis not present

## 2023-01-19 DIAGNOSIS — R7689 Other specified abnormal immunological findings in serum: Secondary | ICD-10-CM

## 2023-01-19 DIAGNOSIS — R768 Other specified abnormal immunological findings in serum: Secondary | ICD-10-CM

## 2023-01-19 DIAGNOSIS — Z780 Asymptomatic menopausal state: Secondary | ICD-10-CM

## 2023-01-19 DIAGNOSIS — R5383 Other fatigue: Secondary | ICD-10-CM

## 2023-01-19 DIAGNOSIS — K591 Functional diarrhea: Secondary | ICD-10-CM

## 2023-01-19 DIAGNOSIS — R7303 Prediabetes: Secondary | ICD-10-CM | POA: Diagnosis not present

## 2023-01-19 DIAGNOSIS — E876 Hypokalemia: Secondary | ICD-10-CM

## 2023-01-19 DIAGNOSIS — S4991XA Unspecified injury of right shoulder and upper arm, initial encounter: Secondary | ICD-10-CM

## 2023-01-19 MED ORDER — EPINEPHRINE 0.3 MG/0.3ML IJ SOAJ: INTRAMUSCULAR | 0 refills | Status: AC

## 2023-01-19 NOTE — Progress Notes (Addendum)
Subjective:  Patient ID: Maria Kemp, female    DOB: 03-Jun-1956  Age: 67 y.o. MRN: [id number]  CC: The primary encounter diagnosis was Postmenopausal estrogen deficiency. Diagnoses of Essential hypertension, benign, Hyperlipidemia LDL goal <130, Prediabetes, Other fatigue, Functional diarrhea, Right shoulder injury, initial encounter, Irritable bowel syndrome with diarrhea, Elevated anti-tissue transglutaminase (tTG) IgA level, and Hypokalemia were also pertinent to this visit.   HPI Maria Kemp presents for  Chief Complaint  Patient presents with   Medical Management of Chronic Issues    Follow up    1) IBS:  diagnosed by [facility]  c She continues to have Loose stools after certain meals. Has not kept a food diary. . No unintentional weight loss , cramping,  or fecal incontinence.  Frustrated by persistent symptoms   2) right shoulder pain . Had PT and saw Clare Gandy  [facility] for ortho  follow up . Seeing Administrator personal trainer   3 )right foot hammer toe . Saw Vickki Muff,  defers surgery  4) HTN:  Hypertension: patient not checking blood pressure, but she is  following a reduced salt diet most days and is taking medications as prescribed   Outpatient Medications Prior to Visit  Medication Sig Dispense Refill   amLODipine (NORVASC) 5 MG tablet TAKE 1 TABLET BY MOUTH DAILY 90 tablet 3   atorvastatin (LIPITOR) 10 MG tablet TAKE 1 TABLET BY MOUTH DAILY 90 tablet 3   fexofenadine (ALLEGRA) 60 MG tablet Take 60 mg by mouth 2 (two) times daily.     fluticasone (FLONASE) 50 MCG/ACT nasal spray Place 1 spray into both nostrils daily.     Multiple Vitamin (MULTIVITAMIN) tablet Take by mouth.     EPINEPHRINE 0.3 mg/0.3 mL IJ SOAJ injection USE AS DIRECTED 2 each 0   No facility-administered medications prior to visit.    Review of Systems;  Patient denies headache, fevers, malaise, unintentional weight loss, skin rash, eye pain, sinus congestion and sinus pain, sore throat, dysphagia,   hemoptysis , cough, dyspnea, wheezing, chest pain, palpitations, orthopnea, edema, abdominal pain, nausea, melena,, constipation, flank pain, dysuria, hematuria, urinary  Frequency, nocturia, numbness, tingling, seizures,  Focal weakness, Loss of consciousness,  Tremor, insomnia, depression, anxiety, and suicidal ideation.      Objective:  BP 102/76   Pulse 75   Temp 98 F (36.7 C) (Oral)   Ht 5' 3.5" (1.613 m)   Wt 143 lb 12.8 oz (65.2 kg)   SpO2 95%   BMI 25.07 kg/m   BP Readings from Last 3 Encounters:  01/19/23 102/76  11/26/22 118/74  11/03/22 (!) 148/80    Wt Readings from Last 3 Encounters:  01/19/23 143 lb 12.8 oz (65.2 kg)  11/26/22 142 lb (64.4 kg)  11/03/22 142 lb 3.2 oz (64.5 kg)    Physical Exam Vitals reviewed.  Constitutional:      General: She is not in acute distress.    Appearance: Normal appearance. She is normal weight. She is not ill-appearing, toxic-appearing or diaphoretic.  HENT:     Head: Normocephalic.  Eyes:     General: No scleral icterus.       Right eye: No discharge.        Left eye: No discharge.     Conjunctiva/sclera: Conjunctivae normal.  Cardiovascular:     Rate and Rhythm: Normal rate and regular rhythm.     Heart sounds: Normal heart sounds.  Pulmonary:     Effort: Pulmonary effort is normal. No respiratory distress.  Breath sounds: Normal breath sounds.  Musculoskeletal:        General: Normal range of motion.  Skin:    General: Skin is warm and dry.  Neurological:     General: No focal deficit present.     Mental Status: She is alert and oriented to person, place, and time. Mental status is at baseline.  Psychiatric:        Mood and Affect: Mood normal.        Behavior: Behavior normal.        Thought Content: Thought content normal.        Judgment: Judgment normal.     Lab Results  Component Value Date   HGBA1C 6.2 01/19/2023   HGBA1C 6.2 11/18/2021   HGBA1C 5.9 02/06/2021    Lab Results  Component Value  Date   CREATININE 0.78 01/19/2023   CREATININE 0.74 11/18/2021   CREATININE 0.87 02/13/2021    Lab Results  Component Value Date   WBC 4.0 01/19/2023   HGB 13.7 01/19/2023   HCT 40.9 01/19/2023   PLT 440.0 (H) 01/19/2023   GLUCOSE 103 (H) 01/19/2023   CHOL 162 01/19/2023   TRIG 130.0 01/19/2023   HDL 52.40 01/19/2023   LDLDIRECT 86.0 01/19/2023   LDLCALC 83 01/19/2023   ALT 23 01/19/2023   AST 21 01/19/2023   NA 142 01/19/2023   K 3.4 (L) 01/19/2023   CL 105 01/19/2023   CREATININE 0.78 01/19/2023   BUN 17 01/19/2023   CO2 28 01/19/2023   TSH 0.66 01/19/2023   HGBA1C 6.2 01/19/2023   MICROALBUR 1.4 01/19/2023    MR FINGERS RICHT WO CONTRAST  Result Date: 09/04/2020 CLINICAL DATA:  Right thumb mass present for greater than 1 year EXAM: MRI OF THE RIGHT FINGERS WITHOUT CONTRAST TECHNIQUE: Multiplanar, multisequence MR imaging of the right thumb was performed. No intravenous contrast was administered. COMPARISON:  None available FINDINGS: Bones/Joint/Cartilage No acute fracture. No dislocation. No bone marrow edema. No suspicious marrow replacing lesion. Mild osteoarthritis of the first MCP joint and interphalangeal joint of the thumb. No joint effusion. Ligaments Collateral ligaments of the thumb are intact. Muscles and Tendons Intact flexor and extensor tendons. No tenosynovitis. Normal muscle bulk and signal intensity. Soft tissues There is a solid T1/T2 heterogeneously hypointense mass within the volar soft tissues of the right thumb adjacent to the ulnar aspect of the flexor tendon just proximal to the thumb IP joint. Mass measures 1.0 x 0.7 x 1.2 cm (series 12, image 11; series 9, image 16). No erosion to the underlying proximal phalanx. No additional soft tissue masses. No associated soft tissue edema. No cystic lesions or fluid collections. IMPRESSION: 1. Well-circumscribed 1.2 cm solid mass within the volar soft tissues of the right thumb adjacent to the flexor tendon just  proximal to the thumb IP joint. Imaging characteristics are most suggestive of a giant cell tumor of the tendon sheath. 2. Mild osteoarthritis of the first MCP joint and interphalangeal joint of the thumb. Electronically Signed   By: Davina Poke D.O.   On: 09/04/2020 10:35    Assessment & Plan:  .Postmenopausal estrogen deficiency -     DG Bone Density; Future  Essential hypertension, benign Assessment & Plan: Well controlled on current regimen of amlodipine 5 mg.  , no changes today.  Orders: -     Comprehensive metabolic panel -     Microalbumin / creatinine urine ratio  Hyperlipidemia LDL goal <130 Assessment & Plan: Managed with atorvastatin LDL  goal < 100.  She is overdue for labs. LFTs have been normal Lab Results  Component Value Date   CHOL 171 11/18/2021   HDL 61.60 11/18/2021   LDLCALC 92 11/18/2021   LDLDIRECT 90.0 03/05/2016   TRIG 83.0 11/18/2021   CHOLHDL 3 11/18/2021   Lab Results  Component Value Date   ALT 18 11/18/2021   AST 18 11/18/2021   ALKPHOS 77 11/18/2021   BILITOT 0.4 11/18/2021      Orders: -     Lipid panel -     LDL cholesterol, direct  Prediabetes -     Comprehensive metabolic panel -     Hemoglobin A1c -     Microalbumin / creatinine urine ratio  Other fatigue -     CBC with Differential/Platelet -     TSH  Functional diarrhea -     Celiac Disease Ab Screen w/Rfx  Right shoulder injury, initial encounter Assessment & Plan: Improving with PT and training. Sees [facility] for ortho follow up prn    Irritable bowel syndrome with diarrhea Assessment & Plan: Diagnosed by Allen Norris afte rGI evalution.  Reviewed otday Encouraged to keep food diary,  try an elimination diet    Elevated anti-tissue transglutaminase (tTG) IgA level Assessment & Plan: Found during screening for celiac disease.  Accompanied by nmildly elevated platelets. .  Return for additional labs to rule out vasculitis,  Autoimmune disease   Orders: -     ANA;  Future -     Sedimentation rate; Future -     C-reactive protein; Future -     Rheumatoid factor; Future  Hypokalemia Assessment & Plan: Recurrent,  with hypertension .  Checking for hyperaldosteronism  Orders: -     Aldosterone + renin activity w/ ratio; Future  Other orders -     EPINEPHrine; USE AS DIRECTED  Dispense: 2 each; Refill: 0    Follow-up: Return in about 4 weeks (around 02/16/2023).   Crecencio Mc, MD

## 2023-01-19 NOTE — Patient Instructions (Addendum)
The book I referred to today when we were discussing your GI issues is Dr Claris Pong Bennett's book "AIP Diet for Beginners: A Comprehensive Guide to the Autoimmune Paleo protocol"

## 2023-01-20 LAB — CBC WITH DIFFERENTIAL/PLATELET
Basophils Absolute: 0 10*3/uL (ref 0.0–0.1)
Basophils Relative: 0.7 % (ref 0.0–3.0)
Eosinophils Absolute: 0 10*3/uL (ref 0.0–0.7)
Eosinophils Relative: 1 % (ref 0.0–5.0)
HCT: 40.9 % (ref 36.0–46.0)
Hemoglobin: 13.7 g/dL (ref 12.0–15.0)
Lymphocytes Relative: 34.7 % (ref 12.0–46.0)
Lymphs Abs: 1.4 10*3/uL (ref 0.7–4.0)
MCHC: 33.6 g/dL (ref 30.0–36.0)
MCV: 92 fl (ref 78.0–100.0)
Monocytes Absolute: 0.5 10*3/uL (ref 0.1–1.0)
Monocytes Relative: 11.3 % (ref 3.0–12.0)
Neutro Abs: 2.1 10*3/uL (ref 1.4–7.7)
Neutrophils Relative %: 52.3 % (ref 43.0–77.0)
Platelets: 440 10*3/uL — ABNORMAL HIGH (ref 150.0–400.0)
RBC: 4.44 Mil/uL (ref 3.87–5.11)
RDW: 14 % (ref 11.5–15.5)
WBC: 4 10*3/uL (ref 4.0–10.5)

## 2023-01-20 LAB — COMPREHENSIVE METABOLIC PANEL
ALT: 23 U/L (ref 0–35)
AST: 21 U/L (ref 0–37)
Albumin: 4.3 g/dL (ref 3.5–5.2)
Alkaline Phosphatase: 79 U/L (ref 39–117)
BUN: 17 mg/dL (ref 6–23)
CO2: 28 mEq/L (ref 19–32)
Calcium: 10.1 mg/dL (ref 8.4–10.5)
Chloride: 105 mEq/L (ref 96–112)
Creatinine, Ser: 0.78 mg/dL (ref 0.40–1.20)
GFR: 78.58 mL/min (ref >60.00)
Glucose, Bld: 103 mg/dL — ABNORMAL HIGH (ref 70–99)
Potassium: 3.4 mEq/L — ABNORMAL LOW (ref 3.5–5.1)
Sodium: 142 mEq/L (ref 135–145)
Total Bilirubin: 0.3 mg/dL (ref 0.2–1.2)
Total Protein: 7 g/dL (ref 6.0–8.3)

## 2023-01-20 LAB — LIPID PANEL
Cholesterol: 162 mg/dL (ref 0–200)
HDL: 52.4 mg/dL (ref >39.00)
LDL Cholesterol: 83 mg/dL (ref 0–99)
NonHDL: 109.36
Total CHOL/HDL Ratio: 3
Triglycerides: 130 mg/dL (ref 0.0–149.0)
VLDL: 26 mg/dL (ref 0.0–40.0)

## 2023-01-20 LAB — HEMOGLOBIN A1C: Hgb A1c MFr Bld: 6.2 % (ref 4.6–6.5)

## 2023-01-20 LAB — MICROALBUMIN / CREATININE URINE RATIO
Creatinine,U: 183.1 mg/dL
Microalb Creat Ratio: 0.7 mg/g (ref 0.0–30.0)
Microalb, Ur: 1.4 mg/dL (ref 0.0–1.9)

## 2023-01-20 LAB — LDL CHOLESTEROL, DIRECT: Direct LDL: 86 mg/dL

## 2023-01-20 LAB — TSH: TSH: 0.66 u[IU]/mL (ref 0.35–5.50)

## 2023-01-20 NOTE — Assessment & Plan Note (Signed)
Managed with atorvastatin LDL goal < 100.  She is overdue for labs. LFTs have been normal Lab Results  Component Value Date   CHOL 171 11/18/2021   HDL 61.60 11/18/2021   LDLCALC 92 11/18/2021   LDLDIRECT 90.0 03/05/2016   TRIG 83.0 11/18/2021   CHOLHDL 3 11/18/2021   Lab Results  Component Value Date   ALT 18 11/18/2021   AST 18 11/18/2021   ALKPHOS 77 11/18/2021   BILITOT 0.4 11/18/2021

## 2023-01-20 NOTE — Assessment & Plan Note (Signed)
Well controlled on current regimen of amlodipine 5 mg.  , no changes today.

## 2023-01-20 NOTE — Assessment & Plan Note (Signed)
Improving with PT and training. Sees Ted [facility] for ortho follow up prn

## 2023-01-20 NOTE — Assessment & Plan Note (Signed)
Diagnosed by Allen Norris afte rGI evalution.  Reviewed otday Encouraged to keep food diary,  try an elimination diet

## 2023-01-21 LAB — CELIAC DISEASE AB SCREEN W/RFX
Antigliadin Abs, IgA: 6 units (ref 0–19)
IgA/Immunoglobulin A, Serum: 408 mg/dL — ABNORMAL HIGH (ref 87–352)
Transglutaminase IgA: <2 U/mL (ref 0–3)

## 2023-01-25 DIAGNOSIS — R7689 Other specified abnormal immunological findings in serum: Secondary | ICD-10-CM | POA: Insufficient documentation

## 2023-01-25 DIAGNOSIS — E876 Hypokalemia: Secondary | ICD-10-CM | POA: Insufficient documentation

## 2023-01-25 DIAGNOSIS — R768 Other specified abnormal immunological findings in serum: Secondary | ICD-10-CM | POA: Insufficient documentation

## 2023-01-25 NOTE — Assessment & Plan Note (Signed)
Recurrent,  with hypertension .  Checking for hyperaldosteronism

## 2023-01-25 NOTE — Addendum Note (Signed)
Addended by: Crecencio Mc on: 01/25/2023 12:56 PM   Modules accepted: Orders, Level of Service

## 2023-01-25 NOTE — Assessment & Plan Note (Signed)
Found during screening for celiac disease.  Accompanied by nmildly elevated platelets. .  Return for additional labs to rule out vasculitis,  Autoimmune disease

## 2023-03-16 ENCOUNTER — Encounter: Payer: Self-pay | Admitting: Internal Medicine

## 2023-03-16 ENCOUNTER — Ambulatory Visit (INDEPENDENT_AMBULATORY_CARE_PROVIDER_SITE_OTHER): Payer: Medicare Other | Admitting: Internal Medicine

## 2023-03-16 VITALS — BP 120/76 | HR 70 | Temp 97.9°F | Ht 63.5 in | Wt 145.4 lb

## 2023-03-16 DIAGNOSIS — M2011 Hallux valgus (acquired), right foot: Secondary | ICD-10-CM | POA: Diagnosis not present

## 2023-03-16 DIAGNOSIS — Z780 Asymptomatic menopausal state: Secondary | ICD-10-CM

## 2023-03-16 DIAGNOSIS — K580 Irritable bowel syndrome with diarrhea: Secondary | ICD-10-CM | POA: Diagnosis not present

## 2023-03-16 DIAGNOSIS — Z83719 Family history of colon polyps, unspecified: Secondary | ICD-10-CM

## 2023-03-16 DIAGNOSIS — R768 Other specified abnormal immunological findings in serum: Secondary | ICD-10-CM | POA: Diagnosis not present

## 2023-03-16 DIAGNOSIS — Z1231 Encounter for screening mammogram for malignant neoplasm of breast: Secondary | ICD-10-CM

## 2023-03-16 DIAGNOSIS — I10 Essential (primary) hypertension: Secondary | ICD-10-CM

## 2023-03-16 DIAGNOSIS — E785 Hyperlipidemia, unspecified: Secondary | ICD-10-CM

## 2023-03-16 DIAGNOSIS — E876 Hypokalemia: Secondary | ICD-10-CM

## 2023-03-16 MED ORDER — DICYCLOMINE HCL 10 MG PO CAPS: 10.0000 mg | ORAL_CAPSULE | Freq: Three times a day (TID) | ORAL | 0 refills | Status: DC

## 2023-03-16 NOTE — Assessment & Plan Note (Signed)
Found during screening for celiac disease.  Accompanied by nmildly elevated platelets. Maria Kemp  additional labs to rule out vasculitis,  Autoimmune disease has been done and she has a strongly positive ANA titer.  Referral to rheumatology advised and in progress

## 2023-03-16 NOTE — Assessment & Plan Note (Signed)
Surgical opinion by Ether Griffins,  2nd opinion requested by patinntwith a [place] provider

## 2023-03-16 NOTE — Progress Notes (Signed)
Patient ID: Maria Kemp, female    DOB: July 24, 1956  Age: 67 y.o. MRN: [id number]  The patient is here for follow up and  management of other chronic and acute problems.   The risk factors are reflected in the social history.  The roster of all physicians providing medical care to patient - is listed in the Snapshot section of the chart.  Activities of daily living:  The patient is 100% independent in all ADLs: dressing, toileting, feeding as well as independent mobility  Home safety : The patient has smoke detectors in the home. They wear seatbelts.  There are no firearms at home. There is no violence in the home.   There is no risks for hepatitis, STDs or HIV. There is no   history of blood transfusion. They have no travel history to infectious disease endemic areas of the world.  The patient has seen their dentist in the last six month. They have seen their eye doctor in the last year. They admit to slight hearing difficulty with regard to whispered voices and some television programs.  They have deferred audiologic testing in the last year.  They do not  have excessive sun exposure. Discussed the need for sun protection: hats, long sleeves and use of sunscreen if there is significant sun exposure.   Diet: the importance of a healthy diet is discussed. They do have a healthy diet.  The benefits of regular aerobic exercise were discussed. She walks 3 times per week ,  60 minutes.   Depression screen: there are no signs or vegative symptoms of depression- irritability, change in appetite, anhedonia, sadness/tearfullness.  Cognitive assessment: the patient manages all their financial and personal affairs and is actively engaged. They could relate day,date,year and events; recalled 2/3 objects at 3 minutes; performed clock-face test normally.  The following portions of the patient's history were reviewed and updated as appropriate: allergies, current medications, past family history, past  medical history,  past surgical history, past social history  and problem list.  Visual acuity was not assessed per patient preference since she has regular follow up with her ophthalmologist. Hearing and body mass index were assessed and reviewed.   During the course of the visit the patient was educated and counseled about appropriate screening and preventive services including : fall prevention , diabetes screening, nutrition counseling, colorectal cancer screening, and recommended immunizations.    CC: The primary encounter diagnosis was Encounter for screening mammogram for malignant neoplasm of breast. Diagnoses of Family history of colon polyps, unspecified, Irritable bowel syndrome with diarrhea, Hallux valgus, right, Elevated anti-tissue transglutaminase (tTG) IgA level, Hypokalemia, Postmenopausal estrogen deficiency, Essential hypertension, benign, and Hyperlipidemia LDL goal <130 were also pertinent to this visit.  1) IBS:  not treated,  diagnosed by [facility] in 2019.  still having liquid stools after every nearly every meal. Wakes  up at 5:30 am and has a " blowout " after eating snacks before bed. Does not have diarrhea after her evening snack.    Has not tried bentyl   History Maria Kemp has a past medical history of Arthritis, Colon polyps, Hyperlipidemia, Hypertension, and PONV (postoperative nausea and vomiting).   She has a past surgical history that includes Abdominoplasty (1995); Cesarean section (1989); Colonoscopy with propofol (N/A, 11/29/2018); polypectomy (N/A, 11/29/2018); and Cataract extraction, bilateral.   Her family history includes Arthritis in her father and mother; Cancer in her father; Diabetes in her father; Heart disease in her father and mother; Hyperlipidemia in her brother,  father, and mother; Hypertension in her brother, father, and mother; Uterine cancer in her mother.She reports that she has never smoked. She has never used smokeless tobacco. She reports current  alcohol use. She reports that she does not use drugs.  Outpatient Medications Prior to Visit  Medication Sig Dispense Refill   amLODipine (NORVASC) 5 MG tablet TAKE 1 TABLET BY MOUTH DAILY 90 tablet 3   atorvastatin (LIPITOR) 10 MG tablet TAKE 1 TABLET BY MOUTH DAILY 90 tablet 3   EPINEPHrine 0.3 mg/0.3 mL IJ SOAJ injection USE AS DIRECTED 2 each 0   fexofenadine (ALLEGRA) 60 MG tablet Take 60 mg by mouth 2 (two) times daily.     fluticasone (FLONASE) 50 MCG/ACT nasal spray Place 1 spray into both nostrils daily.     Multiple Vitamin (MULTIVITAMIN) tablet Take by mouth.     No facility-administered medications prior to visit.    Review of Systems  Patient denies headache, fevers, malaise, unintentional weight loss, skin rash, eye pain, sinus congestion and sinus pain, sore throat, dysphagia,  hemoptysis , cough, dyspnea, wheezing, chest pain, palpitations, orthopnea, edema, abdominal pain, nausea, melena, constipation, flank pain, dysuria, hematuria, urinary  Frequency, nocturia, numbness, tingling, seizures,  Focal weakness, Loss of consciousness,  Tremor, insomnia, depression, anxiety, and suicidal ideation.     Objective:  BP 120/76   Pulse 70   Temp 97.9 F (36.6 C) (Oral)   Ht 5' 3.5" (1.613 m)   Wt 145 lb 6.4 oz (66 kg)   SpO2 95%   BMI 25.35 kg/m   Physical Exam Vitals reviewed.  Constitutional:      General: She is not in acute distress.    Appearance: Normal appearance. She is well-developed and normal weight. She is not ill-appearing, toxic-appearing or diaphoretic.  HENT:     Head: Normocephalic.     Right Ear: Tympanic membrane, ear canal and external ear normal. There is no impacted cerumen.     Left Ear: Tympanic membrane, ear canal and external ear normal. There is no impacted cerumen.     Nose: Nose normal.     Mouth/Throat:     Mouth: Mucous membranes are moist.     Pharynx: Oropharynx is clear.  Eyes:     General: No scleral icterus.       Right eye: No  discharge.        Left eye: No discharge.     Conjunctiva/sclera: Conjunctivae normal.     Pupils: Pupils are equal, round, and reactive to light.  Neck:     Thyroid: No thyromegaly.     Vascular: No carotid bruit or JVD.  Cardiovascular:     Rate and Rhythm: Normal rate and regular rhythm.     Heart sounds: Normal heart sounds.  Pulmonary:     Effort: Pulmonary effort is normal. No respiratory distress.     Breath sounds: Normal breath sounds.  Chest:  Breasts:    Breasts are symmetrical.     Right: Normal. No swelling, inverted nipple, mass, nipple discharge, skin change or tenderness.     Left: Normal. No swelling, inverted nipple, mass, nipple discharge, skin change or tenderness.  Abdominal:     General: Bowel sounds are normal.     Palpations: Abdomen is soft. There is no mass.     Tenderness: There is no abdominal tenderness. There is no guarding or rebound.  Musculoskeletal:        General: Normal range of motion.     Cervical back:  Normal range of motion and neck supple.  Lymphadenopathy:     Cervical: No cervical adenopathy.     Upper Body:     Right upper body: No supraclavicular, axillary or pectoral adenopathy.     Left upper body: No supraclavicular, axillary or pectoral adenopathy.  Skin:    General: Skin is warm and dry.  Neurological:     General: No focal deficit present.     Mental Status: She is alert and oriented to person, place, and time. Mental status is at baseline.  Psychiatric:        Mood and Affect: Mood normal.        Behavior: Behavior normal.        Thought Content: Thought content normal.        Judgment: Judgment normal.    Assessment & Plan:  Encounter for screening mammogram for malignant neoplasm of breast -     3D Screening Mammogram, Left and Right; Future  Family history of colon polyps, unspecified -     Ambulatory referral to Gastroenterology  Irritable bowel syndrome with diarrhea Assessment & Plan: Diagnosed by Servando SnareWohl afte   her last GI evaluation,  did not try the off label use drug he recommended.   Has not tried  an elimination diet  or dicyclomine  Orders: -     Ambulatory referral to Gastroenterology  Hallux valgus, right Assessment & Plan: Surgical opinion by Ether GriffinsFowler,  2nd opinion requested by patinntwith a [facility] provider    Elevated anti-tissue transglutaminase (tTG) IgA level Assessment & Plan: Found during screening for celiac disease.  Accompanied by nmildly elevated platelets. Marland Kitchen.  additional labs to rule out vasculitis,  Autoimmune disease   Orders: -     Rheumatoid factor -     C-reactive protein -     Sedimentation rate -     ANA  Hypokalemia -     Aldosterone + renin activity w/ ratio  Postmenopausal estrogen deficiency -     DG Bone Density  Essential hypertension, benign Assessment & Plan: With recurrent hypokalemia  ruling out hyperaldosterone    Hyperlipidemia LDL goal <130 Assessment & Plan: Managed with atorvastatin LDL goal < 100.  Her LFTs have been normal Lab Results  Component Value Date   CHOL 162 01/19/2023   HDL 52.40 01/19/2023   LDLCALC 83 01/19/2023   LDLDIRECT 86.0 01/19/2023   TRIG 130.0 01/19/2023   CHOLHDL 3 01/19/2023   Lab Results  Component Value Date   ALT 23 01/19/2023   AST 21 01/19/2023   ALKPHOS 79 01/19/2023   BILITOT 0.3 01/19/2023       Other orders -     Dicyclomine HCl; Take 1 capsule (10 mg total) by mouth 4 (four) times daily -  before meals and at bedtime.  Dispense: 90 capsule; Refill: 0      I provided 40 minutes of  face-to-face time during this encounter reviewing patient's current problems and past surgeries,  recent labs and imaging studies, providing counseling on the above mentioned problems , and coordination  of care .   Follow-up: No follow-ups on file.   Maria Shamseresa L Andrez Lieurance, MD

## 2023-03-16 NOTE — Assessment & Plan Note (Signed)
With recurrent hypokalemia  ruling out hyperaldosterone

## 2023-03-16 NOTE — Patient Instructions (Addendum)
YOUR MAMMOGRAM IS DUE, PLEASE CALL AND GET THIS SCHEDULED! [facility] - call [phone]   The book I referred to today when we were discussing your GI issues is Dr Gordy Councilman Bennett's book "AIP Diet for Beginners: A Comprehensive Guide to the Autoimmune Paleo protocol"      You MAY NOT BE due for your tetanus-diptheria-pertussis vaccine   (TDaP)   .  We will confirm with [facility] what vaccines you have had.   Referral to GI for follow up on IBS and colonoscopy (due Dec 2024)   Labs today to rule out autoimmune disorders and hyperaldosteronism

## 2023-03-16 NOTE — Assessment & Plan Note (Signed)
Managed with atorvastatin LDL goal < 100.  Her LFTs have been normal Lab Results  Component Value Date   CHOL 162 01/19/2023   HDL 52.40 01/19/2023   LDLCALC 83 01/19/2023   LDLDIRECT 86.0 01/19/2023   TRIG 130.0 01/19/2023   CHOLHDL 3 01/19/2023   Lab Results  Component Value Date   ALT 23 01/19/2023   AST 21 01/19/2023   ALKPHOS 79 01/19/2023   BILITOT 0.3 01/19/2023

## 2023-03-16 NOTE — Assessment & Plan Note (Addendum)
Diagnosed by Servando Snare afte  her last GI evaluation,  did not try the off label use drug he recommended.   Has not tried  an elimination diet  or dicyclomine

## 2023-03-17 LAB — SEDIMENTATION RATE: Sed Rate: 13 mm/hr (ref 0–30)

## 2023-03-17 LAB — C-REACTIVE PROTEIN: CRP: <1 mg/dL (ref 0.5–20.0)

## 2023-03-18 LAB — ANA: Anti Nuclear Antibody (ANA): POSITIVE — AB

## 2023-03-18 LAB — ANTI-NUCLEAR AB-TITER (ANA TITER)

## 2023-03-18 NOTE — Addendum Note (Signed)
Addended by: Sherlene Shams on: 03/18/2023 05:26 PM   Modules accepted: Orders

## 2023-04-02 LAB — RHEUMATOID FACTOR: Rheumatoid fact SerPl-aCnc: <14 IU/mL (ref <14)

## 2023-04-02 LAB — ALDOSTERONE + RENIN ACTIVITY W/ RATIO
ALDO / PRA Ratio: 4.1 Ratio (ref 0.9–28.9)
Aldosterone: 2 ng/dL
Renin Activity: 0.49 ng/mL/h (ref 0.25–5.82)

## 2023-04-02 LAB — ANTI-NUCLEAR AB-TITER (ANA TITER)
ANA TITER: 1:40 {titer} — ABNORMAL HIGH
ANA Titer 1: 1:320 {titer} — ABNORMAL HIGH

## 2023-05-13 ENCOUNTER — Telehealth: Payer: Self-pay | Admitting: Internal Medicine

## 2023-05-13 NOTE — Telephone Encounter (Signed)
Copied from CRM [id number]. Topic: Medicare AWV >> May 13, 2023  3:41 PM Payton Doughty wrote: Reason for CRM: N/A 05/13/2023 FOR AWV - MAILBOX FULL  Verlee Rossetti; Care Guide Ambulatory Clinical Support  l [facility] Direct Dial: [phone]

## 2023-05-27 ENCOUNTER — Inpatient Hospital Stay: Payer: Medicare Other | Attending: Obstetrics and Gynecology | Admitting: Obstetrics and Gynecology

## 2023-05-27 ENCOUNTER — Ambulatory Visit: Payer: Medicare Other

## 2023-05-27 ENCOUNTER — Encounter: Payer: Self-pay | Admitting: Obstetrics and Gynecology

## 2023-05-27 VITALS — BP 118/83 | HR 70 | Temp 97.2°F | Wt 143.0 lb

## 2023-05-27 DIAGNOSIS — N893 Dysplasia of vagina, unspecified: Secondary | ICD-10-CM | POA: Diagnosis present

## 2023-05-27 NOTE — Progress Notes (Signed)
Gynecologic Oncology Visit   Referring Provider: Dr. Arvil Chaco  PCP: Dr. Darrick Huntsman  Chief Concern: abnormal Pap- LSIL  Subjective:  Maria Kemp is a 67 y.o. female, with long history of slightly abnormal pap smears, primarily LGSIL, with often negative biopsies after colposcopy, who returns to clinic for follow up. 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   PAP 03/26/22 showed LSIL, HPV negative.  05/28/22- Colposcopy which was negative for lesions.  Pap 12/23 LGSIL/HRHPV negative. Recommend repeat colposcopy with PAP/HPV at her appt in June 2024. She denies gyn complaints. Not sexually active now, no discharge or bleeding.  Has 3 BMs daily with some bloating.    Gynecologic History:  Dr Arvil Chaco performed LEEP in 1999 and CKC in 2006 for cervical dysplasia.  Subsequent Pap smears vary between normal and LGSIL with high risk HPV.  Since 2013 her q6 month PAPs have shown ASCUS, Normal, LSIL, LSIL.  No HPV testing or colposcopy was done during that time period.  10/2014 she saw Dr. Johnnette Litter. Colposcopy was performed. The transformation zone was not visible, but there were no lesions. Pap NILM and HR-HPV negative.   She was seen by Dr Johnnette Litter 12/16 for colposcopy given LSIL cervical PAP, Vaginal Pap: ASCUS, negative HR HPV on 11/16. Biopsy of small cyst in the vagina just below the cervix was performed.  DIAGNOSIS:  A. VAGINA; BIOPSY:  - INFLAMED VAGINAL MUCOSA WITH A LOW GRADE SQUAMOUS INTRAEPITHELIAL LESION (VAIN 1) AND PARTIAL BENIGN CYST.   12/17- Pap/HPV normal  06/17/17- LSIL PAP/HPV Positive  03/17/2018 Colposcopy vagina and cervix, negative.  03/17/2018- Pap NILM, HRHPV negative; Colposcopy negative 05/25/2019- Pap LSIL, HR HPV negative.   02/09/2020 pelvic US was obtained for unexplained endometrial cells on Pap smear.  IMPRESSION: 1. Endometrial stripe measures within normal limits at 2 mm in maximal thickness. 2. 5 mm simple cystic lesion seen close proximity  to the proximal endometrial stripe, indeterminate, but could reflect a small subendometrial cyst versus small amount of trapped simple fluid within the endometrial cavity. 3. Normal right ovary. 4. Nonvisualization of the left ovary. No adnexal mass or free fluid.   01/04/2020- Pap LSIL, HPV negative. Epithelial cell abnormality Her most recent pap 1/27/21showed LSIL and HPV negative. However, endometrial cells were present and we discussed that while this can be benign or due to polyp, endometrial abnormalities including endometrial hyperplasia and/or carcinoma can't be ruled out and Dr. Johnnette Litter recommended endometrial biopsy to further evaluation.    She had an episode of SVT on 06/09/2019 that responded to adenosine. She started maintenance metoprolol.   02/01/2020 Endometrial biopsy pathology revealed tissue insufficient for diagnosis.   01/16/21- Pap NILM, HPV negative.   03/26/22  PAP showed LSIL, HPV negative.  05/28/22- Colposcopy which was negative for lesions  Her mother and father passed away. Her mother was diagnosed with carcinosarcoma.   Problem List: Patient Active Problem List   Diagnosis Date Noted   Hallux valgus, right 03/16/2023   Hypokalemia 01/25/2023   Elevated anti-tissue transglutaminase (tTG) IgA level 01/25/2023   Prediabetes 02/07/2021   Atypical chest pain 02/05/2021   IBS (irritable bowel syndrome) 02/05/2021   Abnormal mammogram with microcalcification 01/29/2021   Encounter for preventive health examination 08/02/2020   Right shoulder injury, initial encounter 08/02/2020   Bilateral cataracts 10/12/2018   Cyst of joint of hand, right 10/12/2018   Allergic conjunctivitis 03/02/2018   Breast nodule 03/22/2017   Vaginal dysplasia 05/21/2016   Low grade squamous intraepithelial lesion  on cytologic smear of cervix (LGSIL) 05/21/2016   History of colonic polyps 03/17/2016   H/O sebaceous cyst 09/06/2015   Hyperlipidemia LDL goal <130 09/06/2015   Lichen  planus 09/04/2014   Essential hypertension, benign 09/04/2014   Family history of colonic polyps 09/04/2014   H/O rotator cuff tear 09/04/2014   Past Medical History: Past Medical History:  Diagnosis Date   Arthritis    hands   Colon polyps    Hyperlipidemia    Hypertension    PONV (postoperative nausea and vomiting)    Past Surgical History: Past Surgical History:  Procedure Laterality Date   ABDOMINOPLASTY  1995   CATARACT EXTRACTION, BILATERAL     CESAREAN SECTION  1989   COLONOSCOPY WITH PROPOFOL N/A 11/29/2018   Procedure: COLONOSCOPY WITH PROPOFOL;  Surgeon: [name], MD;  Location: [facility];  Service: Endoscopy;  Laterality: N/A;   POLYPECTOMY N/A 11/29/2018   Procedure: POLYPECTOMY;  Surgeon: [name], MD;  Location[facility];  Service: Endoscopy;  Laterality: N/A;   Past Gynecologic History:  Menarche: 11 Menstrual details: postmenopausal  OB History: G3P2  Family History: Family History  Problem Relation Age of Onset   Arthritis Mother    Hyperlipidemia Mother    Hypertension Mother    Heart disease Mother    Uterine cancer Mother    Arthritis Father    Cancer Father    Hyperlipidemia Father    Hypertension Father    Heart disease Father    Diabetes Father    Hyperlipidemia Brother    Hypertension Brother    Social History: Social History   Socioeconomic History   Marital status: Divorced    Spouse name: Not on file   Number of children: Not on file   Years of education: Not on file   Highest education level: Professional school degree (e.g., MD, DDS, DVM, JD)  Occupational History   Not on file  Tobacco Use   Smoking status: Never   Smokeless tobacco: Never  Vaping Use   Vaping Use: Never used  Substance and Sexual Activity   Alcohol use: Yes    Comment: rarely - 2-3x/yr   Drug use: No   Sexual activity: Not on file  Other Topics Concern   Not on file  Social History Narrative   Not on file   Social  Determinants of Health   Financial Resource Strain: Low Risk  (03/15/2023)   Overall Financial Resource Strain (CARDIA)    Difficulty of Paying Living Expenses: Not hard at all  Food Insecurity: Unknown (03/15/2023)   Hunger Vital Sign    Worried About Running Out of Food in the Last Year: Not on file    Ran Out of Food in the Last Year: Never true  Transportation Needs: No Transportation Needs (03/15/2023)   PRAPARE - Administrator, Civil Service (Medical): No    Lack of Transportation (Non-Medical): No  Physical Activity: Insufficiently Active (03/15/2023)   Exercise Vital Sign    Days of Exercise per Week: 3 days    Minutes of Exercise per Session: 30 min  Stress: No Stress Concern Present (03/15/2023)   [employer] of Occupational Health - Occupational Stress Questionnaire    Feeling of Stress : Not at all  Social Connections: Unknown (03/15/2023)   Social Connection and Isolation Panel [NHANES]    Frequency of Communication with Friends and Family: More than three times a week    Frequency of Social Gatherings with Friends and  Family: Twice a week    Attends Religious Services: Patient declined    Active Member of Clubs or Organizations: Yes    Attends Banker Meetings: 1 to 4 times per year    Marital Status: Divorced  Intimate Partner Violence: Not At Risk (05/20/2022)   Humiliation, Afraid, Rape, and Kick questionnaire    Fear of Current or Ex-Partner: No    Emotionally Abused: No    Physically Abused: No    Sexually Abused: No   Allergies: Allergies  Allergen Reactions   Codeine Anaphylaxis   Shrimp [Shellfish Allergy] Anaphylaxis    All shell fish   Current Medications: Current Outpatient Medications  Medication Sig Dispense Refill   amLODipine (NORVASC) 5 MG tablet TAKE 1 TABLET BY MOUTH DAILY 90 tablet 3   atorvastatin (LIPITOR) 10 MG tablet TAKE 1 TABLET BY MOUTH DAILY 90 tablet 3   EPINEPHrine 0.3 mg/0.3 mL IJ SOAJ injection USE AS  DIRECTED 2 each 0   fexofenadine (ALLEGRA) 60 MG tablet Take 60 mg by mouth 2 (two) times daily.     fluticasone (FLONASE) 50 MCG/ACT nasal spray Place 1 spray into both nostrils daily.     Multiple Vitamin (MULTIVITAMIN) tablet Take by mouth.     dicyclomine (BENTYL) 10 MG capsule Take 1 capsule (10 mg total) by mouth 4 (four) times daily -  before meals and at bedtime. 90 capsule 0   No current facility-administered medications for this visit.   Review of Systems General:  no complaints Skin: no complaints Eyes: no complaints HEENT: no complaints Breasts: no complaints Pulmonary: no complaints Cardiac: no complaints Gastrointestinal: diarrhea Genitourinary/Sexual: no complaints Ob/Gyn: no complaints Musculoskeletal: no complaints Hematology: no complaints Neurologic/Psych: no complaints  Objective:  Physical Examination:  Today's Vitals   05/27/23 1533  BP: 118/83  Pulse: 70  Temp: (!) 97.2 F (36.2 C)  TempSrc: Tympanic  SpO2: 100%  Weight: 143 lb (64.9 kg)  PainSc: 0-No pain   Body mass index is 24.93 kg/m.   GENERAL: Patient is a well appearing female in no acute distress HEENT:  Atraumatic and normocephalic.  ABDOMEN:  Soft, nontender. Nondistended. No masses/ascites EXTREMITIES:  No peripheral edema.   NEURO:  Nonfocal. Well oriented.  Appropriate affect.  Pelvic: EGBUS: no lesions Cervix: Pap obtained. Cervical os is stenotic. Vagina: no lesions, no discharge or bleeding.   Uterus: normal size, nontender, mobile Adnexa: no palpable masses Rectovaginal: deferred  Colposcopy exam: vagina soaked with acetic acid.  No acetowhite lesions seen. Cervical os stenotic.  Vagina atrophic and bleeds easily to touch.  Assessment:  Maria Kemp is a 67 y.o. 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 Cervical os stenosis. LSIL Pap smear with positive HPV 06/17/2017. Negative colposcopy with negative  Pap/HRHPV 4/19 and 6/20. 01/04/2020- Pap LSIL, HPV negative. Epithelial cell abnormality. Endometrial cells noted on PAP and this is abnormal in a post menopausal woman.  Endometrial biopsy, insufficient tissue. Pap 01/16/21 NILM, HPV negative. Presented 10/22 with vaginal burning and dysuria. UA from PCP revealed yeast. Exam consistent with mild atrophy.  PAP 03/26/22 showed LSIL, HPV negative. 05/2022 colposcopy negative.   Pap 12/23 LGSIL/HRHPV negative. Recommend repeat colposcopy with PAP/HPV at her appt in June 2024. The patient agrees with this plan.   Family history of carcinosarcoma, mother in her 64's  Plan:   Problem List Items Addressed This Visit       Genitourinary  Vaginal dysplasia - Primary   No lesions seen today and PAP low grade and HPV negative with negative colposcopy. Repeated PAP today. She will follow up in 6 months for history of low grade vaginal dysplasia and LSIL PAP or sooner if needed.   Suggested she follow up regarding diarrhea which could be related to irritable bowel.   Leida Lauth, MD

## 2023-06-03 ENCOUNTER — Ambulatory Visit: Payer: Medicare Other

## 2023-06-04 LAB — IGP, APTIMA HPV: HPV Aptima: NEGATIVE

## 2023-06-20 ENCOUNTER — Encounter: Payer: Self-pay | Admitting: Internal Medicine

## 2023-06-22 MED ORDER — NIRMATRELVIR/RITONAVIR (PAXLOVID)TABLET: 3.0000 | ORAL_TABLET | Freq: Two times a day (BID) | ORAL | 0 refills | Status: AC

## 2023-07-22 ENCOUNTER — Encounter: Payer: Self-pay | Admitting: Internal Medicine

## 2023-07-22 ENCOUNTER — Ambulatory Visit: Payer: Medicare Other | Attending: Internal Medicine | Admitting: Internal Medicine

## 2023-07-22 VITALS — BP 115/74 | HR 68 | Resp 14 | Ht 63.0 in | Wt 139.0 lb

## 2023-07-22 DIAGNOSIS — Z8739 Personal history of other diseases of the musculoskeletal system and connective tissue: Secondary | ICD-10-CM | POA: Diagnosis present

## 2023-07-22 DIAGNOSIS — M2011 Hallux valgus (acquired), right foot: Secondary | ICD-10-CM | POA: Diagnosis present

## 2023-07-22 DIAGNOSIS — R768 Other specified abnormal immunological findings in serum: Secondary | ICD-10-CM | POA: Insufficient documentation

## 2023-07-22 DIAGNOSIS — L609 Nail disorder, unspecified: Secondary | ICD-10-CM | POA: Insufficient documentation

## 2023-07-22 NOTE — Progress Notes (Signed)
Office Visit Note  Patient: Maria Kemp             Date of Birth: [birth year]           MRN: [id number]             PCP: Sherlene Shams, MD Referring: Sherlene Shams, MD Visit Date: 07/22/2023 Occupation: Dentist  Subjective:  New Patient (Initial Visit)   History of Present Illness: Maria Kemp is a 67 y.o. female here for evaluation of positive ANA and high serum IgA.  This finding came about due to screening for celiac disease in the setting of chronic diarrhea with some loose and watery stools.  Has been attributed to IBS. She reports a previous workup for suspected lupus more than 20 years ago at [facility] after developing facial rashes.  This was overall negative and attributed to skin reaction from some product or medication.  Has not had facial rashes again for many years.  Not associated with any oral nasal ulcers, lymphadenopathy, Raynaud's symptoms, no history of abnormal bleeding or blood clots. She does have some chronic arthritis issues most noticeably right shoulder with previous rotator cuff tear.  She is seeing orthopedic surgery clinic and doing physical therapy for this.  She previously had physical therapy in 2015 for similar problem with a good benefit and had been doing well until she decreased her amount of exercise associated with COVID.  She is not experiencing any swelling and no new problems in her hands.  She has some chronic deviation of fingers of the right hand due to displaced fracture as a child.  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 Is having some pain at the base of the right second toe.  She saw to podiatrist for this so far 1 recommending corrective surgery he does not want to be off her foot for months so not interested in pursuing that currently. Also with some nail changes at the right thumb with a linear discolored ridge and some spots in it. She traumatized the finger  years ago with chronic abnormal appearance but this current discoloration is new for about 3 years. She has had multiple calcified sebaceous cysts surgically removed over years. Most overlying a joint particularly around right hand and right elbow.   Labs reviewed 03/2023 ANA 1:320 homogenous RF neg ESR 14 CRP <1  01/2023 TTG neg IgA 408  Activities of Daily Living:  Patient reports morning stiffness for 0 minute.   Patient Denies nocturnal pain.  Difficulty dressing/grooming: Denies Difficulty climbing stairs: Denies Difficulty getting out of chair: Denies Difficulty using hands for taps, buttons, cutlery, and/or writing: Reports  Review of Systems  Constitutional:  Positive for fatigue.  HENT:  Negative for mouth sores and mouth dryness.   Eyes:  Negative for dryness.  Respiratory:  Negative for shortness of breath.   Cardiovascular:  Positive for palpitations. Negative for chest pain.  Gastrointestinal:  Positive for diarrhea. Negative for blood in stool and constipation.  Endocrine: Negative for increased urination.  Genitourinary:  Negative for involuntary urination.  Musculoskeletal:  Positive for muscle weakness. Negative for joint pain, gait problem, joint pain, joint swelling, myalgias, morning stiffness, muscle tenderness and myalgias.  Skin:  Negative for color change, rash, hair loss and sensitivity to sunlight.  Allergic/Immunologic: Negative for susceptible to infections.  Neurological:  Negative for dizziness and headaches.  Hematological:  Negative for swollen  glands.  Psychiatric/Behavioral:  Negative for depressed mood and sleep disturbance. The patient is not nervous/anxious.     PMFS History:  Patient Active Problem List   Diagnosis Date Noted   High total serum IgA 07/22/2023   Positive ANA (antinuclear antibody) 07/22/2023   Hallux valgus, right 03/16/2023   Hypokalemia 01/25/2023   Elevated anti-tissue transglutaminase (tTG) IgA level 01/25/2023    Prediabetes 02/07/2021   Atypical chest pain 02/05/2021   IBS (irritable bowel syndrome) 02/05/2021   Abnormal mammogram with microcalcification 01/29/2021   Encounter for preventive health examination 08/02/2020   Right shoulder injury, initial encounter 08/02/2020   Bilateral cataracts 10/12/2018   Cyst of joint of hand, right 10/12/2018   Allergic conjunctivitis 03/02/2018   Breast nodule 03/22/2017   Vaginal dysplasia 05/21/2016   Low grade squamous intraepithelial lesion on cytologic smear of cervix (LGSIL) 05/21/2016   History of colonic polyps 03/17/2016   H/O sebaceous cyst 09/06/2015   Hyperlipidemia LDL goal <130 09/06/2015   Lichen planus 09/04/2014   Essential hypertension, benign 09/04/2014   Family history of colonic polyps 09/04/2014   H/O rotator cuff tear 09/04/2014    Past Medical History:  Diagnosis Date   Arthritis    hands   Colon polyps    Hyperlipidemia    Hypertension    PONV (postoperative nausea and vomiting)     Family History  Problem Relation Age of Onset   Arthritis Mother    Hyperlipidemia Mother    Hypertension Mother    Heart disease Mother    Uterine cancer Mother    Arthritis Father    Cancer Father    Hyperlipidemia Father    Hypertension Father    Heart disease Father    Diabetes Father    Hyperlipidemia Brother    Hypertension Brother    Past Surgical History:  Procedure Laterality Date   ABDOMINOPLASTY  1995   CATARACT EXTRACTION, BILATERAL     CESAREAN SECTION  1989   COLONOSCOPY WITH PROPOFOL N/A 11/29/2018   Procedure: COLONOSCOPY WITH PROPOFOL;  Surgeon: [name], MD;  Location: [facility];  Service: Endoscopy;  Laterality: N/A;   LESION REMOVAL     Patient states she has had multiple done   POLYPECTOMY N/A 11/29/2018   Procedure: POLYPECTOMY;  Surgeon: [name], MD;  Location: [facility];  Service: Endoscopy;  Laterality: N/A;   Social History   Social History Narrative   Not on file    Immunization History  Administered Date(s) Administered   Influenza, High Dose Seasonal PF 09/07/2021   Influenza,inj,Quad PF,6+ Mos 09/05/2015, 09/07/2018   Influenza-Unspecified 09/12/2016, 08/24/2017, 09/21/2019, 09/12/2022   Moderna Covid-19 Vaccine Bivalent Booster 65yrs & up 09/18/2021   Moderna Sars-Covid-2 Vaccination 12/08/2019, 01/06/2020, 09/28/2020, 04/06/2021   Pneumococcal-Unspecified 02/06/2021   Respiratory Syncytial Virus Vaccine,Recomb Aduvanted(Arexvy) 09/30/2022   Tdap 03/04/2013   Zoster Recombinant(Shingrix) 03/29/2018, 07/08/2018     Objective: Vital Signs: BP 115/74 (BP Location: Right Arm, Patient Position: Sitting, Cuff Size: Normal)   Pulse 68   Resp 14   Ht 5\' 3"  (1.6 m)   Wt 139 lb (63 kg)   BMI 24.62 kg/m    Physical Exam HENT:     Mouth/Throat:     Mouth: Mucous membranes are moist.     Pharynx: Oropharynx is clear.  Eyes:     Conjunctiva/sclera: Conjunctivae normal.  Cardiovascular:     Rate and Rhythm: Normal rate and regular rhythm.  Pulmonary:     Effort: Pulmonary effort  is normal.     Breath sounds: Normal breath sounds.  Musculoskeletal:     Right lower leg: No edema.     Left lower leg: No edema.  Lymphadenopathy:     Cervical: No cervical adenopathy.  Skin:    General: Skin is warm and dry.  Neurological:     Mental Status: She is alert.  Psychiatric:        Mood and Affect: Mood normal.      Musculoskeletal Exam:  Shoulders full ROM no tenderness or swelling Elbows full ROM no tenderness or swelling, hard nontender nodule over the elbow on extensor surface Wrists full ROM no tenderness or swelling Fingers full ROM, 1st CMC joint squaring and heberdon's nodes on both hands, lateral deviation of 3rd-5th fingers PIPs on right hand no tenderness or swelling Knees full ROM no tenderness or swelling Ankles full ROM no tenderness or swelling Right 1st MTP lateral deviation and bunion present, some skin pressure between toes  without any ulcer or breakdown, 2nd toe cocked up deformity tenderness under MTP joint and skin thickening on top of PIP   Investigation: No additional findings.  Imaging: No results found.  Recent Labs: Lab Results  Component Value Date   WBC 4.0 01/19/2023   HGB 13.7 01/19/2023   PLT 440.0 (H) 01/19/2023   NA 142 01/19/2023   K 3.4 (L) 01/19/2023   CL 105 01/19/2023   CO2 28 01/19/2023   GLUCOSE 103 (H) 01/19/2023   BUN 17 01/19/2023   CREATININE 0.78 01/19/2023   BILITOT 0.3 01/19/2023   ALKPHOS 79 01/19/2023   AST 21 01/19/2023   ALT 23 01/19/2023   PROT 7.0 01/19/2023   ALBUMIN 4.3 01/19/2023   CALCIUM 10.1 01/19/2023   GFRAA >60 06/09/2019    Speciality Comments: No specialty comments available.  Procedures:  No procedures performed Allergies: Codeine and Shrimp [shellfish allergy]   Assessment / Plan:     Visit Diagnoses: Positive ANA (antinuclear antibody) - Plan: Anti-scleroderma antibody, RNP Antibody, Anti-Smith antibody, Sjogrens syndrome-A extractable nuclear antibody, Sjogrens syndrome-B extractable nuclear antibody, Anti-DNA antibody, double-stranded, C3 and C4, Chromatin (Nucleosomal) Antibody  Moderately high positive ANA does not have any synovitis appreciable on exam today or other current clinical criteria for autoimmune connective tissue disease.  Checking antibody panel as above and serum complements.  If significantly abnormal would have to discuss option about treatment versus observation given low amount of current symptoms.  H/O rotator cuff tear  Working on right shoulder with PT with known chronic partial-thickness rotator cuff tearing.  No red flags for inflammatory changes on exam.  Hallux valgus, right  I believe the second toe pain is mostly related to metatarsalgia or coming from the abnormal anatomy.  Cocked up deformity at the second toe I think more likely is mechanical and related to her bunion on the right foot versus any  inflammatory process.  Recommend she could try use of orthotic or pad for metatarsalgia or follow-up with foot specialist if not improving.  High total serum IgA - Plan: IgG, IgA, IgM  Will repeat quantitative serum immunoglobulins with previous mildly elevated IgA but other classes were not checked.  Elevations could represent inappropriate immune activation versus incidental finding.  Fingernail abnormality  Longitudinal discoloration at right thumb.  No associated pitting and no nailfold capillary changes.  Does not look consistent with melanonychia.  The discoloration may be nail dystrophy and there are a few splinter hemorrhages present alongside this.  Does not look typical  for onychomycosis.  For if her workup today is negative could benefit to see dermatology for evaluation.  Orders: Orders Placed This Encounter  Procedures   Anti-scleroderma antibody   RNP Antibody   Anti-Smith antibody   Sjogrens syndrome-A extractable nuclear antibody   Sjogrens syndrome-B extractable nuclear antibody   Anti-DNA antibody, double-stranded   C3 and C4   Chromatin (Nucleosomal) Antibody   IgG, IgA, IgM   No orders of the defined types were placed in this encounter.   Follow-Up Instructions: No follow-ups on file.   Fuller Plan, MD  Note - This record has been created using [employer].  Chart creation errors have been sought, but may not always  have been located. Such creation errors do not reflect on  the standard of medical care.

## 2023-07-22 NOTE — Patient Instructions (Signed)
I suspect the toe pain is related to the cocked up position of the 2nd toe and the great toe bunion/lateral deviation. You could try using a pad or insert for metatarsalgia to decrease pressure onto this point.

## 2023-07-23 LAB — C3 AND C4
C3 Complement: 146 mg/dL (ref 83–193)
C4 Complement: 30 mg/dL (ref 15–57)

## 2023-07-23 LAB — CHROMATIN (NUCLEOSOMAL) ANTIBODY: Chromatin (Nucleosomal) Antibody: <1 AI

## 2023-07-23 LAB — IGG, IGA, IGM
IgG (Immunoglobin G), Serum: 926 mg/dL (ref 600–1540)
IgM, Serum: 16 mg/dL — ABNORMAL LOW (ref 50–300)
Immunoglobulin A: 433 mg/dL — ABNORMAL HIGH (ref 70–320)

## 2023-07-23 LAB — ANTI-DNA ANTIBODY, DOUBLE-STRANDED: ds DNA Ab: <1 [IU]/mL

## 2023-07-23 LAB — SJOGRENS SYNDROME-A EXTRACTABLE NUCLEAR ANTIBODY: SSA (Ro) (ENA) Antibody, IgG: <1 AI

## 2023-07-23 LAB — ANTI-SMITH ANTIBODY: ENA SM Ab Ser-aCnc: <1 AI

## 2023-07-23 LAB — ANTI-SCLERODERMA ANTIBODY: Scleroderma (Scl-70) (ENA) Antibody, IgG: <1 AI

## 2023-07-23 LAB — RNP ANTIBODY: Ribonucleic Protein(ENA) Antibody, IgG: <1 AI

## 2023-07-23 LAB — SJOGRENS SYNDROME-B EXTRACTABLE NUCLEAR ANTIBODY: SSB (La) (ENA) Antibody, IgG: <1 AI

## 2023-08-11 ENCOUNTER — Encounter: Payer: Self-pay | Admitting: Internal Medicine

## 2023-08-12 ENCOUNTER — Encounter: Payer: Self-pay | Admitting: Internal Medicine

## 2023-08-14 ENCOUNTER — Other Ambulatory Visit: Payer: Self-pay

## 2023-08-14 DIAGNOSIS — R899 Unspecified abnormal finding in specimens from other organs, systems and tissues: Secondary | ICD-10-CM

## 2023-08-14 DIAGNOSIS — R768 Other specified abnormal immunological findings in serum: Secondary | ICD-10-CM

## 2023-08-14 LAB — HM DEXA SCAN

## 2023-08-14 LAB — HM MAMMOGRAPHY

## 2023-08-14 NOTE — Progress Notes (Signed)
I discussed the results, please place order for SPEP to draw at [facility] in [place].

## 2023-08-17 ENCOUNTER — Telehealth (INDEPENDENT_AMBULATORY_CARE_PROVIDER_SITE_OTHER): Payer: Medicare Other | Admitting: Internal Medicine

## 2023-08-17 ENCOUNTER — Encounter: Payer: Self-pay | Admitting: Internal Medicine

## 2023-08-17 VITALS — Ht 63.0 in | Wt 139.2 lb

## 2023-08-17 DIAGNOSIS — R778 Other specified abnormalities of plasma proteins: Secondary | ICD-10-CM

## 2023-08-17 DIAGNOSIS — K580 Irritable bowel syndrome with diarrhea: Secondary | ICD-10-CM

## 2023-08-17 DIAGNOSIS — R768 Other specified abnormal immunological findings in serum: Secondary | ICD-10-CM

## 2023-08-17 NOTE — Progress Notes (Signed)
Virtual Visit via Caregility   Note   This format is felt to be most appropriate for this patient at this time.  All issues noted in this document were discussed and addressed.  No physical exam was performed (except for noted visual exam findings with Video Visits).   I connected with Dr. Metta Clines  on 08/17/23 at  8:00 AM EDT by a video enabled telemedicine application  and verified that I am speaking with the correct person using two identifiers. Location patient: home Location provider: work or home office Persons participating in the virtual visit: patient, provider  I discussed the limitations, risks, security and privacy concerns of performing an evaluation and management service by telephone and the availability of in person appointments. I also discussed with the patient that there may be a patient responsible charge related to this service. The patient expressed understanding and agreed to proceed.  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  In April  she had an elevated IgA during screening for celiac disease and a positive ANA . ANA titer was 1:320  ; she was referred to rheumatology for further evaluation.  No clinical signs of SLE of Sjogrens.  Additional  Labs in August were done but not discussed with patient until early September,  a SPEP is ordered for this week.  She has had years of diverse symptoms and non diagnostic workups   2) Diarrhea: loose stools daily ( no solid stools) for the past  3 years,  despite elminiating  fried foods, gluten and dairy. From diet.  Last colonosccopy was 2019  Servando Snare).  Saw Wohl in Feb re loose stools:  IBS diagnosed based on history with pertinent negative hx of weight loss.     ROS: See pertinent positives and negatives per HPI.  Past Medical History:  Diagnosis Date   Arthritis    hands   Colon polyps    Hyperlipidemia    Hypertension     PONV (postoperative nausea and vomiting)     Past Surgical History:  Procedure Laterality Date   ABDOMINOPLASTY  1995   CATARACT EXTRACTION, BILATERAL     CESAREAN SECTION  1989   COLONOSCOPY WITH PROPOFOL N/A 11/29/2018   Procedure: COLONOSCOPY WITH PROPOFOL;  Surgeon: [name], MD;  Location: [facility];  Service: Endoscopy;  Laterality: N/A;   LESION REMOVAL     Patient states she has had multiple done   POLYPECTOMY N/A 11/29/2018   Procedure: POLYPECTOMY;  Surgeon: [name], MD;  Location: [facility];  Service: Endoscopy;  Laterality: N/A;    Family History  Problem Relation Age of Onset   Arthritis Mother    Hyperlipidemia Mother    Hypertension Mother    Heart disease Mother    Uterine cancer Mother    Arthritis Father    Cancer Father    Hyperlipidemia Father    Hypertension Father    Heart disease Father    Diabetes Father    Hyperlipidemia Brother    Hypertension Brother     SOCIAL HX:  reports that she has never smoked. She has been exposed to tobacco smoke. She has never used smokeless tobacco. She reports current alcohol use. She reports that she does not use drugs.    Current Outpatient Medications:    amLODipine (NORVASC) 5 MG tablet, TAKE 1 TABLET BY MOUTH DAILY, Disp:  90 tablet, Rfl: 3   atorvastatin (LIPITOR) 10 MG tablet, TAKE 1 TABLET BY MOUTH DAILY, Disp: 90 tablet, Rfl: 3   EPINEPHrine 0.3 mg/0.3 mL IJ SOAJ injection, USE AS DIRECTED, Disp: 2 each, Rfl: 0   Loratadine (CLARITIN PO), Take by mouth., Disp: , Rfl:    Multiple Vitamin (MULTIVITAMIN) tablet, Take by mouth., Disp: , Rfl:    fexofenadine (ALLEGRA) 60 MG tablet, Take 60 mg by mouth 2 (two) times daily. (Patient not taking: Reported on 07/22/2023), Disp: , Rfl:    fluticasone (FLONASE) 50 MCG/ACT nasal spray, Place 1 spray into both nostrils daily. (Patient not taking: Reported on 08/17/2023), Disp: , Rfl:   EXAM:  VITALS per patient if applicable:  GENERAL: alert,  oriented, appears well and in no acute distress  HEENT: atraumatic, conjunttiva clear, no obvious abnormalities on inspection of external nose and ears  NECK: normal movements of the head and neck  LUNGS: on inspection no signs of respiratory distress, breathing rate appears normal, no obvious gross SOB, gasping or wheezing  CV: no obvious cyanosis  MS: moves all visible extremities without noticeable abnormality  PSYCH/NEURO: pleasant and cooperative, no obvious depression or anxiety, speech and thought processing grossly intact  ASSESSMENT AND PLAN: Abnormal serum protein test -     IFE AND PE, RANDOM URINE  Positive ANA (antinuclear antibody) Assessment & Plan: SPEP ordered for evaluation of posirie ANA,  elevated igA  and is pending . Prior   Rheumatology evaluation reviewed.    Irritable bowel syndrome with diarrhea Assessment & Plan: Diagnosed by Servando Snare after  her last GI evaluation,  did not try the off label use drug he recommended.   Has  tried  an elimination diet  with no change in symptoms.  EGD/ Colonoscopy requested.        I discussed the assessment and treatment plan with the patient. The patient was provided an opportunity to ask questions and all were answered. The patient agreed with the plan and demonstrated an understanding of the instructions.   The patient was advised to call back or seek an in-person evaluation if the symptoms worsen or if the condition fails to improve as anticipated.   I spent 30 minutes dedicated to the care of this patient on the date of this encounter to include pre-visit review of her  medical history,  prior evaluations by rheumatology and GI,  Face-to-face time with the patient , and post visit ordering of testing and therapeutics.    Sherlene Shams, MD

## 2023-08-17 NOTE — Assessment & Plan Note (Addendum)
SPEP ordered for evaluation of posirie ANA,  elevated igA  and is pending . Prior   Rheumatology evaluation reviewed.

## 2023-08-17 NOTE — Assessment & Plan Note (Signed)
Diagnosed by Servando Snare after  her last GI evaluation,  did not try the off label use drug he recommended.   Has  tried  an elimination diet  with no change in symptoms.  EGD/ Colonoscopy requested.

## 2023-08-18 ENCOUNTER — Telehealth: Payer: Self-pay

## 2023-08-18 NOTE — Telephone Encounter (Signed)
Hi Maria Kemp. 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 she is also being worked up for strongly positive ANA . thanks for your consideration

## 2023-08-27 ENCOUNTER — Other Ambulatory Visit: Payer: Self-pay

## 2023-08-27 DIAGNOSIS — Z860100 Personal history of colon polyps, unspecified: Secondary | ICD-10-CM

## 2023-08-27 DIAGNOSIS — R194 Change in bowel habit: Secondary | ICD-10-CM

## 2023-08-27 LAB — IFE AND PE, RANDOM URINE
% BETA, Urine: 30.7 %
ALBUMIN, U: 31 %
ALPHA 1 URINE: 3 %
ALPHA-2-GLOBULIN, U: 18.2 %
GAMMA GLOBULIN URINE: 17 %
Protein, Ur: 6.3 mg/dL

## 2023-08-27 MED ORDER — NA SULFATE-K SULFATE-MG SULF 17.5-3.13-1.6 GM/177ML PO SOLN: 1.0000 | Freq: Once | ORAL | 0 refills | Status: AC

## 2023-08-28 LAB — PROTEIN ELECTROPHORESIS, SERUM
A/G Ratio: 1.4 (ref 0.7–1.7)
Albumin ELP: 4.1 g/dL (ref 2.9–4.4)
Alpha 1: 0.2 g/dL (ref 0.0–0.4)
Alpha 2: 0.7 g/dL (ref 0.4–1.0)
Beta: 1.2 g/dL (ref 0.7–1.3)
Gamma Globulin: 0.7 g/dL (ref 0.4–1.8)
Globulin, Total: 2.9 g/dL (ref 2.2–3.9)
Total Protein: 7 g/dL (ref 6.0–8.5)

## 2023-09-15 ENCOUNTER — Telehealth: Payer: Self-pay

## 2023-09-15 NOTE — Telephone Encounter (Signed)
Per pt cancel procedure for now. Per pt she  will call [facility]  back to reschedule

## 2023-09-16 ENCOUNTER — Encounter: Payer: Self-pay | Admitting: Internal Medicine

## 2023-09-16 ENCOUNTER — Ambulatory Visit: Payer: Medicare Other | Admitting: Internal Medicine

## 2023-09-16 ENCOUNTER — Ambulatory Visit: Payer: Medicare Other

## 2023-09-16 VITALS — BP 124/78 | HR 64 | Ht 63.0 in | Wt 142.0 lb

## 2023-09-16 DIAGNOSIS — S6722XA Crushing injury of left hand, initial encounter: Secondary | ICD-10-CM

## 2023-09-16 DIAGNOSIS — Z1211 Encounter for screening for malignant neoplasm of colon: Secondary | ICD-10-CM

## 2023-09-16 DIAGNOSIS — Z23 Encounter for immunization: Secondary | ICD-10-CM

## 2023-09-16 DIAGNOSIS — K591 Functional diarrhea: Secondary | ICD-10-CM

## 2023-09-16 DIAGNOSIS — R768 Other specified abnormal immunological findings in serum: Secondary | ICD-10-CM | POA: Diagnosis not present

## 2023-09-16 DIAGNOSIS — K529 Noninfective gastroenteritis and colitis, unspecified: Secondary | ICD-10-CM

## 2023-09-16 DIAGNOSIS — Z860100 Personal history of colon polyps, unspecified: Secondary | ICD-10-CM

## 2023-09-16 MED ORDER — AMLODIPINE BESYLATE 5 MG PO TABS: 5.0000 mg | ORAL_TABLET | Freq: Every day | ORAL | 3 refills | Status: DC

## 2023-09-16 MED ORDER — ATORVASTATIN CALCIUM 10 MG PO TABS: 10.0000 mg | ORAL_TABLET | Freq: Every day | ORAL | 3 refills | Status: DC

## 2023-09-16 NOTE — Progress Notes (Unsigned)
Subjective:  Patient ID: Maria Kemp, female    DOB: [birth year]  Age: 67 y.o. MRN: [id number]  CC: {There were no encounter diagnoses. (Refresh or delete this SmartLink)}   HPI Maria Kemp presents for No chief complaint on file.   PATIENT CRUSHED HER LEFT RING FINGER IN THE GARAGE DOOR AND TOOK AN X RAY THAT SUGGESTS THE TIP OF THE DIP WAS CRUSHED   LOOSE STOOLS A1.5 TO 2 HRS AFTER EVERY MEAL  FOR 3 YEARS.    NO CHANGE WITH GLUTEN FREE DIETS.  CAN DRINK PREMIER PROTEIN SHAKES AND ENSURE PLUS WITHOUT DIARRHEA.    Outpatient Medications Prior to Visit  Medication Sig Dispense Refill   amLODipine (NORVASC) 5 MG tablet TAKE 1 TABLET BY MOUTH DAILY 90 tablet 3   atorvastatin (LIPITOR) 10 MG tablet TAKE 1 TABLET BY MOUTH DAILY 90 tablet 3   EPINEPHrine 0.3 mg/0.3 mL IJ SOAJ injection USE AS DIRECTED 2 each 0   fexofenadine (ALLEGRA) 60 MG tablet Take 60 mg by mouth 2 (two) times daily. (Patient not taking: Reported on 07/22/2023)     fluticasone (FLONASE) 50 MCG/ACT nasal spray Place 1 spray into both nostrils daily. (Patient not taking: Reported on 08/17/2023)     Loratadine (CLARITIN PO) Take by mouth.     Multiple Vitamin (MULTIVITAMIN) tablet Take by mouth.     No facility-administered medications prior to visit.    Review of Systems;  Patient denies headache, fevers, malaise, unintentional weight loss, skin rash, eye pain, sinus congestion and sinus pain, sore throat, dysphagia,  hemoptysis , cough, dyspnea, wheezing, chest pain, palpitations, orthopnea, edema, abdominal pain, nausea, melena, diarrhea, constipation, flank pain, dysuria, hematuria, urinary  Frequency, nocturia, numbness, tingling, seizures,  Focal weakness, Loss of consciousness,  Tremor, insomnia, depression, anxiety, and suicidal ideation.      Objective:  There were no vitals taken for this visit.  BP Readings from Last 3 Encounters:  07/22/23 115/74  05/27/23 118/83  03/16/23 120/76    Wt Readings  from Last 3 Encounters:  08/17/23 139 lb 3.2 oz (63.1 kg)  07/22/23 139 lb (63 kg)  05/27/23 143 lb (64.9 kg)    Physical Exam  Lab Results  Component Value Date   HGBA1C 6.2 01/19/2023   HGBA1C 6.2 11/18/2021   HGBA1C 5.9 02/06/2021    Lab Results  Component Value Date   CREATININE 0.78 01/19/2023   CREATININE 0.74 11/18/2021   CREATININE 0.87 02/13/2021    Lab Results  Component Value Date   WBC 4.0 01/19/2023   HGB 13.7 01/19/2023   HCT 40.9 01/19/2023   PLT 440.0 (H) 01/19/2023   GLUCOSE 103 (H) 01/19/2023   CHOL 162 01/19/2023   TRIG 130.0 01/19/2023   HDL 52.40 01/19/2023   LDLDIRECT 86.0 01/19/2023   LDLCALC 83 01/19/2023   ALT 23 01/19/2023   AST 21 01/19/2023   NA 142 01/19/2023   K 3.4 (L) 01/19/2023   CL 105 01/19/2023   CREATININE 0.78 01/19/2023   BUN 17 01/19/2023   CO2 28 01/19/2023   TSH 0.66 01/19/2023   HGBA1C 6.2 01/19/2023   MICROALBUR 1.4 01/19/2023    MR FINGERS RICHT WO CONTRAST  Result Date: 09/04/2020 CLINICAL DATA:  Right thumb mass present for greater than 1 year EXAM: MRI OF THE RIGHT FINGERS WITHOUT CONTRAST TECHNIQUE: Multiplanar, multisequence MR imaging of the right thumb was performed. No intravenous contrast was administered. COMPARISON:  None available FINDINGS: Bones/Joint/Cartilage No acute fracture. No dislocation. No  bone marrow edema. No suspicious marrow replacing lesion. Mild osteoarthritis of the first MCP joint and interphalangeal joint of the thumb. No joint effusion. Ligaments Collateral ligaments of the thumb are intact. Muscles and Tendons Intact flexor and extensor tendons. No tenosynovitis. Normal muscle bulk and signal intensity. Soft tissues There is a solid T1/T2 heterogeneously hypointense mass within the volar soft tissues of the right thumb adjacent to the ulnar aspect of the flexor tendon just proximal to the thumb IP joint. Mass measures 1.0 x 0.7 x 1.2 cm (series 12, image 11; series 9, image 16). No erosion  to the underlying proximal phalanx. No additional soft tissue masses. No associated soft tissue edema. No cystic lesions or fluid collections. IMPRESSION: 1. Well-circumscribed 1.2 cm solid mass within the volar soft tissues of the right thumb adjacent to the flexor tendon just proximal to the thumb IP joint. Imaging characteristics are most suggestive of a giant cell tumor of the tendon sheath. 2. Mild osteoarthritis of the first MCP joint and interphalangeal joint of the thumb. Electronically Signed   By: Duanne Guess D.O.   On: 09/04/2020 10:35    Assessment & Plan:  .There are no diagnoses linked to this encounter.   I provided 30 minutes of face-to-face time during this encounter reviewing patient's last visit with me, patient's  most recent visit with cardiology,  nephrology,  and neurology,  recent surgical and non surgical procedures, previous  labs and imaging studies, counseling on currently addressed issues,  and post visit ordering to diagnostics and therapeutics .   Follow-up: No follow-ups on file.   Sherlene Shams, MD

## 2023-09-16 NOTE — Patient Instructions (Addendum)
You can return for the Prevnar 20 pneumonia vaccine but plan for a FRIDAY   Referral to [facility] in process  The SPEP and urine IFE did NOT find an abnormal protein spike (result was normal)

## 2023-09-17 DIAGNOSIS — S6722XA Crushing injury of left hand, initial encounter: Secondary | ICD-10-CM | POA: Insufficient documentation

## 2023-09-17 DIAGNOSIS — K52832 Lymphocytic colitis: Secondary | ICD-10-CM | POA: Insufficient documentation

## 2023-09-17 DIAGNOSIS — K591 Functional diarrhea: Secondary | ICD-10-CM | POA: Insufficient documentation

## 2023-09-17 NOTE — Assessment & Plan Note (Signed)
She was scheduled for a colonoscopy with Dr Servando Snare,  but has requested a change of providers .  Referring to [employer].

## 2023-09-17 NOTE — Assessment & Plan Note (Addendum)
Repeat film was done today,  2 weeks after her injury and the distal phalanx does appear to have been crushed

## 2023-09-17 NOTE — Assessment & Plan Note (Addendum)
Etiology unclear.  She was referred to rheumatology for comprehensive evaluation of IgA elevation, positive ANA 1:320 titer,  Screening labs negative for multiple myeloma ,  SLE.

## 2023-09-17 NOTE — Assessment & Plan Note (Signed)
Rheumatology evaluation reviewed:    She has no clinical evidence of SLE or other rheumatic disease

## 2023-09-21 ENCOUNTER — Telehealth: Payer: Self-pay | Admitting: Internal Medicine

## 2023-09-21 NOTE — Telephone Encounter (Signed)
Shanda from [facility] just called and said they are not accepting new patients at the time right now. She tried getting in contact with the patient but no answer.

## 2023-09-22 NOTE — Telephone Encounter (Signed)
Attempted to call pt. No answer no voicemail.

## 2023-09-22 NOTE — Telephone Encounter (Signed)
Patient called back and was advised the place she wanted a referral to for GI is no longer taking New Patient's.Patient states she know the Doctor there and is going to call and see if he will see her. Patient will call back.

## 2023-09-22 NOTE — Telephone Encounter (Signed)
noted 

## 2023-10-23 ENCOUNTER — Ambulatory Visit: Admit: 2023-10-23 | Payer: Medicare Other | Admitting: Gastroenterology

## 2023-10-23 SURGERY — COLONOSCOPY WITH PROPOFOL
Anesthesia: Choice

## 2023-11-02 ENCOUNTER — Telehealth: Payer: Self-pay

## 2023-11-02 NOTE — Telephone Encounter (Signed)
Patient called to state she needs a letter faxed to [phone], stating she had her flu vaccine and when.

## 2023-11-02 NOTE — Telephone Encounter (Signed)
Letter has been faxed.

## 2023-11-25 ENCOUNTER — Inpatient Hospital Stay: Payer: Medicare Other

## 2023-11-25 ENCOUNTER — Encounter: Payer: Self-pay | Admitting: Nurse Practitioner

## 2023-12-23 ENCOUNTER — Inpatient Hospital Stay: Payer: Medicare Other

## 2024-01-20 ENCOUNTER — Inpatient Hospital Stay: Payer: Medicare Other | Attending: Obstetrics and Gynecology | Admitting: Obstetrics and Gynecology

## 2024-01-20 VITALS — BP 126/83 | HR 78 | Temp 98.3°F | Resp 19 | Wt 137.9 lb

## 2024-01-20 DIAGNOSIS — N893 Dysplasia of vagina, unspecified: Secondary | ICD-10-CM | POA: Diagnosis not present

## 2024-01-20 NOTE — Progress Notes (Signed)
Gynecologic Oncology Visit   Referring Provider: Dr. Arvil Chaco  PCP: Dr. Darrick Huntsman  Chief Concern: abnormal Pap- LSIL, HRHPV negative  Subjective:  Maria Kemp is a 68 y.o. female, with long history of slightly abnormal pap smears, primarily LGSIL, wit   She denies gyn complaints. Not sexually active now, no discharge or bleeding.  No new complaints.  Last seen 6/24 with LSIL PAP, HRHPV negative.   Gynecologic History:  Dr Arvil Chaco performed LEEP in 1999 and CKC in 2006 for cervical dysplasia.  Subsequent Pap smears vary between normal and LGSIL with high risk HPV.  Since 2013 her q6 month PAPs have shown ASCUS, Normal, LSIL, LSIL.  No HPV testing or colposcopy was done during that time period.  10/2014 she saw Dr. Johnnette Litter. Colposcopy was performed. The transformation zone was not visible, but there were no lesions. Pap NILM and HR-HPV negative.   She was seen by Dr Johnnette Litter 12/16 for colposcopy given LSIL cervical PAP, Vaginal Pap: ASCUS, negative HR HPV on 11/16. Biopsy of small cyst in the vagina just below the cervix was performed.  DIAGNOSIS:  A. VAGINA; BIOPSY:  - INFLAMED VAGINAL MUCOSA WITH A LOW GRADE SQUAMOUS INTRAEPITHELIAL LESION (VAIN 1) AND PARTIAL BENIGN CYST.   12/17- Pap/HPV normal  06/17/17- LSIL PAP/HPV Positive  03/17/2018 Colposcopy vagina and cervix, negative.  03/17/2018- Pap NILM, HRHPV negative; Colposcopy negative 05/25/2019- Pap LSIL, HR HPV negative.   02/09/2020 pelvic US was obtained for unexplained endometrial cells on Pap smear.  IMPRESSION: 1. Endometrial stripe measures within normal limits at 2 mm in maximal thickness. 2. 5 mm simple cystic lesion seen close proximity to the proximal endometrial stripe, indeterminate, but could reflect a small subendometrial cyst versus small amount of trapped simple fluid within the endometrial cavity. 3. Normal right ovary. 4. Nonvisualization of the left ovary. No adnexal mass or free fluid.   01/04/2020- Pap  LSIL, HPV negative. Epithelial cell abnormality Her most recent pap 1/27/21showed LSIL and HPV negative. However, endometrial cells were present and we discussed that while this can be benign or due to polyp, endometrial abnormalities including endometrial hyperplasia and/or carcinoma can't be ruled out and Dr. Johnnette Litter recommended endometrial biopsy to further evaluation.    She had an episode of SVT on 06/09/2019 that responded to adenosine. She started maintenance metoprolol.   02/01/2020 Endometrial biopsy pathology revealed tissue insufficient for diagnosis.   01/16/21- Pap NILM, HPV negative.   03/26/22  PAP showed LSIL, HPV negative.  05/28/22- Colposcopy which was negative for lesions Pap 12/23 LGSIL/HRHPV negative. Colposcopy at her appt in June 2024 was negative and PAP LSIL/HRHPV negative     Her mother and father passed away. Her mother was diagnosed with carcinosarcoma.   Problem List: Patient Active Problem List   Diagnosis Date Noted   Functional diarrhea 09/17/2023   Crushing injury of left hand and finger 09/17/2023   High total serum IgA 07/22/2023   Positive ANA (antinuclear antibody) 07/22/2023   Fingernail abnormalities 07/22/2023   Hallux valgus, right 03/16/2023   Hypokalemia 01/25/2023   Elevated anti-tissue transglutaminase (tTG) IgA level 01/25/2023   Prediabetes 02/07/2021   Atypical chest pain 02/05/2021   IBS (irritable bowel syndrome) 02/05/2021   Abnormal mammogram with microcalcification 01/29/2021   Encounter for preventive health examination 08/02/2020   Right shoulder injury, initial encounter 08/02/2020   Bilateral cataracts 10/12/2018   Cyst of joint of hand, right 10/12/2018   Allergic conjunctivitis 03/02/2018   Breast nodule 03/22/2017   Vaginal  dysplasia 05/21/2016   Low grade squamous intraepithelial lesion on cytologic smear of cervix (LGSIL) 05/21/2016   History of colonic polyps 03/17/2016   H/O sebaceous cyst 09/06/2015   Hyperlipidemia  LDL goal <130 09/06/2015   Lichen planus 09/04/2014   Essential hypertension, benign 09/04/2014   Family history of colonic polyps 09/04/2014   H/O rotator cuff tear 09/04/2014   Past Medical History: Past Medical History:  Diagnosis Date   Arthritis    hands   Colon polyps    Hyperlipidemia    Hypertension    PONV (postoperative nausea and vomiting)    Past Surgical History: Past Surgical History:  Procedure Laterality Date   ABDOMINOPLASTY  1995   CATARACT EXTRACTION, BILATERAL     CESAREAN SECTION  1989   COLONOSCOPY WITH PROPOFOL N/A 11/29/2018   Procedure: COLONOSCOPY WITH PROPOFOL;  Surgeon: [name], MD;  Location: [facility];  Service: Endoscopy;  Laterality: N/A;   LESION REMOVAL     Patient states she has had multiple done   POLYPECTOMY N/A 11/29/2018   Procedure: POLYPECTOMY;  Surgeon: [name], MD;  Location: [facility];  Service: Endoscopy;  Laterality: N/A;   Past Gynecologic History:  Menarche: 11 Menstrual details: postmenopausal  OB History: G3P2  Family History: Family History  Problem Relation Age of Onset   Arthritis Mother    Hyperlipidemia Mother    Hypertension Mother    Heart disease Mother    Uterine cancer Mother    Arthritis Father    Cancer Father    Hyperlipidemia Father    Hypertension Father    Heart disease Father    Diabetes Father    Hyperlipidemia Brother    Hypertension Brother    Social History: Social History   Socioeconomic History   Marital status: Divorced    Spouse name: Not on file   Number of children: Not on file   Years of education: Not on file   Highest education level: Professional school degree (e.g., MD, DDS, DVM, JD)  Occupational History   Not on file  Tobacco Use   Smoking status: Never    Passive exposure: Past   Smokeless tobacco: Never  Vaping Use   Vaping status: Never Used  Substance and Sexual Activity   Alcohol use: Yes    Comment: rarely - 2-3x/yr   Drug use:  No   Sexual activity: Not on file  Other Topics Concern   Not on file  Social History Narrative   Not on file   Social Drivers of Health   Financial Resource Strain: Low Risk  (03/15/2023)   Overall Financial Resource Strain (CARDIA)    Difficulty of Paying Living Expenses: Not hard at all  Food Insecurity: Unknown (03/15/2023)   Hunger Vital Sign    Worried About Running Out of Food in the Last Year: Not on file    Ran Out of Food in the Last Year: Never true  Transportation Needs: No Transportation Needs (03/15/2023)   PRAPARE - Administrator, Civil Service (Medical): No    Lack of Transportation (Non-Medical): No  Physical Activity: Insufficiently Active (03/15/2023)   Exercise Vital Sign    Days of Exercise per Week: 3 days    Minutes of Exercise per Session: 30 min  Stress: No Stress Concern Present (03/15/2023)   [employer] of Occupational Health - Occupational Stress Questionnaire    Feeling of Stress : Not at all  Social Connections: Unknown (03/15/2023)   Social Connection  and Isolation Panel [NHANES]    Frequency of Communication with Friends and Family: More than three times a week    Frequency of Social Gatherings with Friends and Family: Twice a week    Attends Religious Services: Patient declined    Database administrator or Organizations: Yes    Attends Banker Meetings: 1 to 4 times per year    Marital Status: Divorced  Intimate Partner Violence: Not At Risk (05/20/2022)   Humiliation, Afraid, Rape, and Kick questionnaire    Fear of Current or Ex-Partner: No    Emotionally Abused: No    Physically Abused: No    Sexually Abused: No   Allergies: Allergies  Allergen Reactions   Codeine Anaphylaxis   Shrimp [Shellfish Allergy] Anaphylaxis    All shell fish   Current Medications: Current Outpatient Medications  Medication Sig Dispense Refill   amLODipine (NORVASC) 5 MG tablet Take 1 tablet (5 mg total) by mouth daily. 90 tablet 3    atorvastatin (LIPITOR) 10 MG tablet Take 1 tablet (10 mg total) by mouth daily. 90 tablet 3   EPINEPHrine 0.3 mg/0.3 mL IJ SOAJ injection USE AS DIRECTED 2 each 0   fluticasone (FLONASE) 50 MCG/ACT nasal spray Place 1 spray into both nostrils daily.     Loratadine (CLARITIN PO) Take by mouth.     Multiple Vitamin (MULTIVITAMIN) tablet Take by mouth.     No current facility-administered medications for this visit.   Review of Systems General:  no complaints Skin: no complaints Eyes: no complaints HEENT: no complaints Breasts: no complaints Pulmonary: no complaints Cardiac: no complaints Gastrointestinal: diarrhea Genitourinary/Sexual: no complaints Ob/Gyn: no complaints Musculoskeletal: no complaints Hematology: no complaints Neurologic/Psych: no complaints  Objective:  Physical Examination:  Today's Vitals   01/20/24 1542  BP: 126/83  Pulse: 78  Resp: 19  Temp: 98.3 F (36.8 C)  SpO2: 99%  Weight: 137 lb 14.4 oz (62.6 kg)  PainSc: 0-No pain    GENERAL: Patient is a well appearing female in no acute distress HEENT:  Atraumatic and normocephalic.  ABDOMEN:  Soft, nontender. Nondistended. No masses/ascites EXTREMITIES:  No peripheral edema.   NEURO:  Nonfocal. Well oriented.  Appropriate affect.  Pelvic: EGBUS: no lesions Cervix: Pap obtained. Cervical os is stenotic. Vagina: no lesions, no discharge or bleeding. Atropic with some spotting on PAP.   Uterus: normal size, nontender, mobile Adnexa: no palpable masses Rectovaginal: deferred   Assessment:  Maria Kemp is a 68 y.o. 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 Cervical os stenosis. LSIL Pap smear with positive HPV 06/17/2017. Negative colposcopy with negative Pap/HRHPV 4/19 and 6/20. 01/04/2020- Pap LSIL, HPV negative. Epithelial cell abnormality. Endometrial cells noted on PAP and this is abnormal in a post menopausal woman.  Endometrial biopsy,  insufficient tissue. Pap 01/16/21 NILM, HPV negative. Presented 10/22 with vaginal burning and dysuria. UA from PCP revealed yeast. Exam consistent with mild atrophy.  PAP 03/26/22 showed LSIL, HPV negative. 05/2022 colposcopy negative.   Pap 12/23 LGSIL/HRHPV negative. Colposcopy at her appt in June 2024 was negative and PAP LSIL/HRHPV negative     Family history of carcinosarcoma, mother in her 70's  Plan:   Problem List Items Addressed This Visit       Genitourinary   Vaginal dysplasia - Primary   Relevant Orders   IGP, Aptima HPV    No lesions seen today and PAP low grade and HPV  negative with negative colposcopy 6/24. Repeated PAP/HPV today. She will follow up in 6 months for history of low grade vaginal dysplasia and LSIL PAP or sooner if needed.  Leida Lauth, MD

## 2024-01-27 LAB — IGP, APTIMA HPV: HPV Aptima: NEGATIVE

## 2024-02-03 LAB — HM COLONOSCOPY

## 2024-02-06 HISTORY — PX: FOOT SURGERY: SHX648

## 2024-03-16 ENCOUNTER — Ambulatory Visit: Payer: Medicare Other | Admitting: Internal Medicine

## 2024-03-28 ENCOUNTER — Encounter: Payer: Self-pay | Admitting: Internal Medicine

## 2024-03-28 ENCOUNTER — Ambulatory Visit (INDEPENDENT_AMBULATORY_CARE_PROVIDER_SITE_OTHER): Payer: Medicare Other | Admitting: Internal Medicine

## 2024-03-28 VITALS — BP 116/72 | HR 70 | Ht 63.0 in | Wt 137.0 lb

## 2024-03-28 DIAGNOSIS — K580 Irritable bowel syndrome with diarrhea: Secondary | ICD-10-CM

## 2024-03-28 DIAGNOSIS — R5383 Other fatigue: Secondary | ICD-10-CM | POA: Diagnosis not present

## 2024-03-28 DIAGNOSIS — I10 Essential (primary) hypertension: Secondary | ICD-10-CM | POA: Diagnosis not present

## 2024-03-28 DIAGNOSIS — Z860100 Personal history of colon polyps, unspecified: Secondary | ICD-10-CM

## 2024-03-28 DIAGNOSIS — Z1231 Encounter for screening mammogram for malignant neoplasm of breast: Secondary | ICD-10-CM

## 2024-03-28 DIAGNOSIS — K52832 Lymphocytic colitis: Secondary | ICD-10-CM

## 2024-03-28 DIAGNOSIS — R7303 Prediabetes: Secondary | ICD-10-CM | POA: Diagnosis not present

## 2024-03-28 DIAGNOSIS — R768 Other specified abnormal immunological findings in serum: Secondary | ICD-10-CM

## 2024-03-28 DIAGNOSIS — E785 Hyperlipidemia, unspecified: Secondary | ICD-10-CM

## 2024-03-28 NOTE — Progress Notes (Signed)
 Subjective:  Patient ID: Maria Kemp, female    DOB: [birth year]  Age: 68 y.o. MRN: [id number]  CC: The primary encounter diagnosis was Encounter for screening mammogram for malignant neoplasm of breast. Diagnoses of Essential hypertension, benign, Hyperlipidemia LDL goal <130, Prediabetes, Other fatigue, Lymphocytic colitis, Positive ANA (antinuclear antibody), Irritable bowel syndrome with diarrhea, and History of colonic polyps were also pertinent to this visit.   HPI Maria Kemp presents for follow up on chronic conditions  1) HTN:  Patient is taking her medications as prescribed and notes no adverse effects.  Office readings have been done about once per week and are  generally < 130/80 .  She is avoiding added salt in her diet and exercising regularly except for the last 6 weeks as she has been recovering from foot surgery  .   2)  Chronic diarrhea:  she was referred to [facility] after having a TA removed in [place] by [facility].  Repeat colonoscopy was done and Lymphocytic colitis was noted by biopsy. She was prescribed budesonide 9 mg but never started the medication as her diarrhea has resolved.  Currently she averages 2 stools daily  first one usually formed,  second softer.   3) s/p right foot surgery by  [facility] surgeon Ygnacio Hemming Lukosious : .right foot lapidus procedure, 2nd hammertoe correction. on 02/19/2024    Her pain was controlled for 6 days with a popliteal block.  Has been  wearing the  offloader oot on week 5 .  Has a plate on top.   4) requesting  TB screening if medicare pays for it   5) tubular adenoma:  high grade dysplasia , one yr follow up advised   Outpatient Medications Prior to Visit  Medication Sig Dispense Refill   amLODipine  (NORVASC ) 5 MG tablet Take 1 tablet (5 mg total) by mouth daily. 90 tablet 3   aspirin EC 81 MG tablet Take 81 mg by mouth in the morning and at bedtime.     atorvastatin  (LIPITOR) 10 MG tablet Take 1 tablet (10 mg total) by mouth  daily. 90 tablet 3   EPINEPHrine  0.3 mg/0.3 mL IJ SOAJ injection USE AS DIRECTED 2 each 0   fluticasone (FLONASE) 50 MCG/ACT nasal spray Place 1 spray into both nostrils daily.     Loratadine (CLARITIN PO) Take by mouth.     Multiple Vitamin (MULTIVITAMIN) tablet Take by mouth.     No facility-administered medications prior to visit.    Review of Systems;  Patient denies headache, fevers, malaise, unintentional weight loss, skin rash, eye pain, sinus congestion and sinus pain, sore throat, dysphagia,  hemoptysis , cough, dyspnea, wheezing, chest pain, palpitations, orthopnea, edema, abdominal pain, nausea, melena, diarrhea, constipation, flank pain, dysuria, hematuria, urinary  Frequency, nocturia, numbness, tingling, seizures,  Focal weakness, Loss of consciousness,  Tremor, insomnia, depression, anxiety, and suicidal ideation.      Objective:  BP 116/72   Pulse 70   Ht 5\' 3"  (1.6 m)   Wt 137 lb (62.1 kg)   SpO2 96%   BMI 24.27 kg/m   BP Readings from Last 3 Encounters:  03/28/24 116/72  01/20/24 126/83  09/16/23 124/78    Wt Readings from Last 3 Encounters:  03/28/24 137 lb (62.1 kg)  01/20/24 137 lb 14.4 oz (62.6 kg)  09/16/23 142 lb (64.4 kg)    Physical Exam Vitals reviewed.  Constitutional:      General: She is not in acute distress.  Appearance: Normal appearance. She is normal weight. She is not ill-appearing, toxic-appearing or diaphoretic.  HENT:     Head: Normocephalic.  Eyes:     General: No scleral icterus.       Right eye: No discharge.        Left eye: No discharge.     Conjunctiva/sclera: Conjunctivae normal.  Cardiovascular:     Rate and Rhythm: Normal rate and regular rhythm.     Heart sounds: Normal heart sounds.  Pulmonary:     Effort: Pulmonary effort is normal. No respiratory distress.     Breath sounds: Normal breath sounds.  Musculoskeletal:        General: Normal range of motion.  Skin:    General: Skin is warm and dry.   Neurological:     General: No focal deficit present.     Mental Status: She is alert and oriented to person, place, and time. Mental status is at baseline.  Psychiatric:        Mood and Affect: Mood normal.        Behavior: Behavior normal.        Thought Content: Thought content normal.        Judgment: Judgment normal.    Lab Results  Component Value Date   HGBA1C 6.2 01/19/2023   HGBA1C 6.2 11/18/2021   HGBA1C 5.9 02/06/2021    Lab Results  Component Value Date   CREATININE 0.78 01/19/2023   CREATININE 0.74 11/18/2021   CREATININE 0.87 02/13/2021    Lab Results  Component Value Date   WBC 4.0 01/19/2023   HGB 13.7 01/19/2023   HCT 40.9 01/19/2023   PLT 440.0 (H) 01/19/2023   GLUCOSE 103 (H) 01/19/2023   CHOL 162 01/19/2023   TRIG 130.0 01/19/2023   HDL 52.40 01/19/2023   LDLDIRECT 86.0 01/19/2023   LDLCALC 83 01/19/2023   ALT 23 01/19/2023   AST 21 01/19/2023   NA 142 01/19/2023   K 3.4 (L) 01/19/2023   CL 105 01/19/2023   CREATININE 0.78 01/19/2023   BUN 17 01/19/2023   CO2 28 01/19/2023   TSH 0.66 01/19/2023   HGBA1C 6.2 01/19/2023   MICROALBUR 1.4 01/19/2023    MR FINGERS RICHT WO CONTRAST Result Date: 09/04/2020 CLINICAL DATA:  Right thumb mass present for greater than 1 year EXAM: MRI OF THE RIGHT FINGERS WITHOUT CONTRAST TECHNIQUE: Multiplanar, multisequence MR imaging of the right thumb was performed. No intravenous contrast was administered. COMPARISON:  None available FINDINGS: Bones/Joint/Cartilage No acute fracture. No dislocation. No bone marrow edema. No suspicious marrow replacing lesion. Mild osteoarthritis of the first MCP joint and interphalangeal joint of the thumb. No joint effusion. Ligaments Collateral ligaments of the thumb are intact. Muscles and Tendons Intact flexor and extensor tendons. No tenosynovitis. Normal muscle bulk and signal intensity. Soft tissues There is a solid T1/T2 heterogeneously hypointense mass within the volar soft  tissues of the right thumb adjacent to the ulnar aspect of the flexor tendon just proximal to the thumb IP joint. Mass measures 1.0 x 0.7 x 1.2 cm (series 12, image 11; series 9, image 16). No erosion to the underlying proximal phalanx. No additional soft tissue masses. No associated soft tissue edema. No cystic lesions or fluid collections. IMPRESSION: 1. Well-circumscribed 1.2 cm solid mass within the volar soft tissues of the right thumb adjacent to the flexor tendon just proximal to the thumb IP joint. Imaging characteristics are most suggestive of a giant cell tumor of the tendon sheath. 2.  Mild osteoarthritis of the first MCP joint and interphalangeal joint of the thumb. Electronically Signed   By: Leverne Reading D.O.   On: 09/04/2020 10:35    Assessment & Plan:  .Encounter for screening mammogram for malignant neoplasm of breast -     3D Screening Mammogram, Left and Right; Future  Essential hypertension, benign Assessment & Plan: Well controlled on current regimen. no changes today.   Orders: -     Comprehensive metabolic panel with GFR; Future -     Microalbumin / creatinine urine ratio; Future  Hyperlipidemia LDL goal <130 -     Lipid panel; Future -     LDL cholesterol, direct; Future  Prediabetes -     3D Screening Mammogram, Left and Right; Future -     Lipid panel; Future -     LDL cholesterol, direct; Future -     Comprehensive metabolic panel with GFR; Future -     Hemoglobin A1c; Future -     TSH; Future -     CBC with Differential/Platelet; Future -     Microalbumin / creatinine urine ratio; Future  Other fatigue -     TSH; Future -     CBC with Differential/Platelet; Future  Lymphocytic colitis  Positive ANA (antinuclear antibody) Assessment & Plan: She was referred to rheumatology for evaluation.  No evidence of autoimmune disease found.    Irritable bowel syndrome with diarrhea Assessment & Plan: Managed with dietary avoidance   History of colonic  polyps Assessment & Plan: Tubular adenoma with high grade dysplasia was removed in Feb 2025,  one year follow upcolonnoscopy is  planned       I spent on the day of this face to face encounter reviewing patient's  most recent visit with orthopedics,  gastroenterology and rheumatology,  recent relevant surgical and non surgical procedures, recent  labs and imaging studies,  reviewing the assessment and plan with patient, and post visit ordering and reviewing of  diagnostics and therapeutics with patient  .   Follow-up: Return in about 6 months (around 09/27/2024).   Thersia Flax, MD

## 2024-03-29 NOTE — Assessment & Plan Note (Signed)
 Managed with dietary avoidance

## 2024-03-29 NOTE — Assessment & Plan Note (Signed)
 She was referred to rheumatology for evaluation.  No evidence of autoimmune disease found.

## 2024-03-29 NOTE — Assessment & Plan Note (Signed)
 Tubular adenoma with high grade dysplasia was removed in Feb 2025,  one year follow upcolonnoscopy is  planned

## 2024-03-29 NOTE — Assessment & Plan Note (Signed)
Well controlled on current regimen.  no changes today.   

## 2024-04-07 ENCOUNTER — Other Ambulatory Visit

## 2024-04-08 ENCOUNTER — Other Ambulatory Visit (INDEPENDENT_AMBULATORY_CARE_PROVIDER_SITE_OTHER)

## 2024-04-08 DIAGNOSIS — E785 Hyperlipidemia, unspecified: Secondary | ICD-10-CM

## 2024-04-08 DIAGNOSIS — R7303 Prediabetes: Secondary | ICD-10-CM | POA: Diagnosis not present

## 2024-04-08 DIAGNOSIS — R5383 Other fatigue: Secondary | ICD-10-CM | POA: Diagnosis not present

## 2024-04-08 DIAGNOSIS — I10 Essential (primary) hypertension: Secondary | ICD-10-CM

## 2024-04-08 LAB — COMPREHENSIVE METABOLIC PANEL WITH GFR
ALT: 17 U/L (ref 0–35)
AST: 17 U/L (ref 0–37)
Albumin: 4.3 g/dL (ref 3.5–5.2)
Alkaline Phosphatase: 80 U/L (ref 39–117)
BUN: 15 mg/dL (ref 6–23)
CO2: 31 meq/L (ref 19–32)
Calcium: 9.1 mg/dL (ref 8.4–10.5)
Chloride: 103 meq/L (ref 96–112)
Creatinine, Ser: 0.73 mg/dL (ref 0.40–1.20)
GFR: 84.35 mL/min (ref >60.00)
Glucose, Bld: 102 mg/dL — ABNORMAL HIGH (ref 70–99)
Potassium: 3.6 meq/L (ref 3.5–5.1)
Sodium: 141 meq/L (ref 135–145)
Total Bilirubin: 0.7 mg/dL (ref 0.2–1.2)
Total Protein: 7.1 g/dL (ref 6.0–8.3)

## 2024-04-08 LAB — CBC WITH DIFFERENTIAL/PLATELET
Basophils Absolute: 0 10*3/uL (ref 0.0–0.1)
Basophils Relative: 0.8 % (ref 0.0–3.0)
Eosinophils Absolute: 0 10*3/uL (ref 0.0–0.7)
Eosinophils Relative: 1.4 % (ref 0.0–5.0)
HCT: 40.6 % (ref 36.0–46.0)
Hemoglobin: 13.5 g/dL (ref 12.0–15.0)
Lymphocytes Relative: 37.6 % (ref 12.0–46.0)
Lymphs Abs: 1.2 10*3/uL (ref 0.7–4.0)
MCHC: 33.3 g/dL (ref 30.0–36.0)
MCV: 93.6 fl (ref 78.0–100.0)
Monocytes Absolute: 0.5 10*3/uL (ref 0.1–1.0)
Monocytes Relative: 15.4 % — ABNORMAL HIGH (ref 3.0–12.0)
Neutro Abs: 1.5 10*3/uL (ref 1.4–7.7)
Neutrophils Relative %: 44.8 % (ref 43.0–77.0)
Platelets: 414 10*3/uL — ABNORMAL HIGH (ref 150.0–400.0)
RBC: 4.33 Mil/uL (ref 3.87–5.11)
RDW: 15 % (ref 11.5–15.5)
WBC: 3.3 10*3/uL — ABNORMAL LOW (ref 4.0–10.5)

## 2024-04-08 LAB — LIPID PANEL
Cholesterol: 181 mg/dL (ref 0–200)
HDL: 56.4 mg/dL (ref >39.00)
LDL Cholesterol: 112 mg/dL — ABNORMAL HIGH (ref 0–99)
NonHDL: 125.02
Total CHOL/HDL Ratio: 3
Triglycerides: 64 mg/dL (ref 0.0–149.0)
VLDL: 12.8 mg/dL (ref 0.0–40.0)

## 2024-04-08 LAB — HEMOGLOBIN A1C: Hgb A1c MFr Bld: 6 % (ref 4.6–6.5)

## 2024-04-08 LAB — MICROALBUMIN / CREATININE URINE RATIO
Creatinine,U: 133.7 mg/dL
Microalb Creat Ratio: 10.3 mg/g (ref 0.0–30.0)
Microalb, Ur: 1.4 mg/dL (ref 0.0–1.9)

## 2024-04-08 LAB — TSH: TSH: 1.19 u[IU]/mL (ref 0.35–5.50)

## 2024-04-08 LAB — LDL CHOLESTEROL, DIRECT: Direct LDL: 104 mg/dL

## 2024-04-10 ENCOUNTER — Encounter: Payer: Self-pay | Admitting: Internal Medicine

## 2024-07-15 ENCOUNTER — Other Ambulatory Visit: Payer: Self-pay

## 2024-07-15 MED ORDER — NIRMATRELVIR/RITONAVIR (PAXLOVID)TABLET: 3.0000 | ORAL_TABLET | Freq: Two times a day (BID) | ORAL | 0 refills | Status: AC

## 2024-07-15 NOTE — Telephone Encounter (Signed)
 Copied from CRM [id number]. Topic: Clinical - Medication Question >> Jul 15, 2024 11:02 AM Robinson H wrote: Reason for CRM: Patient is calling to see if she can switch to the Neffy  instead of the Epipen  and wants to know if she can have a prescription for Paxlovid  states she's about to travel and wants to have the Paxlovid  for preventative.  Alette [phone]

## 2024-07-15 NOTE — Addendum Note (Signed)
 Addended by: MARYLYNN VERNEITA CROME on: 07/15/2024 05:23 PM   Modules accepted: Orders

## 2024-07-15 NOTE — Telephone Encounter (Signed)
 Spoke with pt to let her know that Dr. Marylynn is out of the office until Tuesday. Pt stated that is fine she isn't leaving until the end of next week.

## 2024-07-18 NOTE — Telephone Encounter (Signed)
 These is the Neffy  that pt was talking about. She stated that it is not an injection it is a nasal spray. I have pended the 2 I found in epic.

## 2024-07-18 NOTE — Addendum Note (Signed)
 Addended by: Remi Lopata on: 07/18/2024 01:12 PM   Modules accepted: Orders

## 2024-07-19 ENCOUNTER — Encounter: Payer: Self-pay | Admitting: Internal Medicine

## 2024-07-19 DIAGNOSIS — Z87892 Personal history of anaphylaxis: Secondary | ICD-10-CM | POA: Insufficient documentation

## 2024-07-19 MED ORDER — NEFFY 2 MG/0.1ML NA SOLN
1.0000 | Freq: Once | NASAL | 2 refills | Status: DC | PRN
Start: 2024-07-19 — End: 2024-09-26

## 2024-07-19 MED ORDER — NEFFY 1 MG/0.1ML NA SOLN: 1.0000 | Freq: Once | NASAL | 2 refills | Status: DC | PRN

## 2024-07-26 ENCOUNTER — Telehealth: Payer: Self-pay | Admitting: *Deleted

## 2024-07-26 NOTE — Telephone Encounter (Signed)
 Patient called today and said she needs to cancel the appointment for this Wednesday with Dr. Mancil.  She will call back in the future to let us  know that she can get back in.

## 2024-07-27 ENCOUNTER — Inpatient Hospital Stay: Payer: Medicare Other

## 2024-09-02 LAB — HM MAMMOGRAPHY

## 2024-09-26 ENCOUNTER — Ambulatory Visit (INDEPENDENT_AMBULATORY_CARE_PROVIDER_SITE_OTHER): Admitting: Internal Medicine

## 2024-09-26 ENCOUNTER — Encounter: Payer: Self-pay | Admitting: Internal Medicine

## 2024-09-26 VITALS — BP 130/68 | HR 58 | Ht 63.0 in | Wt 140.2 lb

## 2024-09-26 DIAGNOSIS — R197 Diarrhea, unspecified: Secondary | ICD-10-CM | POA: Diagnosis not present

## 2024-09-26 DIAGNOSIS — K52832 Lymphocytic colitis: Secondary | ICD-10-CM

## 2024-09-26 DIAGNOSIS — N893 Dysplasia of vagina, unspecified: Secondary | ICD-10-CM

## 2024-09-26 MED ORDER — NEFFY 1 MG/0.1ML NA SOLN: 1.0000 | Freq: Once | NASAL | 2 refills | Status: AC | PRN

## 2024-09-26 MED ORDER — ATORVASTATIN CALCIUM 10 MG PO TABS: 10.0000 mg | ORAL_TABLET | Freq: Every day | ORAL | 3 refills | Status: AC

## 2024-09-26 MED ORDER — AMLODIPINE BESYLATE 5 MG PO TABS: 5.0000 mg | ORAL_TABLET | Freq: Every day | ORAL | 3 refills | Status: AC

## 2024-09-26 NOTE — Patient Instructions (Signed)
 I agree with starting the budesonide .  If the diarrhea persists,  we should check your electrolytes including magnesium) and I have ordered them

## 2024-09-26 NOTE — Progress Notes (Unsigned)
 Subjective:  Patient ID: Maria Kemp, female    DOB: 1956/05/03  Age: 68 y.o. MRN: [id number]  CC: There were no encounter diagnoses.   HPI Maria Kemp presents for  Chief Complaint  Patient presents with   Medical Management of Chronic Issues    6 month follow up     1) HTN: Hypertension: patient checks blood pressure twice weekly at home.  Readings have been for the most part <130/80 at rest . Patient is following a reduced salt diet most days and is taking medications as prescribed    2) s/p bunionectomy right foot march 2025 has not had PT  3) continued bowel issues (colitis) which quieted down for a few months after last colonoscopy when polyp was removed.  Having gas and explosive diarrhea . Has tried changing diet.  Drinks almond milk,  soy yogurt  Outpatient Medications Prior to Visit  Medication Sig Dispense Refill   amLODipine  (NORVASC ) 5 MG tablet Take 1 tablet (5 mg total) by mouth daily. 90 tablet 3   aspirin EC 81 MG tablet Take 81 mg by mouth in the morning and at bedtime.     atorvastatin  (LIPITOR) 10 MG tablet Take 1 tablet (10 mg total) by mouth daily. 90 tablet 3   EPINEPHrine  0.3 mg/0.3 mL IJ SOAJ injection USE AS DIRECTED 2 each 0   fluticasone (FLONASE) 50 MCG/ACT nasal spray Place 1 spray into both nostrils daily.     Loratadine (CLARITIN PO) Take by mouth.     Multiple Vitamin (MULTIVITAMIN) tablet Take by mouth.     EPINEPHrine  (NEFFY ) 1 MG/0.1ML SOLN Place 1 Squirt into the nose once as needed for up to 1 dose (anaphylaxis). (Patient not taking: Reported on 09/26/2024) 1 each 2   EPINEPHrine  (NEFFY ) 2 MG/0.1ML SOLN Place 1 Squirt into the nose once as needed for up to 1 dose (anaphylaxs). (Patient not taking: Reported on 09/26/2024) 1 each 2   calcium -vitamin D  (OSCAL WITH D) 500-200 MG-UNIT tablet Take 1 tablet by mouth daily with breakfast.     No facility-administered medications prior to visit.    Review of Systems;  Patient denies  headache, fevers, malaise, unintentional weight loss, skin rash, eye pain, sinus congestion and sinus pain, sore throat, dysphagia,  hemoptysis , cough, dyspnea, wheezing, chest pain, palpitations, orthopnea, edema, abdominal pain, nausea, melena, diarrhea, constipation, flank pain, dysuria, hematuria, urinary  Frequency, nocturia, numbness, tingling, seizures,  Focal weakness, Loss of consciousness,  Tremor, insomnia, depression, anxiety, and suicidal ideation.      Objective:  BP 130/68   Pulse (!) 58   Ht 5' 3 (1.6 m)   Wt 140 lb 3.2 oz (63.6 kg)   SpO2 96%   BMI 24.84 kg/m   BP Readings from Last 3 Encounters:  09/26/24 130/68  03/28/24 116/72  01/20/24 126/83    Wt Readings from Last 3 Encounters:  09/26/24 140 lb 3.2 oz (63.6 kg)  03/28/24 137 lb (62.1 kg)  01/20/24 137 lb 14.4 oz (62.6 kg)    Physical Exam  Lab Results  Component Value Date   HGBA1C 6.0 04/08/2024   HGBA1C 6.2 01/19/2023   HGBA1C 6.2 11/18/2021    Lab Results  Component Value Date   CREATININE 0.73 04/08/2024   CREATININE 0.78 01/19/2023   CREATININE 0.74 11/18/2021    Lab Results  Component Value Date   WBC 3.3 (L) 04/08/2024   HGB 13.5 04/08/2024   HCT 40.6 04/08/2024   PLT 414.0 (H)  04/08/2024   GLUCOSE 102 (H) 04/08/2024   CHOL 181 04/08/2024   TRIG 64.0 04/08/2024   HDL 56.40 04/08/2024   LDLDIRECT 104.0 04/08/2024   LDLCALC 112 (H) 04/08/2024   ALT 17 04/08/2024   AST 17 04/08/2024   NA 141 04/08/2024   K 3.6 04/08/2024   CL 103 04/08/2024   CREATININE 0.73 04/08/2024   BUN 15 04/08/2024   CO2 31 04/08/2024   TSH 1.19 04/08/2024   HGBA1C 6.0 04/08/2024   MICROALBUR 1.4 04/08/2024    MR FINGERS RICHT WO CONTRAST Result Date: 09/04/2020 CLINICAL DATA:  Right thumb mass present for greater than 1 year EXAM: MRI OF THE RIGHT FINGERS WITHOUT CONTRAST TECHNIQUE: Multiplanar, multisequence MR imaging of the right thumb was performed. No intravenous contrast was administered.  COMPARISON:  None available FINDINGS: Bones/Joint/Cartilage No acute fracture. No dislocation. No bone marrow edema. No suspicious marrow replacing lesion. Mild osteoarthritis of the first MCP joint and interphalangeal joint of the thumb. No joint effusion. Ligaments Collateral ligaments of the thumb are intact. Muscles and Tendons Intact flexor and extensor tendons. No tenosynovitis. Normal muscle bulk and signal intensity. Soft tissues There is a solid T1/T2 heterogeneously hypointense mass within the volar soft tissues of the right thumb adjacent to the ulnar aspect of the flexor tendon just proximal to the thumb IP joint. Mass measures 1.0 x 0.7 x 1.2 cm (series 12, image 11; series 9, image 16). No erosion to the underlying proximal phalanx. No additional soft tissue masses. No associated soft tissue edema. No cystic lesions or fluid collections. IMPRESSION: 1. Well-circumscribed 1.2 cm solid mass within the volar soft tissues of the right thumb adjacent to the flexor tendon just proximal to the thumb IP joint. Imaging characteristics are most suggestive of a giant cell tumor of the tendon sheath. 2. Mild osteoarthritis of the first MCP joint and interphalangeal joint of the thumb. Electronically Signed   By: Mabel Converse D.O.   On: 09/04/2020 10:35    Assessment & Plan:  .There are no diagnoses linked to this encounter.   I spent 34 minutes on the day of this face to face encounter reviewing patient's  most recent visit with cardiology,  nephrology,  and neurology,  prior relevant surgical and non surgical procedures, recent  labs and imaging studies, counseling on weight management,  reviewing the assessment and plan with patient, and post visit ordering and reviewing of  diagnostics and therapeutics with patient  .   Follow-up: No follow-ups on file.   Verneita LITTIE Kettering, MD

## 2024-09-27 NOTE — Assessment & Plan Note (Signed)
Managed by Dr Fransisca Connors.  Last visit In mid [place] reviewed. PAP and HPV were repeated  And she will  follow up in 6 months for history of low grade vaginal dysplasia and persistent  LGSIL PAP.  ?

## 2024-09-27 NOTE — Assessment & Plan Note (Signed)
 Symptoms have returned ; agree with start of budesonide

## 2024-10-13 ENCOUNTER — Other Ambulatory Visit

## 2024-10-17 ENCOUNTER — Other Ambulatory Visit

## 2024-10-19 ENCOUNTER — Inpatient Hospital Stay: Attending: Obstetrics and Gynecology | Admitting: Obstetrics and Gynecology

## 2024-10-19 ENCOUNTER — Ambulatory Visit

## 2024-10-19 VITALS — BP 117/57 | HR 80 | Temp 98.5°F | Ht 63.0 in | Wt 143.0 lb

## 2024-10-19 DIAGNOSIS — N893 Dysplasia of vagina, unspecified: Secondary | ICD-10-CM

## 2024-10-19 DIAGNOSIS — Z87411 Personal history of vaginal dysplasia: Secondary | ICD-10-CM | POA: Insufficient documentation

## 2024-10-19 DIAGNOSIS — Z1151 Encounter for screening for human papillomavirus (HPV): Secondary | ICD-10-CM | POA: Diagnosis not present

## 2024-10-19 DIAGNOSIS — Z124 Encounter for screening for malignant neoplasm of cervix: Secondary | ICD-10-CM | POA: Insufficient documentation

## 2024-10-19 NOTE — Progress Notes (Signed)
 Gynecologic Oncology Visit   Referring Provider: Dr. Fleeta Milks  PCP: Dr. Marylynn  Chief Concern: abnormal Pap- LSIL, HRHPV negative  Subjective:  Maria Kemp is a 68 y.o. female, with long history of slightly abnormal pap smears, primarily LGSIL, with HPV negative.    She denies gyn complaints. Not sexually active now, no discharge or bleeding.  No new complaints.  Last seen 2/25 with ASCUS US  PAP, HRHPV negative.   Gynecologic History:  Dr Fleeta Milks performed LEEP in 1999 and CKC in 2006 for cervical dysplasia.  Subsequent Pap smears vary between normal and LGSIL with high risk HPV.  Since 2013 her q6 month PAPs have shown ASCUS, Normal, LSIL, LSIL.  No HPV testing or colposcopy was done during that time period.  10/2014 she saw Dr. Mancil. Colposcopy was performed. The transformation zone was not visible, but there were no lesions. Pap NILM and HR-HPV negative.   She was seen by Dr Mancil 12/16 for colposcopy given LSIL cervical PAP, Vaginal Pap: ASCUS, negative HR HPV on 11/16. Biopsy of small cyst in the vagina just below the cervix was performed.  DIAGNOSIS:  A. VAGINA; BIOPSY:  - INFLAMED VAGINAL MUCOSA WITH A LOW GRADE SQUAMOUS INTRAEPITHELIAL LESION (VAIN 1) AND PARTIAL BENIGN CYST.   12/17- Pap/HPV normal  06/17/17- LSIL PAP/HPV Positive  03/17/2018 Colposcopy vagina and cervix, negative.  03/17/2018- Pap NILM, HRHPV negative; Colposcopy negative 05/25/2019- Pap LSIL, HR HPV negative.   02/09/2020 pelvic US  was obtained for unexplained endometrial cells on Pap smear.  IMPRESSION: 1. Endometrial stripe measures within normal limits at 2 mm in maximal thickness. 2. 5 mm simple cystic lesion seen close proximity to the proximal endometrial stripe, indeterminate, but could reflect a small subendometrial cyst versus small amount of trapped simple fluid within the endometrial cavity. 3. Normal right ovary. 4. Nonvisualization of the left ovary. No adnexal mass or free  fluid.   01/04/2020- Pap LSIL, HPV negative. Epithelial cell abnormality Her most recent pap 1/27/21showed LSIL and HPV negative. However, endometrial cells were present and we discussed that while this can be benign or due to polyp, endometrial abnormalities including endometrial hyperplasia and/or carcinoma can't be ruled out and Dr. Mancil recommended endometrial biopsy to further evaluation.    She had an episode of SVT on 06/09/2019 that responded to adenosine . She started maintenance metoprolol .   02/01/2020 Endometrial biopsy pathology revealed tissue insufficient for diagnosis.   01/16/21- Pap NILM, HPV negative.   03/26/22  PAP showed LSIL, HPV negative.  05/28/22- Colposcopy which was negative for lesions Pap 12/23 LGSIL/HRHPV negative. Colposcopy at her appt in June 2024 was negative and PAP LSIL/HRHPV negative     Her mother and father passed away. Her mother was diagnosed with carcinosarcoma.   Problem List: Patient Active Problem List   Diagnosis Date Noted   History of anaphylaxis 07/19/2024   Lymphocytic colitis 09/17/2023   Crushing injury of left hand and finger 09/17/2023   High total serum IgA 07/22/2023   Positive ANA (antinuclear antibody) 07/22/2023   Fingernail abnormalities 07/22/2023   Hallux valgus, right 03/16/2023   Hypokalemia 01/25/2023   Elevated anti-tissue transglutaminase (tTG) IgA level 01/25/2023   Prediabetes 02/07/2021   Atypical chest pain 02/05/2021   IBS (irritable bowel syndrome) 02/05/2021   Abnormal mammogram with microcalcification 01/29/2021   Encounter for preventive health examination 08/02/2020   Right shoulder injury, initial encounter 08/02/2020   Bilateral cataracts 10/12/2018   Cyst of joint of hand, right 10/12/2018   Breast  nodule 03/22/2017   Vaginal dysplasia 05/21/2016   Low grade squamous intraepithelial lesion on cytologic smear of cervix (LGSIL) 05/21/2016   History of colonic polyps 03/17/2016   H/O sebaceous cyst  09/06/2015   Hyperlipidemia LDL goal <130 09/06/2015   Lichen planus 09/04/2014   Essential hypertension, benign 09/04/2014   Family history of colonic polyps 09/04/2014   H/O rotator cuff tear 09/04/2014   Past Medical History: Past Medical History:  Diagnosis Date   Arthritis    hands   Colon polyps    Hyperlipidemia    Hypertension    PONV (postoperative nausea and vomiting)    Past Surgical History: Past Surgical History:  Procedure Laterality Date   ABDOMINOPLASTY  1995   CATARACT EXTRACTION, BILATERAL     CESAREAN SECTION  1989   COLONOSCOPY WITH PROPOFOL  N/A 11/29/2018   Procedure: COLONOSCOPY WITH PROPOFOL ;  Surgeon: Jinny Carmine, MD;  Location: [facility];  Service: Endoscopy;  Laterality: N/A;   LESION REMOVAL     Patient states she has had multiple done   POLYPECTOMY N/A 11/29/2018   Procedure: POLYPECTOMY;  Surgeon: Jinny Carmine, MD;  Location: [facility];  Service: Endoscopy;  Laterality: N/A;   Past Gynecologic History:  Menarche: 11 Menstrual details: postmenopausal  OB History: G3P2  Family History: Family History  Problem Relation Age of Onset   Arthritis Mother    Hyperlipidemia Mother    Hypertension Mother    Heart disease Mother    Uterine cancer Mother    Arthritis Father    Cancer Father    Hyperlipidemia Father    Hypertension Father    Heart disease Father    Diabetes Father    Hyperlipidemia Brother    Hypertension Brother    Social History: Social History   Socioeconomic History   Marital status: Divorced    Spouse name: Not on file   Number of children: Not on file   Years of education: Not on file   Highest education level: Professional school degree (e.g., MD, DDS, DVM, JD)  Occupational History   Not on file  Tobacco Use   Smoking status: Never    Passive exposure: Past   Smokeless tobacco: Never  Vaping Use   Vaping status: Never Used  Substance and Sexual Activity   Alcohol use: Yes    Comment:  rarely - 2-3x/yr   Drug use: No   Sexual activity: Not on file  Other Topics Concern   Not on file  Social History Narrative   Not on file   Social Drivers of Health   Financial Resource Strain: Low Risk  (03/15/2023)   Overall Financial Resource Strain (CARDIA)    Difficulty of Paying Living Expenses: Not hard at all  Food Insecurity: Unknown (03/15/2023)   Hunger Vital Sign    Worried About Running Out of Food in the Last Year: Not on file    Ran Out of Food in the Last Year: Never true  Transportation Needs: No Transportation Needs (03/15/2023)   PRAPARE - Administrator, Civil Service (Medical): No    Lack of Transportation (Non-Medical): No  Physical Activity: Insufficiently Active (03/15/2023)   Exercise Vital Sign    Days of Exercise per Week: 3 days    Minutes of Exercise per Session: 30 min  Stress: No Stress Concern Present (03/15/2023)   [employer] of Occupational Health - Occupational Stress Questionnaire    Feeling of Stress : Not at all  Social Connections: Unknown (  03/15/2023)   Social Connection and Isolation Panel    Frequency of Communication with Friends and Family: More than three times a week    Frequency of Social Gatherings with Friends and Family: Twice a week    Attends Religious Services: Patient declined    Database Administrator or Organizations: Yes    Attends Banker Meetings: 1 to 4 times per year    Marital Status: Divorced  Intimate Partner Violence: Not At Risk (05/20/2022)   Humiliation, Afraid, Rape, and Kick questionnaire    Fear of Current or Ex-Partner: No    Emotionally Abused: No    Physically Abused: No    Sexually Abused: No   Allergies: Allergies  Allergen Reactions   Codeine Anaphylaxis   Shrimp [Shellfish Allergy] Anaphylaxis    All shell fish   Current Medications: Current Outpatient Medications  Medication Sig Dispense Refill   amLODipine  (NORVASC ) 5 MG tablet Take 1 tablet (5 mg total) by mouth  daily. 90 tablet 3   aspirin EC 81 MG tablet Take 81 mg by mouth in the morning and at bedtime.     atorvastatin  (LIPITOR) 10 MG tablet Take 1 tablet (10 mg total) by mouth daily. 90 tablet 3   EPINEPHrine  (NEFFY ) 1 MG/0.1ML SOLN Place 1 Squirt into the nose once as needed for up to 1 dose (anaphylaxis). 1 each 2   EPINEPHrine  0.3 mg/0.3 mL IJ SOAJ injection USE AS DIRECTED 2 each 0   fluticasone (FLONASE) 50 MCG/ACT nasal spray Place 1 spray into both nostrils daily.     Loratadine (CLARITIN PO) Take by mouth.     Multiple Vitamin (MULTIVITAMIN) tablet Take by mouth.     No current facility-administered medications for this visit.   Review of Systems General:  no complaints Skin: no complaints Eyes: no complaints HEENT: no complaints Breasts: no complaints Pulmonary: no complaints Cardiac: no complaints Gastrointestinal: diarrhea Genitourinary/Sexual: no complaints Ob/Gyn: no complaints Musculoskeletal: no complaints Hematology: no complaints Neurologic/Psych: no complaints  Objective:  Physical Examination:  There were no vitals filed for this visit.   GENERAL: Patient is a well appearing female in no acute distress HEENT:  Atraumatic and normocephalic.  ABDOMEN:  Soft, nontender. Nondistended. No masses/ascites EXTREMITIES:  No peripheral edema.   NEURO:  Nonfocal. Well oriented.  Appropriate affect.  Pelvic: EGBUS: no lesions Cervix: Pap obtained. Cervical os is stenotic. Vagina: no lesions, no discharge or bleeding. Atropic with some spotting on PAP.   Uterus: normal size, nontender, mobile Adnexa: no palpable masses Rectovaginal: deferred   Assessment:  Maria Kemp is a 68 y.o. 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 Cervical os stenosis. LSIL Pap smear with positive HPV 06/17/2017. Negative colposcopy with negative Pap/HRHPV 4/19 and 6/20. 01/04/2020- Pap LSIL, HPV negative. Epithelial cell  abnormality. Endometrial cells noted on PAP and this is abnormal in a post menopausal woman.  Endometrial biopsy, insufficient tissue. Pap 01/16/21 NILM, HPV negative. Presented 10/22 with vaginal burning and dysuria. UA from PCP revealed yeast. Exam consistent with mild atrophy.  PAP 03/26/22 showed LSIL, HPV negative. 05/2022 colposcopy negative.   Pap 12/23 LGSIL/HRHPV negative. Colposcopy at her appt in June 2024 was negative and PAP LSIL/HRHPV negative, 2/25 ASCUS US  and HPV negative.    Normal exam today.   Family history of carcinosarcoma, mother in her 96's  Plan:   Problem List Items Addressed This Visit  Genitourinary   Vaginal dysplasia - Primary    No lesions seen today and PAP low grade and HPV negative with negative colposcopy 6/24.  Repeated PAP/HPV today. She will follow up in 12 months for history of low grade vaginal dysplasia and LSIL PAP or sooner if needed.   Prentice Agent, MD

## 2024-10-22 LAB — IGP, APTIMA HPV: HPV Aptima: NEGATIVE

## 2024-10-26 ENCOUNTER — Ambulatory Visit: Payer: Self-pay | Admitting: Obstetrics and Gynecology

## 2024-11-11 ENCOUNTER — Ambulatory Visit

## 2024-11-18 ENCOUNTER — Other Ambulatory Visit

## 2024-11-18 DIAGNOSIS — R197 Diarrhea, unspecified: Secondary | ICD-10-CM

## 2024-11-18 LAB — COMPREHENSIVE METABOLIC PANEL WITH GFR
ALT: 22 U/L (ref 0–35)
AST: 19 U/L (ref 0–37)
Albumin: 4.7 g/dL (ref 3.5–5.2)
Alkaline Phosphatase: 76 U/L (ref 39–117)
BUN: 11 mg/dL (ref 6–23)
CO2: 30 meq/L (ref 19–32)
Calcium: 9.6 mg/dL (ref 8.4–10.5)
Chloride: 101 meq/L (ref 96–112)
Creatinine, Ser: 0.85 mg/dL (ref 0.40–1.20)
GFR: 69.97 mL/min (ref >60.00)
Glucose, Bld: 99 mg/dL (ref 70–99)
Potassium: 3.8 meq/L (ref 3.5–5.1)
Sodium: 140 meq/L (ref 135–145)
Total Bilirubin: 0.7 mg/dL (ref 0.2–1.2)
Total Protein: 7.2 g/dL (ref 6.0–8.3)

## 2024-11-18 LAB — MAGNESIUM: Magnesium: 2.2 mg/dL (ref 1.5–2.5)

## 2024-11-20 ENCOUNTER — Encounter: Payer: Self-pay | Admitting: Internal Medicine

## 2024-11-20 ENCOUNTER — Ambulatory Visit: Payer: Self-pay | Admitting: Internal Medicine

## 2024-11-23 ENCOUNTER — Encounter: Payer: Self-pay | Admitting: Internal Medicine

## 2024-11-23 NOTE — Telephone Encounter (Signed)
 Noted

## 2024-12-27 ENCOUNTER — Ambulatory Visit: Admitting: *Deleted

## 2024-12-27 VITALS — Ht 63.0 in | Wt 137.0 lb

## 2024-12-27 DIAGNOSIS — Z0000 Encounter for general adult medical examination without abnormal findings: Secondary | ICD-10-CM

## 2024-12-27 NOTE — Patient Instructions (Addendum)
 Maria Kemp,  Thank you for taking the time for your Medicare Wellness Visit. I appreciate your continued commitment to your health goals. Please review the care plan we discussed, and feel free to reach out if I can assist you further.  Please note that Annual Wellness Visits do not include a physical exam. Some assessments may be limited, especially if the visit was conducted virtually. If needed, we may recommend an in-person follow-up with your provider.  Ongoing Care Seeing your primary care provider every 3 to 6 months helps us  monitor your health and provide consistent, personalized care.  Make sure you update your pneumonia vaccine which can be done at the office. Remember to call and schedule your repeat colonoscopy.  Referrals If a referral was made during today's visit and you haven't received any updates within two weeks, please contact the referred provider directly to check on the status.  Recommended Screenings:  Health Maintenance  Topic Date Due   Pneumococcal Vaccine for age over 5 (1 of 1 - PCV) 08/07/2005   Colon Cancer Screening  02/02/2025   Breast Cancer Screening  03/02/2025   COVID-19 Vaccine (10 - 2025-26 season) 03/10/2025   Medicare Annual Wellness Visit  12/27/2025   DTaP/Tdap/Td vaccine (4 - Td or Tdap) 09/15/2033   Flu Shot  Completed   Osteoporosis screening with Bone Density Scan  Completed   Hepatitis C Screening  Completed   Zoster (Shingles) Vaccine  Completed   Meningitis B Vaccine  Aged Out       12/27/2024    1:07 PM  Advanced Directives  Does Patient Have a Medical Advance Directive? Yes  Type of Estate Agent of [place];Living will  Does patient want to make changes to medical advance directive? No - Patient declined  Copy of Healthcare Power of Attorney in Chart? No - copy requested    Vision: Annual vision screenings are recommended for early detection of glaucoma, cataracts, and diabetic retinopathy. These exams  can also reveal signs of chronic conditions such as diabetes and high blood pressure.  Dental: Annual dental screenings help detect early signs of oral cancer, gum disease, and other conditions linked to overall health, including heart disease and diabetes.  Please see the attached documents for additional preventive care recommendations.

## 2024-12-27 NOTE — Progress Notes (Signed)
 "  Chief Complaint  Patient presents with   Medicare Wellness     Subjective:   Maria Kemp is a 69 y.o. female who presents for a Medicare Annual Wellness Visit.  Visit info / Clinical Intake: Medicare Wellness Visit Type:: Subsequent Annual Wellness Visit Persons participating in visit and providing information:: patient Medicare Wellness Visit Mode:: Video Since this visit was completed virtually, some vitals may be partially provided or unavailable. Missing vitals are due to the limitations of the virtual format.: Unable to obtain vitals - no equipment If Telephone or Video please confirm:: I connected with patient using audio/video enable telemedicine. I verified patient identity with two identifiers, discussed telehealth limitations, and patient agreed to proceed. Patient Location:: Work Dispensing Optician:: Office/Home Interpreter Needed?: No Pre-visit prep was completed: yes AWV questionnaire completed by patient prior to visit?: yes Date:: 12/26/24 Living arrangements:: (!) lives alone Patient's Overall Health Status Rating: very good Typical amount of pain: none Does pain affect daily life?: no Are you currently prescribed opioids?: no  Dietary Habits and Nutritional Risks How many meals a day?: 3 Eats fruit and vegetables daily?: yes Most meals are obtained by: preparing own meals In the last 2 weeks, have you had any of the following?: none Diabetic:: no  Functional Status Activities of Daily Living (to include ambulation/medication): Independent Ambulation: Independent Medication Administration: Independent Home Management (perform basic housework or laundry): Independent Manage your own finances?: yes Primary transportation is: driving Concerns about vision?: no *vision screening is required for WTM* Concerns about hearing?: no  Fall Screening Falls in the past year?: 0 Number of falls in past year: 0 Was there an injury with Fall?: 0 Fall Risk Category  Calculator: 0 Patient Fall Risk Level: Low Fall Risk  Fall Risk Patient at Risk for Falls Due to: No Fall Risks Fall risk Follow up: Falls evaluation completed; Falls prevention discussed  Home and Transportation Safety: All rugs have non-skid backing?: yes All stairs or steps have railings?: yes Grab bars in the bathtub or shower?: (!) no Have non-skid surface in bathtub or shower?: yes Good home lighting?: yes Regular seat belt use?: yes Hospital stays in the last year:: no  Cognitive Assessment Difficulty concentrating, remembering, or making decisions? : no Will 6CIT or Mini Cog be Completed: yes What year is it?: 0 points What month is it?: 0 points Give patient an address phrase to remember (5 components): [address] [place] About what time is it?: 0 points Count backwards from 20 to 1: 0 points Say the months of the year in reverse: 0 points Repeat the address phrase from earlier: 0 points 6 CIT Score: 0 points  Advance Directives (For Healthcare) Does Patient Have a Medical Advance Directive?: Yes Does patient want to make changes to medical advance directive?: No - Patient declined Type of Advance Directive: Healthcare Power of [place]; Living will Copy of Healthcare Power of Attorney in Chart?: No - copy requested Copy of Living Will in Chart?: No - copy requested Would patient like information on creating a medical advance directive?: No - Patient declined  Reviewed/Updated  Reviewed/Updated: Reviewed All (Medical, Surgical, Family, Medications, Allergies, Care Teams, Patient Goals)    Allergies (verified) Codeine and Shrimp [shellfish allergy]   Current Medications (verified) Outpatient Encounter Medications as of 12/27/2024  Medication Sig   amLODipine  (NORVASC ) 5 MG tablet Take 1 tablet (5 mg total) by mouth daily.   atorvastatin  (LIPITOR) 10 MG tablet Take 1 tablet (10 mg total) by  mouth daily.   EPINEPHrine  0.3 mg/0.3 mL IJ SOAJ injection USE  AS DIRECTED   fluticasone (FLONASE) 50 MCG/ACT nasal spray Place 1 spray into both nostrils daily.   Loratadine (CLARITIN PO) Take by mouth.   Multiple Vitamin (MULTIVITAMIN) tablet Take by mouth.   EPINEPHrine  (NEFFY ) 1 MG/0.1ML SOLN Place 1 Squirt into the nose once as needed for up to 1 dose (anaphylaxis). (Patient not taking: Reported on 12/27/2024)   No facility-administered encounter medications on file as of 12/27/2024.    History: Past Medical History:  Diagnosis Date   Arthritis    hands   Colon polyps    Hyperlipidemia    Hypertension    PONV (postoperative nausea and vomiting)    Past Surgical History:  Procedure Laterality Date   ABDOMINOPLASTY  1995   CATARACT EXTRACTION, BILATERAL     CESAREAN SECTION  1989   COLONOSCOPY WITH PROPOFOL  N/A 11/29/2018   Procedure: COLONOSCOPY WITH PROPOFOL ;  Surgeon: Jinny Carmine, MD;  Location[facility];  Service: Endoscopy;  Laterality: N/A;   FOOT SURGERY Right 02/2024   LESION REMOVAL     Patient states she has had multiple done   POLYPECTOMY N/A 11/29/2018   Procedure: POLYPECTOMY;  Surgeon: Jinny Carmine, MD;  Location: [facility];  Service: Endoscopy;  Laterality: N/A;   Family History  Problem Relation Age of Onset   Arthritis Mother    Hyperlipidemia Mother    Hypertension Mother    Heart disease Mother    Uterine cancer Mother    Arthritis Father    Cancer Father    Hyperlipidemia Father    Hypertension Father    Heart disease Father    Diabetes Father    Hyperlipidemia Brother    Hypertension Brother    Social History   Occupational History   Not on file  Tobacco Use   Smoking status: Never    Passive exposure: Past   Smokeless tobacco: Never  Vaping Use   Vaping status: Never Used  Substance and Sexual Activity   Alcohol use: Yes    Comment: rarely - 2-3x/yr   Drug use: No   Sexual activity: Not on file   Tobacco Counseling Counseling given: Not Answered  SDOH Screenings    Food Insecurity: No Food Insecurity (12/26/2024)  Housing: Low Risk (12/26/2024)  Transportation Needs: No Transportation Needs (12/26/2024)  Utilities: Not At Risk (12/27/2024)  Alcohol Screen: Low Risk (12/26/2024)  Depression (PHQ2-9): Low Risk (12/27/2024)  Financial Resource Strain: Low Risk (12/26/2024)  Physical Activity: Insufficiently Active (12/26/2024)  Social Connections: Moderately Integrated (12/26/2024)  Stress: No Stress Concern Present (12/26/2024)  Tobacco Use: Low Risk (12/27/2024)  Health Literacy: Adequate Health Literacy (12/27/2024)   See flowsheets for full screening details  Depression Screen PHQ 2 & 9 Depression Scale- Over the past 2 weeks, how often have you been bothered by any of the following problems? Little interest or pleasure in doing things: 0 Feeling down, depressed, or hopeless (PHQ Adolescent also includes...irritable): 0 PHQ-2 Total Score: 0 Trouble falling or staying asleep, or sleeping too much: 0 Feeling tired or having little energy: 0 Poor appetite or overeating (PHQ Adolescent also includes...weight loss): 0 Feeling bad about yourself - or that you are a failure or have let yourself or your family down: 0 Trouble concentrating on things, such as reading the newspaper or watching television (PHQ Adolescent also includes...like school work): 0 Moving or speaking so slowly that other people could have noticed. Or the opposite -  being so fidgety or restless that you have been moving around a lot more than usual: 0 Thoughts that you would be better off dead, or of hurting yourself in some way: 0 PHQ-9 Total Score: 0 If you checked off any problems, how difficult have these problems made it for you to do your work, take care of things at home, or get along with other people?: Not difficult at all     Goals Addressed             This Visit's Progress    Patient Stated       Exercise more             Objective:    Today's Vitals    12/27/24 1258  Weight: 137 lb (62.1 kg)  Height: 5' 3 (1.6 m)   Body mass index is 24.27 kg/m.  Hearing/Vision screen Hearing Screening - Comments:: No issues Vision Screening - Comments:: Glasses, [facility], up to date Immunizations and Health Maintenance Health Maintenance  Topic Date Due   Pneumococcal Vaccine: 50+ Years (1 of 1 - PCV) 08/07/2005   Colonoscopy  02/02/2025   Mammogram  03/02/2025   COVID-19 Vaccine (10 - 2025-26 season) 03/10/2025   Medicare Annual Wellness (AWV)  12/27/2025   DTaP/Tdap/Td (4 - Td or Tdap) 09/15/2033   Influenza Vaccine  Completed   Bone Density Scan  Completed   Hepatitis C Screening  Completed   Zoster Vaccines- Shingrix  Completed   Meningococcal B Vaccine  Aged Out        Assessment/Plan:  This is a routine wellness examination for Arvie.  Patient Care Team: Marylynn Verneita CROME, MD as PCP - General (Internal Medicine) Gean Toribio LABOR, MD as Referring Physician (Gastroenterology) Mancil Barter, MD as Referring Physician (Obstetrics)  I have personally reviewed and noted the following in the patients chart:   Medical and social history Use of alcohol, tobacco or illicit drugs  Current medications and supplements including opioid prescriptions. Functional ability and status Nutritional status Physical activity Advanced directives List of other physicians Hospitalizations, surgeries, and ER visits in previous 12 months Vitals Screenings to include cognitive, depression, and falls Referrals and appointments  No orders of the defined types were placed in this encounter.  In addition, I have reviewed and discussed with patient certain preventive protocols, quality metrics, and best practice recommendations. A written personalized care plan for preventive services as well as general preventive health recommendations were provided to patient.   Angeline Fredericks, LPN   [id number]/79/7973   Return in 1 year (on 12/27/2025).  After Visit  Summary: (MyChart) Due to this being a telephonic visit, the after visit summary with patients personalized plan was offered to patient via MyChart   Nurse Notes: Discussed the need to update pneumonia vaccine which may get at next  office visit.  Patient is in the process of reaching out to her GI doctor regarding a repeat colonoscopy.  "

## 2025-03-27 ENCOUNTER — Ambulatory Visit: Admitting: Internal Medicine

## 2025-10-18 ENCOUNTER — Inpatient Hospital Stay

## 2026-01-01 ENCOUNTER — Ambulatory Visit
